# Patient Record
Sex: Male | Born: 1935 | Race: White | Hispanic: No | Smoking: Former smoker
Health system: Southern US, Community
[De-identification: ages and names within clinical notes are randomized; demographics above are authoritative.]

## PROBLEM LIST (undated history)

## (undated) DIAGNOSIS — IMO0002 Reserved for concepts with insufficient information to code with codable children: Secondary | ICD-10-CM

## (undated) DIAGNOSIS — I679 Cerebrovascular disease, unspecified: Secondary | ICD-10-CM

## (undated) DIAGNOSIS — I255 Ischemic cardiomyopathy: Secondary | ICD-10-CM

## (undated) DIAGNOSIS — E785 Hyperlipidemia, unspecified: Secondary | ICD-10-CM

## (undated) DIAGNOSIS — C8330 Diffuse large B-cell lymphoma, unspecified site: Secondary | ICD-10-CM

## (undated) DIAGNOSIS — F0390 Unspecified dementia without behavioral disturbance: Secondary | ICD-10-CM

## (undated) DIAGNOSIS — E7800 Pure hypercholesterolemia, unspecified: Secondary | ICD-10-CM

## (undated) DIAGNOSIS — I5022 Chronic systolic (congestive) heart failure: Secondary | ICD-10-CM

## (undated) DIAGNOSIS — R7301 Impaired fasting glucose: Secondary | ICD-10-CM

## (undated) DIAGNOSIS — F17201 Nicotine dependence, unspecified, in remission: Secondary | ICD-10-CM

## (undated) DIAGNOSIS — I739 Peripheral vascular disease, unspecified: Secondary | ICD-10-CM

## (undated) DIAGNOSIS — I119 Hypertensive heart disease without heart failure: Secondary | ICD-10-CM

## (undated) DIAGNOSIS — J449 Chronic obstructive pulmonary disease, unspecified: Secondary | ICD-10-CM

## (undated) DIAGNOSIS — M5030 Other cervical disc degeneration, unspecified cervical region: Secondary | ICD-10-CM

## (undated) DIAGNOSIS — I4891 Unspecified atrial fibrillation: Secondary | ICD-10-CM

## (undated) DIAGNOSIS — S72001A Fracture of unspecified part of neck of right femur, initial encounter for closed fracture: Secondary | ICD-10-CM

## (undated) DIAGNOSIS — I2510 Atherosclerotic heart disease of native coronary artery without angina pectoris: Secondary | ICD-10-CM

## (undated) DIAGNOSIS — M791 Myalgia, unspecified site: Secondary | ICD-10-CM

## (undated) HISTORY — DX: Diffuse large B-cell lymphoma, unspecified site: C83.30

## (undated) HISTORY — DX: Peripheral vascular disease, unspecified: I73.9

## (undated) HISTORY — DX: Impaired fasting glucose: R73.01

## (undated) HISTORY — PX: MOHS SURGERY: SHX181

## (undated) HISTORY — DX: Hyperlipidemia, unspecified: E78.5

## (undated) HISTORY — DX: Cerebrovascular disease, unspecified: I67.9

## (undated) HISTORY — DX: Myalgia, unspecified site: M79.10

## (undated) HISTORY — DX: Other cervical disc degeneration, unspecified cervical region: M50.30

## (undated) HISTORY — PX: OTHER SURGICAL HISTORY: SHX169

## (undated) HISTORY — DX: Unspecified dementia, unspecified severity, without behavioral disturbance, psychotic disturbance, mood disturbance, and anxiety: F03.90

## (undated) HISTORY — DX: Atherosclerotic heart disease of native coronary artery without angina pectoris: I25.10

## (undated) HISTORY — DX: Chronic obstructive pulmonary disease, unspecified: J44.9

## (undated) HISTORY — DX: Unspecified atrial fibrillation: I48.91

## (undated) HISTORY — DX: Reserved for concepts with insufficient information to code with codable children: IMO0002

## (undated) HISTORY — DX: Nicotine dependence, unspecified, in remission: F17.201

---

## 1994-06-12 HISTORY — PX: INGUINAL HERNIA REPAIR: SHX194

## 1994-06-12 HISTORY — PX: ORIF FEMUR FRACTURE: SHX2119

## 1998-12-21 ENCOUNTER — Encounter: Payer: Self-pay | Admitting: Neurological Surgery

## 1998-12-21 ENCOUNTER — Ambulatory Visit (HOSPITAL_COMMUNITY): Admission: RE | Admit: 1998-12-21 | Discharge: 1998-12-21 | Payer: Self-pay | Admitting: Neurological Surgery

## 2000-09-29 ENCOUNTER — Emergency Department (HOSPITAL_COMMUNITY): Admission: EM | Admit: 2000-09-29 | Discharge: 2000-09-29 | Payer: Self-pay | Admitting: Emergency Medicine

## 2000-09-29 ENCOUNTER — Encounter: Payer: Self-pay | Admitting: Internal Medicine

## 2000-10-01 ENCOUNTER — Ambulatory Visit (HOSPITAL_COMMUNITY): Admission: RE | Admit: 2000-10-01 | Discharge: 2000-10-01 | Payer: Self-pay | Admitting: Internal Medicine

## 2000-10-01 ENCOUNTER — Encounter: Payer: Self-pay | Admitting: Internal Medicine

## 2000-10-02 ENCOUNTER — Encounter: Payer: Self-pay | Admitting: Internal Medicine

## 2000-10-02 ENCOUNTER — Ambulatory Visit (HOSPITAL_COMMUNITY): Admission: RE | Admit: 2000-10-02 | Discharge: 2000-10-02 | Payer: Self-pay | Admitting: Internal Medicine

## 2000-10-18 ENCOUNTER — Encounter: Payer: Self-pay | Admitting: Internal Medicine

## 2000-10-18 ENCOUNTER — Ambulatory Visit (HOSPITAL_COMMUNITY): Admission: RE | Admit: 2000-10-18 | Discharge: 2000-10-18 | Payer: Self-pay | Admitting: Internal Medicine

## 2001-02-26 ENCOUNTER — Ambulatory Visit (HOSPITAL_COMMUNITY): Admission: RE | Admit: 2001-02-26 | Discharge: 2001-02-26 | Payer: Self-pay | Admitting: Family Medicine

## 2001-02-26 ENCOUNTER — Encounter: Payer: Self-pay | Admitting: Family Medicine

## 2001-03-28 ENCOUNTER — Emergency Department (HOSPITAL_COMMUNITY): Admission: EM | Admit: 2001-03-28 | Discharge: 2001-03-29 | Payer: Self-pay | Admitting: Internal Medicine

## 2001-03-28 ENCOUNTER — Encounter: Payer: Self-pay | Admitting: Internal Medicine

## 2001-03-29 ENCOUNTER — Encounter: Payer: Self-pay | Admitting: Internal Medicine

## 2001-04-16 ENCOUNTER — Emergency Department (HOSPITAL_COMMUNITY): Admission: EM | Admit: 2001-04-16 | Discharge: 2001-04-16 | Payer: Self-pay | Admitting: Emergency Medicine

## 2001-05-21 ENCOUNTER — Ambulatory Visit (HOSPITAL_COMMUNITY): Admission: RE | Admit: 2001-05-21 | Discharge: 2001-05-21 | Payer: Self-pay | Admitting: Cardiology

## 2001-06-12 HISTORY — PX: ANTERIOR FUSION CERVICAL SPINE: SUR626

## 2001-06-21 ENCOUNTER — Ambulatory Visit (HOSPITAL_COMMUNITY): Admission: RE | Admit: 2001-06-21 | Discharge: 2001-06-21 | Payer: Self-pay | Admitting: *Deleted

## 2001-08-02 ENCOUNTER — Encounter: Payer: Self-pay | Admitting: Neurosurgery

## 2001-08-02 ENCOUNTER — Inpatient Hospital Stay (HOSPITAL_COMMUNITY): Admission: RE | Admit: 2001-08-02 | Discharge: 2001-08-06 | Payer: Self-pay | Admitting: Neurosurgery

## 2001-10-14 ENCOUNTER — Emergency Department (HOSPITAL_COMMUNITY): Admission: EM | Admit: 2001-10-14 | Discharge: 2001-10-15 | Payer: Self-pay | Admitting: *Deleted

## 2001-10-14 ENCOUNTER — Encounter: Payer: Self-pay | Admitting: *Deleted

## 2001-10-18 ENCOUNTER — Ambulatory Visit (HOSPITAL_COMMUNITY): Admission: RE | Admit: 2001-10-18 | Discharge: 2001-10-18 | Payer: Self-pay | Admitting: Internal Medicine

## 2001-10-18 ENCOUNTER — Encounter: Payer: Self-pay | Admitting: Internal Medicine

## 2001-10-29 ENCOUNTER — Ambulatory Visit (HOSPITAL_COMMUNITY): Admission: RE | Admit: 2001-10-29 | Discharge: 2001-10-29 | Payer: Self-pay | Admitting: Internal Medicine

## 2001-11-20 ENCOUNTER — Encounter (HOSPITAL_COMMUNITY): Admission: RE | Admit: 2001-11-20 | Discharge: 2001-12-20 | Payer: Self-pay | Admitting: Neurology

## 2001-12-20 ENCOUNTER — Ambulatory Visit (HOSPITAL_COMMUNITY): Admission: RE | Admit: 2001-12-20 | Discharge: 2001-12-20 | Payer: Self-pay | Admitting: Neurology

## 2004-07-01 ENCOUNTER — Ambulatory Visit: Payer: Self-pay | Admitting: Cardiology

## 2004-11-17 ENCOUNTER — Ambulatory Visit (HOSPITAL_COMMUNITY): Admission: RE | Admit: 2004-11-17 | Discharge: 2004-11-17 | Payer: Self-pay | Admitting: Family Medicine

## 2004-12-27 ENCOUNTER — Ambulatory Visit (HOSPITAL_COMMUNITY): Admission: RE | Admit: 2004-12-27 | Discharge: 2004-12-27 | Payer: Self-pay | Admitting: Family Medicine

## 2005-01-09 ENCOUNTER — Ambulatory Visit (HOSPITAL_COMMUNITY): Admission: RE | Admit: 2005-01-09 | Discharge: 2005-01-09 | Payer: Self-pay | Admitting: Family Medicine

## 2005-01-10 ENCOUNTER — Ambulatory Visit (HOSPITAL_COMMUNITY): Payer: Self-pay | Admitting: Oncology

## 2005-01-10 ENCOUNTER — Encounter (HOSPITAL_COMMUNITY): Admission: RE | Admit: 2005-01-10 | Discharge: 2005-02-09 | Payer: Self-pay | Admitting: Oncology

## 2005-01-10 ENCOUNTER — Encounter: Admission: RE | Admit: 2005-01-10 | Discharge: 2005-01-10 | Payer: Self-pay | Admitting: Oncology

## 2005-01-13 ENCOUNTER — Encounter (HOSPITAL_COMMUNITY): Admission: RE | Admit: 2005-01-13 | Discharge: 2005-01-14 | Payer: Self-pay | Admitting: General Surgery

## 2005-01-13 ENCOUNTER — Ambulatory Visit (HOSPITAL_COMMUNITY): Payer: Self-pay | Admitting: General Surgery

## 2005-01-16 ENCOUNTER — Ambulatory Visit (HOSPITAL_COMMUNITY): Admission: RE | Admit: 2005-01-16 | Discharge: 2005-01-16 | Payer: Self-pay | Admitting: Oncology

## 2005-01-16 ENCOUNTER — Ambulatory Visit (HOSPITAL_COMMUNITY): Admission: RE | Admit: 2005-01-16 | Discharge: 2005-01-16 | Payer: Self-pay | Admitting: General Surgery

## 2005-01-16 ENCOUNTER — Encounter (INDEPENDENT_AMBULATORY_CARE_PROVIDER_SITE_OTHER): Payer: Self-pay | Admitting: General Surgery

## 2005-01-17 ENCOUNTER — Inpatient Hospital Stay (HOSPITAL_COMMUNITY): Admission: EM | Admit: 2005-01-17 | Discharge: 2005-01-24 | Payer: Self-pay | Admitting: Emergency Medicine

## 2005-02-05 ENCOUNTER — Inpatient Hospital Stay (HOSPITAL_COMMUNITY): Admission: EM | Admit: 2005-02-05 | Discharge: 2005-02-08 | Payer: Self-pay | Admitting: Emergency Medicine

## 2005-02-07 ENCOUNTER — Ambulatory Visit: Payer: Self-pay | Admitting: Oncology

## 2005-02-20 ENCOUNTER — Encounter: Admission: RE | Admit: 2005-02-20 | Discharge: 2005-03-11 | Payer: Self-pay | Admitting: Oncology

## 2005-02-20 ENCOUNTER — Encounter (HOSPITAL_COMMUNITY): Admission: RE | Admit: 2005-02-20 | Discharge: 2005-03-11 | Payer: Self-pay | Admitting: Oncology

## 2005-03-07 ENCOUNTER — Ambulatory Visit (HOSPITAL_COMMUNITY): Payer: Self-pay | Admitting: Oncology

## 2005-03-13 ENCOUNTER — Encounter: Admission: RE | Admit: 2005-03-13 | Discharge: 2005-03-13 | Payer: Self-pay | Admitting: Oncology

## 2005-03-13 ENCOUNTER — Encounter (HOSPITAL_COMMUNITY): Admission: RE | Admit: 2005-03-13 | Discharge: 2005-04-12 | Payer: Self-pay | Admitting: Oncology

## 2005-03-21 ENCOUNTER — Ambulatory Visit (HOSPITAL_COMMUNITY): Admission: RE | Admit: 2005-03-21 | Discharge: 2005-03-21 | Payer: Self-pay | Admitting: Oncology

## 2005-03-28 ENCOUNTER — Encounter (HOSPITAL_COMMUNITY): Payer: Self-pay | Admitting: Oncology

## 2005-04-18 ENCOUNTER — Encounter (HOSPITAL_COMMUNITY): Admission: RE | Admit: 2005-04-18 | Discharge: 2005-05-18 | Payer: Self-pay | Admitting: Oncology

## 2005-04-18 ENCOUNTER — Encounter: Admission: RE | Admit: 2005-04-18 | Discharge: 2005-04-18 | Payer: Self-pay | Admitting: Oncology

## 2005-04-24 ENCOUNTER — Ambulatory Visit (HOSPITAL_COMMUNITY): Payer: Self-pay | Admitting: Oncology

## 2005-05-02 ENCOUNTER — Inpatient Hospital Stay (HOSPITAL_COMMUNITY): Admission: EM | Admit: 2005-05-02 | Discharge: 2005-05-09 | Payer: Self-pay | Admitting: Emergency Medicine

## 2005-05-31 ENCOUNTER — Encounter: Admission: RE | Admit: 2005-05-31 | Discharge: 2005-06-02 | Payer: Self-pay | Admitting: Oncology

## 2005-05-31 ENCOUNTER — Encounter (HOSPITAL_COMMUNITY): Admission: RE | Admit: 2005-05-31 | Discharge: 2005-06-02 | Payer: Self-pay | Admitting: Oncology

## 2005-06-09 ENCOUNTER — Ambulatory Visit (HOSPITAL_COMMUNITY): Payer: Self-pay | Admitting: Oncology

## 2005-06-13 ENCOUNTER — Encounter: Admission: RE | Admit: 2005-06-13 | Discharge: 2005-06-13 | Payer: Self-pay | Admitting: Oncology

## 2005-06-13 ENCOUNTER — Encounter (HOSPITAL_COMMUNITY): Admission: RE | Admit: 2005-06-13 | Discharge: 2005-07-13 | Payer: Self-pay | Admitting: Oncology

## 2005-07-20 ENCOUNTER — Ambulatory Visit (HOSPITAL_COMMUNITY): Admission: RE | Admit: 2005-07-20 | Discharge: 2005-07-20 | Payer: Self-pay | Admitting: Otolaryngology

## 2005-08-03 ENCOUNTER — Ambulatory Visit (HOSPITAL_COMMUNITY): Admission: RE | Admit: 2005-08-03 | Discharge: 2005-08-03 | Payer: Self-pay | Admitting: Family Medicine

## 2005-08-23 ENCOUNTER — Encounter (HOSPITAL_COMMUNITY): Admission: RE | Admit: 2005-08-23 | Discharge: 2005-09-22 | Payer: Self-pay | Admitting: Family Medicine

## 2005-10-10 ENCOUNTER — Ambulatory Visit (HOSPITAL_COMMUNITY): Payer: Self-pay | Admitting: Oncology

## 2005-10-10 ENCOUNTER — Encounter (HOSPITAL_COMMUNITY): Admission: RE | Admit: 2005-10-10 | Discharge: 2005-11-09 | Payer: Self-pay | Admitting: Oncology

## 2005-10-10 ENCOUNTER — Encounter: Admission: RE | Admit: 2005-10-10 | Discharge: 2005-10-10 | Payer: Self-pay | Admitting: Oncology

## 2005-10-25 ENCOUNTER — Ambulatory Visit (HOSPITAL_COMMUNITY): Admission: RE | Admit: 2005-10-25 | Discharge: 2005-10-25 | Payer: Self-pay | Admitting: Oncology

## 2005-11-09 ENCOUNTER — Ambulatory Visit: Admission: RE | Admit: 2005-11-09 | Discharge: 2006-01-09 | Payer: Self-pay | Admitting: *Deleted

## 2005-11-14 ENCOUNTER — Encounter: Admission: RE | Admit: 2005-11-14 | Discharge: 2005-11-14 | Payer: Self-pay | Admitting: Oncology

## 2005-11-14 ENCOUNTER — Encounter (HOSPITAL_COMMUNITY): Admission: RE | Admit: 2005-11-14 | Discharge: 2005-12-14 | Payer: Self-pay | Admitting: Oncology

## 2005-11-29 ENCOUNTER — Ambulatory Visit (HOSPITAL_COMMUNITY): Payer: Self-pay | Admitting: Oncology

## 2005-12-27 ENCOUNTER — Encounter: Admission: RE | Admit: 2005-12-27 | Discharge: 2005-12-27 | Payer: Self-pay | Admitting: Oncology

## 2005-12-27 ENCOUNTER — Encounter (HOSPITAL_COMMUNITY): Admission: RE | Admit: 2005-12-27 | Discharge: 2006-01-26 | Payer: Self-pay | Admitting: Oncology

## 2006-02-15 ENCOUNTER — Encounter (HOSPITAL_COMMUNITY): Admission: RE | Admit: 2006-02-15 | Discharge: 2006-03-09 | Payer: Self-pay | Admitting: Oncology

## 2006-02-15 ENCOUNTER — Ambulatory Visit (HOSPITAL_COMMUNITY): Payer: Self-pay | Admitting: Oncology

## 2006-02-15 ENCOUNTER — Encounter: Admission: RE | Admit: 2006-02-15 | Discharge: 2006-03-09 | Payer: Self-pay | Admitting: Oncology

## 2006-04-26 ENCOUNTER — Encounter (HOSPITAL_COMMUNITY): Admission: RE | Admit: 2006-04-26 | Discharge: 2006-05-26 | Payer: Self-pay | Admitting: Oncology

## 2006-04-26 ENCOUNTER — Ambulatory Visit (HOSPITAL_COMMUNITY): Payer: Self-pay | Admitting: Oncology

## 2006-04-30 ENCOUNTER — Ambulatory Visit (HOSPITAL_COMMUNITY): Admission: RE | Admit: 2006-04-30 | Discharge: 2006-04-30 | Payer: Self-pay | Admitting: Oncology

## 2006-06-07 ENCOUNTER — Encounter (HOSPITAL_COMMUNITY): Admission: RE | Admit: 2006-06-07 | Discharge: 2006-07-07 | Payer: Self-pay | Admitting: Oncology

## 2006-07-05 ENCOUNTER — Ambulatory Visit (HOSPITAL_COMMUNITY): Payer: Self-pay | Admitting: Oncology

## 2006-07-05 ENCOUNTER — Encounter (HOSPITAL_COMMUNITY): Admission: RE | Admit: 2006-07-05 | Discharge: 2006-08-04 | Payer: Self-pay | Admitting: Oncology

## 2006-08-03 ENCOUNTER — Ambulatory Visit (HOSPITAL_COMMUNITY): Admission: RE | Admit: 2006-08-03 | Discharge: 2006-08-03 | Payer: Self-pay | Admitting: Oncology

## 2006-08-13 ENCOUNTER — Encounter (HOSPITAL_COMMUNITY): Admission: RE | Admit: 2006-08-13 | Discharge: 2006-09-12 | Payer: Self-pay | Admitting: Oncology

## 2006-08-17 ENCOUNTER — Ambulatory Visit: Payer: Self-pay | Admitting: Cardiology

## 2006-08-29 ENCOUNTER — Ambulatory Visit: Payer: Self-pay | Admitting: Cardiology

## 2006-08-29 ENCOUNTER — Ambulatory Visit (HOSPITAL_COMMUNITY): Admission: RE | Admit: 2006-08-29 | Discharge: 2006-08-29 | Payer: Self-pay | Admitting: Cardiology

## 2006-09-13 ENCOUNTER — Ambulatory Visit: Payer: Self-pay | Admitting: Cardiology

## 2006-09-13 ENCOUNTER — Encounter (HOSPITAL_COMMUNITY): Admission: RE | Admit: 2006-09-13 | Discharge: 2006-10-13 | Payer: Self-pay | Admitting: Cardiology

## 2006-09-17 ENCOUNTER — Ambulatory Visit (HOSPITAL_COMMUNITY): Payer: Self-pay | Admitting: Oncology

## 2006-09-17 ENCOUNTER — Encounter (HOSPITAL_COMMUNITY): Admission: RE | Admit: 2006-09-17 | Discharge: 2006-10-17 | Payer: Self-pay | Admitting: Oncology

## 2006-09-19 ENCOUNTER — Ambulatory Visit: Payer: Self-pay | Admitting: Cardiology

## 2006-10-22 ENCOUNTER — Emergency Department (HOSPITAL_COMMUNITY): Admission: EM | Admit: 2006-10-22 | Discharge: 2006-10-22 | Payer: Self-pay | Admitting: Emergency Medicine

## 2006-11-02 ENCOUNTER — Encounter (HOSPITAL_COMMUNITY): Admission: RE | Admit: 2006-11-02 | Discharge: 2006-12-02 | Payer: Self-pay | Admitting: Oncology

## 2006-12-03 ENCOUNTER — Ambulatory Visit (HOSPITAL_COMMUNITY): Payer: Self-pay | Admitting: Oncology

## 2006-12-03 ENCOUNTER — Ambulatory Visit (HOSPITAL_COMMUNITY): Admission: RE | Admit: 2006-12-03 | Discharge: 2006-12-03 | Payer: Self-pay | Admitting: Family Medicine

## 2006-12-10 ENCOUNTER — Encounter (HOSPITAL_COMMUNITY): Admission: RE | Admit: 2006-12-10 | Discharge: 2007-01-09 | Payer: Self-pay | Admitting: Oncology

## 2007-02-04 ENCOUNTER — Ambulatory Visit (HOSPITAL_COMMUNITY): Payer: Self-pay | Admitting: Oncology

## 2007-02-04 ENCOUNTER — Encounter (HOSPITAL_COMMUNITY): Admission: RE | Admit: 2007-02-04 | Discharge: 2007-03-06 | Payer: Self-pay | Admitting: Oncology

## 2007-04-02 ENCOUNTER — Encounter (HOSPITAL_COMMUNITY): Admission: RE | Admit: 2007-04-02 | Discharge: 2007-05-02 | Payer: Self-pay | Admitting: Oncology

## 2007-04-30 ENCOUNTER — Ambulatory Visit (HOSPITAL_COMMUNITY): Payer: Self-pay | Admitting: Oncology

## 2007-05-28 ENCOUNTER — Encounter (HOSPITAL_COMMUNITY): Admission: RE | Admit: 2007-05-28 | Discharge: 2007-06-12 | Payer: Self-pay | Admitting: Oncology

## 2007-06-25 ENCOUNTER — Ambulatory Visit (HOSPITAL_COMMUNITY): Admission: RE | Admit: 2007-06-25 | Discharge: 2007-06-25 | Payer: Self-pay | Admitting: Oncology

## 2007-06-26 ENCOUNTER — Ambulatory Visit (HOSPITAL_COMMUNITY): Payer: Self-pay | Admitting: Oncology

## 2007-06-26 ENCOUNTER — Encounter (HOSPITAL_COMMUNITY): Admission: RE | Admit: 2007-06-26 | Discharge: 2007-07-26 | Payer: Self-pay | Admitting: Oncology

## 2007-08-13 ENCOUNTER — Ambulatory Visit (HOSPITAL_COMMUNITY): Payer: Self-pay | Admitting: Oncology

## 2007-10-03 ENCOUNTER — Encounter (HOSPITAL_COMMUNITY): Admission: RE | Admit: 2007-10-03 | Discharge: 2007-11-02 | Payer: Self-pay | Admitting: Oncology

## 2007-10-03 ENCOUNTER — Ambulatory Visit (HOSPITAL_COMMUNITY): Payer: Self-pay | Admitting: Oncology

## 2007-10-09 ENCOUNTER — Ambulatory Visit: Payer: Self-pay | Admitting: Cardiology

## 2007-10-14 ENCOUNTER — Ambulatory Visit (HOSPITAL_COMMUNITY): Admission: RE | Admit: 2007-10-14 | Discharge: 2007-10-14 | Payer: Self-pay | Admitting: Cardiology

## 2007-10-14 ENCOUNTER — Encounter (INDEPENDENT_AMBULATORY_CARE_PROVIDER_SITE_OTHER): Payer: Self-pay | Admitting: *Deleted

## 2007-10-14 LAB — CONVERTED CEMR LAB
Cholesterol: 137 mg/dL
LDL Cholesterol: 83 mg/dL
Triglycerides: 117 mg/dL

## 2007-11-14 ENCOUNTER — Encounter (HOSPITAL_COMMUNITY): Admission: RE | Admit: 2007-11-14 | Discharge: 2007-12-14 | Payer: Self-pay | Admitting: Oncology

## 2007-11-19 ENCOUNTER — Ambulatory Visit (HOSPITAL_COMMUNITY): Payer: Self-pay | Admitting: Oncology

## 2007-12-26 ENCOUNTER — Encounter (HOSPITAL_COMMUNITY): Admission: RE | Admit: 2007-12-26 | Discharge: 2008-01-25 | Payer: Self-pay | Admitting: Oncology

## 2008-01-15 ENCOUNTER — Ambulatory Visit (HOSPITAL_COMMUNITY): Admission: RE | Admit: 2008-01-15 | Discharge: 2008-01-15 | Payer: Self-pay | Admitting: Oncology

## 2008-01-20 ENCOUNTER — Ambulatory Visit (HOSPITAL_COMMUNITY): Payer: Self-pay | Admitting: Oncology

## 2008-02-05 ENCOUNTER — Ambulatory Visit (HOSPITAL_COMMUNITY): Admission: RE | Admit: 2008-02-05 | Discharge: 2008-02-05 | Payer: Self-pay | Admitting: General Surgery

## 2008-02-05 ENCOUNTER — Encounter (INDEPENDENT_AMBULATORY_CARE_PROVIDER_SITE_OTHER): Payer: Self-pay | Admitting: General Surgery

## 2008-03-19 ENCOUNTER — Ambulatory Visit (HOSPITAL_COMMUNITY): Payer: Self-pay | Admitting: Oncology

## 2008-05-19 ENCOUNTER — Ambulatory Visit (HOSPITAL_COMMUNITY): Payer: Self-pay | Admitting: Oncology

## 2008-05-19 ENCOUNTER — Encounter (HOSPITAL_COMMUNITY): Admission: RE | Admit: 2008-05-19 | Discharge: 2008-06-18 | Payer: Self-pay | Admitting: Oncology

## 2008-08-11 ENCOUNTER — Ambulatory Visit (HOSPITAL_COMMUNITY): Payer: Self-pay | Admitting: Oncology

## 2008-10-08 ENCOUNTER — Ambulatory Visit: Payer: Self-pay | Admitting: Cardiology

## 2008-10-12 ENCOUNTER — Ambulatory Visit (HOSPITAL_COMMUNITY): Admission: RE | Admit: 2008-10-12 | Discharge: 2008-10-12 | Payer: Self-pay | Admitting: Cardiology

## 2008-11-03 ENCOUNTER — Encounter (HOSPITAL_COMMUNITY): Admission: RE | Admit: 2008-11-03 | Discharge: 2008-12-03 | Payer: Self-pay | Admitting: Oncology

## 2008-11-03 ENCOUNTER — Ambulatory Visit (HOSPITAL_COMMUNITY): Payer: Self-pay | Admitting: Oncology

## 2008-11-03 DIAGNOSIS — J4489 Other specified chronic obstructive pulmonary disease: Secondary | ICD-10-CM | POA: Insufficient documentation

## 2008-11-03 DIAGNOSIS — I10 Essential (primary) hypertension: Secondary | ICD-10-CM

## 2008-11-03 DIAGNOSIS — J449 Chronic obstructive pulmonary disease, unspecified: Secondary | ICD-10-CM | POA: Insufficient documentation

## 2008-11-05 ENCOUNTER — Ambulatory Visit: Payer: Self-pay | Admitting: Cardiology

## 2008-11-05 ENCOUNTER — Encounter: Payer: Self-pay | Admitting: Cardiology

## 2008-12-22 ENCOUNTER — Ambulatory Visit (HOSPITAL_COMMUNITY): Payer: Self-pay | Admitting: Oncology

## 2009-02-02 ENCOUNTER — Encounter (HOSPITAL_COMMUNITY): Admission: RE | Admit: 2009-02-02 | Discharge: 2009-03-04 | Payer: Self-pay | Admitting: Oncology

## 2009-03-16 ENCOUNTER — Encounter (HOSPITAL_COMMUNITY): Admission: RE | Admit: 2009-03-16 | Discharge: 2009-04-15 | Payer: Self-pay | Admitting: Oncology

## 2009-03-16 ENCOUNTER — Ambulatory Visit (HOSPITAL_COMMUNITY): Payer: Self-pay | Admitting: Oncology

## 2009-05-09 ENCOUNTER — Observation Stay (HOSPITAL_COMMUNITY): Admission: EM | Admit: 2009-05-09 | Discharge: 2009-05-10 | Payer: Self-pay | Admitting: Emergency Medicine

## 2009-05-13 ENCOUNTER — Encounter (HOSPITAL_COMMUNITY): Admission: RE | Admit: 2009-05-13 | Discharge: 2009-06-09 | Payer: Self-pay | Admitting: Cardiology

## 2009-05-13 ENCOUNTER — Ambulatory Visit: Payer: Self-pay | Admitting: Cardiology

## 2009-05-17 ENCOUNTER — Encounter (INDEPENDENT_AMBULATORY_CARE_PROVIDER_SITE_OTHER): Payer: Self-pay | Admitting: *Deleted

## 2009-05-21 ENCOUNTER — Encounter (INDEPENDENT_AMBULATORY_CARE_PROVIDER_SITE_OTHER): Payer: Self-pay | Admitting: *Deleted

## 2009-05-25 ENCOUNTER — Encounter (INDEPENDENT_AMBULATORY_CARE_PROVIDER_SITE_OTHER): Payer: Self-pay | Admitting: *Deleted

## 2009-05-25 ENCOUNTER — Encounter: Payer: Self-pay | Admitting: Adult Health

## 2009-05-25 ENCOUNTER — Ambulatory Visit: Payer: Self-pay | Admitting: Cardiology

## 2009-05-25 LAB — CONVERTED CEMR LAB
CO2: 29 meq/L (ref 19–32)
Calcium: 9 mg/dL (ref 8.4–10.5)
Creatinine, Ser: 1.07 mg/dL (ref 0.40–1.50)
INR: 0.93 (ref <1.50)
Lymphocytes Relative: 27 % (ref 12–46)
Lymphs Abs: 1.9 10*3/uL (ref 0.7–4.0)
Monocytes Relative: 8 % (ref 3–12)
Neutro Abs: 4 10*3/uL (ref 1.7–7.7)
Neutrophils Relative %: 58 % (ref 43–77)
Platelets: 189 10*3/uL (ref 150–400)
Prothrombin Time: 12.4 s (ref 11.6–15.2)
RBC: 4.19 M/uL — ABNORMAL LOW (ref 4.22–5.81)
WBC: 6.9 10*3/uL (ref 4.0–10.5)
aPTT: 29 s (ref 24–37)

## 2009-05-28 ENCOUNTER — Encounter (INDEPENDENT_AMBULATORY_CARE_PROVIDER_SITE_OTHER): Payer: Self-pay | Admitting: *Deleted

## 2009-05-28 ENCOUNTER — Emergency Department (HOSPITAL_COMMUNITY): Admission: EM | Admit: 2009-05-28 | Discharge: 2009-05-28 | Payer: Self-pay | Admitting: Emergency Medicine

## 2009-05-28 LAB — CONVERTED CEMR LAB
Calcium: 9 mg/dL
Chloride: 102 meq/L
Creatinine, Ser: 1.07 mg/dL
Sodium: 138 meq/L

## 2009-05-31 ENCOUNTER — Telehealth (INDEPENDENT_AMBULATORY_CARE_PROVIDER_SITE_OTHER): Payer: Self-pay | Admitting: *Deleted

## 2009-06-01 ENCOUNTER — Encounter (INDEPENDENT_AMBULATORY_CARE_PROVIDER_SITE_OTHER): Payer: Self-pay | Admitting: *Deleted

## 2009-06-02 ENCOUNTER — Ambulatory Visit: Payer: Self-pay | Admitting: Cardiovascular Disease

## 2009-06-02 ENCOUNTER — Inpatient Hospital Stay (HOSPITAL_BASED_OUTPATIENT_CLINIC_OR_DEPARTMENT_OTHER): Admission: RE | Admit: 2009-06-02 | Discharge: 2009-06-02 | Payer: Self-pay | Admitting: Cardiovascular Disease

## 2009-06-08 ENCOUNTER — Encounter (HOSPITAL_COMMUNITY): Admission: RE | Admit: 2009-06-08 | Discharge: 2009-06-11 | Payer: Self-pay | Admitting: Oncology

## 2009-06-08 ENCOUNTER — Ambulatory Visit (HOSPITAL_COMMUNITY): Payer: Self-pay | Admitting: Oncology

## 2009-06-08 ENCOUNTER — Encounter (INDEPENDENT_AMBULATORY_CARE_PROVIDER_SITE_OTHER): Payer: Self-pay | Admitting: *Deleted

## 2009-06-08 LAB — CONVERTED CEMR LAB
ALT: 13 U/L
AST: 16 U/L
Albumin: 3.8 g/dL
Alkaline Phosphatase: 54 U/L
BUN: 11 mg/dL
CO2: 28 meq/L
Calcium: 8.6 mg/dL
Chloride: 104 meq/L
Creatinine, Ser: 1 mg/dL
GFR calc non Af Amer: >60 mL/min
Glomerular Filtration Rate, Af Am: >60 mL/min/{1.73_m2}
Glucose, Bld: 112 mg/dL
LDL Cholesterol: 157 mg/dL
Potassium: 3.5 meq/L
Sodium: 139 meq/L
Total Protein: 6.9 g/dL

## 2009-06-18 ENCOUNTER — Encounter: Payer: Self-pay | Admitting: Adult Health

## 2009-06-18 ENCOUNTER — Encounter (INDEPENDENT_AMBULATORY_CARE_PROVIDER_SITE_OTHER): Payer: Self-pay | Admitting: *Deleted

## 2009-06-18 ENCOUNTER — Ambulatory Visit: Payer: Self-pay | Admitting: Cardiology

## 2009-08-04 ENCOUNTER — Encounter (HOSPITAL_COMMUNITY): Admission: RE | Admit: 2009-08-04 | Discharge: 2009-09-03 | Payer: Self-pay | Admitting: Oncology

## 2009-08-04 ENCOUNTER — Ambulatory Visit (HOSPITAL_COMMUNITY): Payer: Self-pay | Admitting: Oncology

## 2009-12-16 ENCOUNTER — Ambulatory Visit (HOSPITAL_COMMUNITY): Payer: Self-pay | Admitting: Oncology

## 2010-01-27 ENCOUNTER — Encounter (HOSPITAL_COMMUNITY): Admission: RE | Admit: 2010-01-27 | Discharge: 2010-02-26 | Payer: Self-pay | Admitting: Oncology

## 2010-01-27 LAB — CONVERTED CEMR LAB
ALT: 10 units/L
AST: 13 units/L
Glomerular Filtration Rate, Af Am: >60 mL/min/{1.73_m2}
HCT: 36.7 %
MCV: 95.2 fL
Potassium: 2.5 meq/L
Sodium: 143 meq/L

## 2010-03-10 ENCOUNTER — Ambulatory Visit (HOSPITAL_COMMUNITY): Payer: Self-pay | Admitting: Oncology

## 2010-03-18 ENCOUNTER — Ambulatory Visit: Payer: Self-pay | Admitting: Cardiology

## 2010-03-18 ENCOUNTER — Encounter (INDEPENDENT_AMBULATORY_CARE_PROVIDER_SITE_OTHER): Payer: Self-pay | Admitting: *Deleted

## 2010-03-18 DIAGNOSIS — M5030 Other cervical disc degeneration, unspecified cervical region: Secondary | ICD-10-CM

## 2010-03-18 DIAGNOSIS — F17201 Nicotine dependence, unspecified, in remission: Secondary | ICD-10-CM | POA: Insufficient documentation

## 2010-03-18 DIAGNOSIS — C8330 Diffuse large B-cell lymphoma, unspecified site: Secondary | ICD-10-CM | POA: Insufficient documentation

## 2010-03-18 DIAGNOSIS — I679 Cerebrovascular disease, unspecified: Secondary | ICD-10-CM | POA: Insufficient documentation

## 2010-04-11 ENCOUNTER — Encounter: Payer: Self-pay | Admitting: Cardiology

## 2010-04-28 ENCOUNTER — Ambulatory Visit (HOSPITAL_COMMUNITY): Payer: Self-pay | Admitting: Oncology

## 2010-04-28 ENCOUNTER — Encounter (HOSPITAL_COMMUNITY)
Admission: RE | Admit: 2010-04-28 | Discharge: 2010-05-28 | Payer: Self-pay | Source: Home / Self Care | Attending: Oncology | Admitting: Oncology

## 2010-05-16 ENCOUNTER — Emergency Department (HOSPITAL_COMMUNITY)
Admission: EM | Admit: 2010-05-16 | Discharge: 2010-05-16 | Payer: Self-pay | Source: Home / Self Care | Admitting: Emergency Medicine

## 2010-05-18 ENCOUNTER — Ambulatory Visit: Payer: Self-pay | Admitting: Cardiology

## 2010-06-09 ENCOUNTER — Encounter (HOSPITAL_COMMUNITY)
Admission: RE | Admit: 2010-06-09 | Discharge: 2010-07-09 | Payer: Self-pay | Source: Home / Self Care | Attending: Oncology | Admitting: Oncology

## 2010-07-02 ENCOUNTER — Encounter (HOSPITAL_COMMUNITY): Payer: Self-pay | Admitting: Oncology

## 2010-07-03 ENCOUNTER — Encounter (HOSPITAL_COMMUNITY): Payer: Self-pay | Admitting: Oncology

## 2010-07-03 ENCOUNTER — Encounter: Payer: Self-pay | Admitting: Family Medicine

## 2010-07-03 ENCOUNTER — Encounter: Payer: Self-pay | Admitting: Cardiology

## 2010-07-12 NOTE — Miscellaneous (Signed)
Summary: HOSP LABS 06/08/2009  Clinical Lists Changes  Observations: Added new observation of CALCIUM: 8.6 mg/dL (04/54/0981 1:91) Added new observation of ALBUMIN: 3.8 g/dL (47/82/9562 1:30) Added new observation of PROTEIN, TOT: 6.9 g/dL (86/57/8469 6:29) Added new observation of SGPT (ALT): 13 units/L (06/08/2009 9:32) Added new observation of SGOT (AST): 16 units/L (06/08/2009 9:32) Added new observation of ALK PHOS: 54 units/L (06/08/2009 9:32) Added new observation of GFR AA: >60 mL/min/1.39m2 (06/08/2009 9:32) Added new observation of GFR: >60 mL/min (06/08/2009 9:32) Added new observation of CREATININE: 1.00 mg/dL (52/84/1324 4:01) Added new observation of BUN: 11 mg/dL (02/72/5366 4:40) Added new observation of BG RANDOM: 112 mg/dL (34/74/2595 6:38) Added new observation of CO2 PLSM/SER: 28 meq/L (06/08/2009 9:32) Added new observation of CL SERUM: 104 meq/L (06/08/2009 9:32) Added new observation of K SERUM: 3.5 meq/L (06/08/2009 9:32) Added new observation of NA: 139 meq/L (06/08/2009 9:32) Added new observation of LDL: 157 mg/dL (75/64/3329 5:18) Added new observation of CALCIUM: 9.0 mg/dL (84/16/6063 0:16) Added new observation of GFR AA: >60 mL/min/1.65m2 (05/28/2009 9:32) Added new observation of GFR: >60 mL/min (05/28/2009 9:32) Added new observation of CREATININE: 1.07 mg/dL (06/20/3233 5:73) Added new observation of BUN: 18 mg/dL (22/07/5425 0:62) Added new observation of BG RANDOM: 120 mg/dL (37/62/8315 1:76) Added new observation of CO2 PLSM/SER: 27 meq/L (05/28/2009 9:32) Added new observation of CL SERUM: 102 meq/L (05/28/2009 9:32) Added new observation of K SERUM: 3.4 meq/L (05/28/2009 9:32) Added new observation of NA: 138 meq/L (05/28/2009 9:32)

## 2010-07-12 NOTE — Letter (Signed)
Summary: [place] [facility], [facility] at [employer]  [address] [place] [address]   Phone: [phone]  Fax: [phone]     March 18, 2010 MRN: [id number]   Curtis Lang [address] [place], [place]  [address]      YOUR LAB WORK IS DUE   September 19, 2010  Please go to [facility], located across the street from [facility] on the second floor.  Hours are Monday - Friday 7am until 7:30pm         Saturday 8am until 12noon    __  DO NOT EAT OR DRINK AFTER MIDNIGHT EVENING PRIOR TO LABWORK  _x_ YOUR LABWORK IS NOT FASTING --YOU MAY EAT PRIOR TO LABWORK

## 2010-07-12 NOTE — Assessment & Plan Note (Signed)
Summary: post cath per Aurea Graff in the [facility] lab/tg   Visit Type:  Follow-up Primary Provider:  Dr.golding   History of Present Illness: Curtis Lang is a very pleasant 77 y/o CM who we are seeing on follow-up after having outpatient cardiac catherization on 06/02/2009 by Dr. Clifton James.  This was done because of abnormal stress test.  He has a history of RCA stenosis with RCA stent in 1997, cutting balloon angioplasty in 2001 to same site.  The stress test showed possible inferior wall ischemia.  Catheterization revealed a totally occluded RCA at the mid portion at the site of the previously placed stent. The distal RCA, PDA and PL branch filled from left to right collaterals.  He was recommended continued medical therapy.  Since cath, he has been doing very well. Remains active for his age and health status.  Not limited by chest pain or SOB.  He has not had to use Ntg.  He is in good spirits and without complaint.  Review of labs prior to cath did not show any severe abnormals.  Preventive Screening-Counseling & Management  Alcohol-Tobacco     Alcohol drinks/day: 0     Smoking Status: quit  Current Medications (verified): 1)  Lisinopril 20 Mg Tabs (Lisinopril) .... Take One Tablet By Mouth Daily 2)  Furosemide 40 Mg Tabs (Furosemide) .... Take 1 Tablet By Mouth Once A Day 3)  Aspirin 81 Mg Tbec (Aspirin) .... Take One Tablet By Mouth Daily 4)  Simvastatin 40 Mg Tabs (Simvastatin) .... Take One Tablet By Mouth Daily At Bedtime 5)  Klor-Con M10 10 Meq Cr-Tabs (Potassium Chloride Crys Cr) .... Take 1 Tablet By Mouth Once A Day  Allergies (verified): No Known Drug Allergies PMH-FH-SH reviewed-no changes except otherwise noted  Social History: Alcohol drinks/day:  0  Review of Systems       All other systems have been reviewed and are negative unless stated above.   Vital Signs:  Patient profile:   77 year old male Weight:      171 pounds Pulse rate:   59 / minute BP sitting:   160 / 76   (right arm)  Vitals Entered By: Dreama Saa, CNA (June 18, 2009 1:41 PM)  Physical Exam  General:  Well developed, well nourished, in no acute distress. Lungs:  Clear bilaterally to auscultation and percussion. Heart:  Non-displaced PMI, chest non-tender; regular rate and rhythm, S1, S2 without murmurs, rubs or gallops. Carotid upstroke normal, no bruit. Normal abdominal aortic size, no bruits. Femorals normal pulses, no bruits. Pedals normal pulses. No edema, no varicosities. Abdomen:  Bowel sounds positive; abdomen soft and non-tender without masses, organomegaly, or hernias noted. No hepatosplenomegaly. Msk:  Back normal, normal gait. Muscle strength and tone normal. Extremities:  No clubbing or cyanosis. Psych:  Normal affect.   EKG  Procedure date:  06/18/2009  Findings:      Normal sinus rhythm with rate of: 60 bpm. First degree AV-Block noted.    Impression & Recommendations:  Problem # 1:  CAD, NATIVE VESSEL (ICD-414.01) Mr.  Curtis Lang cardiac cath revealed total occlusion of the RCA but L to R collaterals.  He is asymptomatic and we will continue to treat him medically. His updated medication list for this problem includes:    Lisinopril 20 Mg Tabs (Lisinopril) .Marland Kitchen... Take one tablet by mouth daily    Aspirin 81 Mg Tbec (Aspirin) .Marland Kitchen... Take one tablet by mouth daily  Problem # 2:  HYPERCHOLESTEROLEMIA, MIXED (ICD-272.0) Continue medications.  Will recheck lipids and LFTs in 6 months unless done by PCP. His updated medication list for this problem includes:    Simvastatin 40 Mg Tabs (Simvastatin) .Marland Kitchen... Take one tablet by mouth daily at bedtime  Problem # 3:  CAROTID ARTERY STENOSIS, BILATERAL (ICD-433.10) Assessment: Unchanged  His updated medication list for this problem includes:    Aspirin 81 Mg Tbec (Aspirin) .Marland Kitchen... Take one tablet by mouth daily  Patient Instructions: 1)  Your physician recommends that you schedule a follow-up appointment in:  6 months 2)  Your  physician has recommended you make the following change in your medication: micro K ( potassium) once daily Prescriptions: KLOR-CON M10 10 MEQ CR-TABS (POTASSIUM CHLORIDE CRYS CR) Take 1 tablet by mouth once a day  #30 x 6   Entered by:   Teressa Lower RN   Authorized by:   Joni Reining, NP   Signed by:   Teressa Lower RN on 06/18/2009   Method used:   Electronically to        [facility]* (retail)       [address]       [place], [place]  [address]       Ph: [phone]       Fax: [phone]   RxID:   [id number]

## 2010-07-12 NOTE — Letter (Signed)
Summary: BP LOG  BP LOG   Imported By: Faythe Ghee 04/11/2010 15:02:03  _____________________________________________________________________  External Attachment:    Type:   Image     Comment:   External Document

## 2010-07-12 NOTE — Assessment & Plan Note (Signed)
Summary: PAST DUE FOR 6 MTH F/U PER PT /TG   Visit Type:  Follow-up Referring Almendra Loria:  Neurosurgery-Roy; Oncology-Neijstrom Primary Dillyn Menna:  Dr. Dorthey Sawyer   History of Present Illness: Curtis Lang returns to the office for continued assessment and treatment of coronary artery disease.  Since his last visit, he has remained active without any cardiopulmonary symptoms.  He performs yard work and does repairs around his home.  He denies dyspnea, orthopnea, PND, lightheadedness, chest pain or syncope.  He continues to experience discomfort along the medial aspects of both thighs and both lower legs with exercise.  Prior to his treatment for lymphoma, he walked on a regular basis, but rarely does so now.       Current Medications (verified): 1)  Lisinopril 20 Mg Tabs (Lisinopril) .... Take One Tablet By Mouth Daily 2)  Furosemide 40 Mg Tabs (Furosemide) .... Take 1 Tablet By Mouth Once A Day 3)  Aspirin 81 Mg Tbec (Aspirin) .... Take One Tablet By Mouth Daily 4)  Simvastatin 40 Mg Tabs (Simvastatin) .... Take One Tablet By Mouth Daily At Bedtime 5)  Klor-Con M10 10 Meq Cr-Tabs (Potassium Chloride Crys Cr) .... Take 1 Tablet By Mouth Once A Day 6)  Advil 200 Mg Tabs (Ibuprofen) .... Take As Needed 7)  Amlodipine Besylate 5 Mg Tabs (Amlodipine Besylate) .... Take One Tablet By Mouth Daily  Allergies (verified): No Known Drug Allergies  Comments:  Nurse/Medical Assistant: patient brought med bottles and reviewed previous ov med list the only  med he didn't bring was his potassium and he stated he walked off and left it on the table at home also added advil to list he takes ir prn  Past History:  PMH, FH, and Social History reviewed and updated.  Past Medical History: ASCVD: RCA stent & PTCA of cc 1997; cutting balloon for RCA restenosis in 2001; cath in 05/2009-      100% RCA; L->R collaterals; normal EF; 40% LAD and OM1 Hypertension Hyperlipidemia Cerebrovascular  disease:bilateral bruits; duplex in 2009-mild plaque without stenosis Peripheral vascular disease-ABI of 0.87 on the left Lymphoma-chemotherapy and radiation therapy in 2006; recurrence in 2007 Squamous cell carcinoma of the skin-status post Mohs surgery Tobacco abuse-60 pack years discontinued in 2003 COPD (ICD-496) Fasting hyperglycemia DDD-cervical spine Myalgias-no response to discontinuation of statins  Past Surgical History: Cervical spine fusion-2003 Right inguinal herniorrhaphy-1996 ORIF of long bone injury of the left leg-1996 Mohs surgery for squamous cell carcinoma of the nose  Review of Systems       See history of present illness.  Vital Signs:  Patient profile:   77 year old male Weight:      171 pounds BMI:     28.56 Pulse rate:   57 / minute BP sitting:   143 / 69  (right arm)  Vitals Entered By: Dreama Saa, CNA (March 18, 2010 1:23 PM)  Cardiac Cath  Procedure date:  06/02/2009  Findings:      77 year old- bare-metal stent placed in the mid RCA in 1997 followed by cutting balloon angioplasty of this area in 2001.  Now with chest pain  Stress test on12/2/10- inferior wall ischemia.     Left ventricular end-diastolic pressure 22.   LMCA-20% stenosis.  LAD- long tubular 30% stenosis throughout the proximal       portion with moderate calcification; discrete 40% stenosis in the midportion of this vessel; distal discrete 65% stenosis      First diagonal was small with mild  plaque  CX-moderate-sized OM with 40% stenosis.  RCA-large dominant vessel with serial 50% lesions throughout the proximal portion and then 100% in the mid vessel at the site of the prior stent.  The distal vessel fills from left-to-right collaterals.  LV- mild inferior wall hypokinesis with ejection fraction of       55%.     IMPRESSION:   1. Single-vessel coronary artery disease.   2. Moderate nonobstructive disease in the left anterior descending       artery and in the  circumflex system.   3. Normal left ventricular systolic function.      RECOMMENDATIONS:  This patient has a total occlusion of the mid right   coronary artery; however, there are great collaterals to the distal   vessel.  There is just moderate plaque disease throughout the other   vessels.  I do not see any lesions that are amenable  to percutaneous   coronary intervention at this time.  We will continue medical   management.   Verne Carrow, MD     Physical Exam  General:  Mildly overweight; well developed; no acute distress:   Neck-No JVD; modest early systolic right carotid bruit Lungs-No tachypnea, no rales; no rhonchi; no wheezes: Cardiovascular-normal PMI; normal S1 and S2; grade 1-2 holosystolic apical murmur Abdomen-BS normal; soft and non-tender without masses or organomegaly; midline surgical scar related to procedure performed in infancy Musculoskeletal-No deformities, no cyanosis or clubbing: Neurologic-Normal cranial nerves; symmetric strength and tone:  Skin-Warm, no significant lesions: Extremities-1-2+ distal pulses; 1+ ankle edema:     Impression & Recommendations:  Problem # 1:  ATHEROSCLEROTIC CARDIOVASCULAR DISEASE (ICD-429.2) Patient is stable with respect to coronary disease.  Cardiac catheterization last year revealed favorable anatomy with total occlusion of the right coronary but good collateralization and no critical disease in the left system.  Problem # 2:  HYPERTENSION (ICD-401.9) Blood pressure control is slightly suboptimal.  Patient reports readings a home with systolics in the 140s and 150s.  Amlodipine 5 mg q.d. will be added to his medical regime.  Patient will monitor blood pressures at home and return for reassessment by the cardiology nurses in one month.  Problem # 3:  HYPERLIPIDEMIA (ICD-272.4) No recent assessment of lipids is available.  A fasting lipid profile will be obtained.  CHOL: 137 (10/14/2007)   LDL: 157 (06/08/2009)    HDL: 31 (10/14/2007)   TG: 117 (10/14/2007)  Problem # 4:  LYMPHOMA (ICD-202.80) Patient still has a right subcutaneous port, whose patency continues to be maintained.  He will discuss the advisability of removing this device at his next visit with Dr. Mariel Sleet.  I will plan to see this nice gentleman again in one year.  Other Orders: Future Orders: T-Lipid Profile ([id number]) ... 03/21/2010 T-Comprehensive Metabolic Panel [id number]) ... 03/21/2010 T-CBC w/Diff ([id number]) ... 03/21/2010 T-Basic Metabolic Panel [id number]) ... 09/19/2010  Patient Instructions: 1)  Your physician recommends that you schedule a follow-up appointment in: 1 year 2)  Your physician recommends that you return for lab work MW:UXLK week and in 6 months 3)  Your physician has recommended you make the following change in your medication: start amlodipine 5mg  daily 4)  You have been referred to nurse visti in 2 months, please bring bp diary to nurse visit 5)  Your physician has requested that you regularly monitor and record your blood pressure readings at home.  Please use the same machine at the same time of day to check your readings and record  them to bring to your follow-up visit. Prescriptions: SIMVASTATIN 40 MG TABS (SIMVASTATIN) Take one tablet by mouth daily at bedtime  #30 x 6   Entered by:   Teressa Lower RN   Authorized by:   Kathlen Brunswick, MD, [facility]   Signed by:   Teressa Lower RN on 03/18/2010   Method used:   Electronically to        [facility]* (retail)       [address]       [place], [place]  [address]       Ph: [phone]       Fax: [phone]   RxID:   [id number] LISINOPRIL 20 MG TABS (LISINOPRIL) Take one tablet by mouth daily  #30 x 6   Entered by:   Teressa Lower RN   Authorized by:   Kathlen Brunswick, MD, [facility]   Signed by:   Teressa Lower RN on 03/18/2010   Method used:   Electronically to        [facility]* (retail)       [address]       [place], [place]  [address]       Ph: [phone]       Fax: [phone]   RxID:   [id number] AMLODIPINE BESYLATE 5 MG TABS (AMLODIPINE BESYLATE) Take one tablet by mouth daily  #30 x 6   Entered by:   Teressa Lower RN   Authorized by:   Kathlen Brunswick, MD, [facility]   Signed by:   Teressa Lower RN on 03/18/2010   Method used:   Electronically to        [facility] (retail)       [address]       [place], [place]  [address]       Ph: [phone]       Fax: [phone]   RxID:   [id number]

## 2010-07-12 NOTE — Letter (Signed)
Summary: [place] [facility], [facility] at [employer]  [address] [place] [address]   Phone: [phone]  Fax: [phone]     March 18, 2010 MRN: [id number]   Curtis Lang [address] [place], [place]  [address]      YOUR LAB WORK IS DUE   MONDAY   March 21, 2010  Please go to [facility], located across the street from [facility] on the second floor.  Hours are Monday - Friday 7am until 7:30pm         Saturday 8am until 12noon    _X_  DO NOT EAT OR DRINK AFTER MIDNIGHT EVENING PRIOR TO LABWORK  __ YOUR LABWORK IS NOT FASTING --YOU MAY EAT PRIOR TO LABWORK

## 2010-07-12 NOTE — Assessment & Plan Note (Signed)
Summary: 2 mth nurse visit and bp check per checkout on 03/18/10/tg  Nurse Visit   Vital Signs:  Patient profile:   77 year old male Weight:      167 pounds O2 Sat:      98 % on Room air Pulse rate:   60 / minute BP sitting:   143 / 71  (left arm)  Vitals Entered By: Larita Fife Via LPN (May 19, 2010 4:16 PM)  O2 Flow:  Room air  Referring Provider:  Neurosurgery-Roy; Oncology-Neijstrom Primary Provider:  Dr. Dorthey Sawyer   History of Present Illness: S: Pt. arrives in office for 2 month BP check with nurse.  B: On last OV with Dr. Dietrich Pates on 10-7 pt. was advised to start taking Amlodipine 5mg  by mouth once daily, monitor and record BP's and bring BP diary to this nurse visit.  A: Pt. c/o pain in upper legs that radiates to lower legs only while standing, not while sitting or lying, otherwise he has no complaints. Pt's BP today is 143/71 (on 10-7 BP=143/69). He did not bring meds or BP diary to visit. He states he is taking meds as directed, [facility] confirmed that pt. has been getting refills on his meds. R: Advised pt. we would call him with Joni Reining, Np's recommendations.   Allergies: No Known Drug Allergies No changes at this time.  BP should be lower 130's systolic.  Would like him to bring BP log with him on next visit.  Joni Reining NP  Left detailed message on answering machine. Larita Fife Via LPN  May 20, 2010 10:45 AM

## 2010-07-15 ENCOUNTER — Encounter (HOSPITAL_COMMUNITY): Admission: RE | Admit: 2010-07-15 | Payer: Self-pay | Source: Home / Self Care | Admitting: Oncology

## 2010-07-15 ENCOUNTER — Ambulatory Visit (HOSPITAL_COMMUNITY): Admit: 2010-07-15 | Payer: Self-pay | Admitting: Oncology

## 2010-07-15 ENCOUNTER — Other Ambulatory Visit (HOSPITAL_COMMUNITY): Payer: MEDICARE

## 2010-07-15 ENCOUNTER — Ambulatory Visit (HOSPITAL_COMMUNITY): Payer: MEDICARE | Admitting: Oncology

## 2010-07-15 DIAGNOSIS — C8589 Other specified types of non-Hodgkin lymphoma, extranodal and solid organ sites: Secondary | ICD-10-CM

## 2010-07-20 ENCOUNTER — Telehealth (INDEPENDENT_AMBULATORY_CARE_PROVIDER_SITE_OTHER): Payer: Self-pay | Admitting: *Deleted

## 2010-07-28 NOTE — Progress Notes (Signed)
Summary: rx refill  Phone Note Call from Patient Call back at Home Phone [phone]   Caller: pt Reason for Call: Refill Medication Summary of Call: pt needs pot cl micro called in to [facility] Initial call taken by: Faythe Ghee,  July 20, 2010 11:41 AM    Prescriptions: KLOR-CON M10 10 MEQ CR-TABS (POTASSIUM CHLORIDE CRYS CR) Take 1 tablet by mouth once a day  #30 x 6   Entered by:   Teressa Lower RN   Authorized by:   Kathlen Brunswick, MD, [facility]   Signed by:   Teressa Lower RN on 07/20/2010   Method used:   Electronically to        [facility] (retail)       [address]       [place], [place]  [address]       Ph: [phone]       Fax: [phone]   RxID:   [id number]

## 2010-08-10 ENCOUNTER — Telehealth (INDEPENDENT_AMBULATORY_CARE_PROVIDER_SITE_OTHER): Payer: Self-pay

## 2010-08-18 NOTE — Progress Notes (Signed)
**Note De-Identified Curtis Lang Obfuscation** Summary: Refills  Phone Note Other Incoming   Caller: patient came into office Request: Send information Summary of Call: needs refills on Furosemide 40mg  once daily, Lisinopril 20mg  once daily, Simvastatin 40mg  once daily sent to [facility] Initial call taken by: Raechel Ache [facility],  August 10, 2010 11:25 AM    New/Updated Medications: LISINOPRIL 20 MG TABS (LISINOPRIL) Take one tablet by mouth daily FUROSEMIDE 40 MG TABS (FUROSEMIDE) Take 1 tablet by mouth once a day SIMVASTATIN 40 MG TABS (SIMVASTATIN) Take one tablet by mouth daily at bedtime Prescriptions: SIMVASTATIN 40 MG TABS (SIMVASTATIN) Take one tablet by mouth daily at bedtime  #30 x 8   Entered by:   Larita Fife Kaycie Pegues LPN   Authorized by:   Kathlen Brunswick, MD, [facility]   Signed by:   Larita Fife Derion Kreiter LPN on 04/54/0981   Method used:   Electronically to        [facility] (retail)       [address]       [place], [place]  [address]       Ph: [phone]       Fax: [phone]   RxID:   [id number] FUROSEMIDE 40 MG TABS (FUROSEMIDE) Take 1 tablet by mouth once a day  #30 x 8   Entered by:   Larita Fife Louvina Cleary LPN   Authorized by:   Kathlen Brunswick, MD, [facility]   Signed by:   Larita Fife Alaynna Kerwood LPN on 36/64/4034   Method used:   Electronically to        [facility]* (retail)       [address]       [place], [place]  [address]       Ph: [phone]       Fax: [phone]   RxID:   [id number] LISINOPRIL 20 MG TABS (LISINOPRIL) Take one tablet by mouth daily  #30 x 8   Entered by:   Larita Fife Terrin Meddaugh LPN   Authorized by:   Kathlen Brunswick, MD, [facility]   Signed by:   Larita Fife Alease Fait LPN on 32/20/2542   Method used:   Electronically to        [facility] (retail)       [address]       [place], [place]  [address]       Ph: [phone]       Fax: [phone]   RxID:   [id number]

## 2010-08-22 LAB — COMPREHENSIVE METABOLIC PANEL
Alkaline Phosphatase: 62 U/L (ref 39–117)
BUN: 15 mg/dL (ref 6–23)
Calcium: 8.9 mg/dL (ref 8.4–10.5)
Glucose, Bld: 97 mg/dL (ref 70–99)
Total Protein: 7.2 g/dL (ref 6.0–8.3)

## 2010-08-22 LAB — DIFFERENTIAL
Basophils Relative: 1 % (ref 0–1)
Monocytes Relative: 7 % (ref 3–12)
Neutro Abs: 4.7 10*3/uL (ref 1.7–7.7)
Neutrophils Relative %: 62 % (ref 43–77)

## 2010-08-22 LAB — LACTATE DEHYDROGENASE: LDH: 162 U/L (ref 94–250)

## 2010-08-22 LAB — CBC
HCT: 36.8 % — ABNORMAL LOW (ref 39.0–52.0)
MCH: 31.9 pg (ref 26.0–34.0)
MCHC: 34.8 g/dL (ref 30.0–36.0)
MCV: 91.8 fL (ref 78.0–100.0)
RDW: 13.3 % (ref 11.5–15.5)

## 2010-08-25 LAB — DIFFERENTIAL
Basophils Relative: 0 % (ref 0–1)
Eosinophils Absolute: 0.4 10*3/uL (ref 0.0–0.7)
Eosinophils Relative: 6 % — ABNORMAL HIGH (ref 0–5)
Monocytes Absolute: 0.4 10*3/uL (ref 0.1–1.0)
Monocytes Relative: 7 % (ref 3–12)

## 2010-08-25 LAB — COMPREHENSIVE METABOLIC PANEL
ALT: 10 U/L (ref 0–53)
AST: 13 U/L (ref 0–37)
Albumin: 3.1 g/dL — ABNORMAL LOW (ref 3.5–5.2)
Alkaline Phosphatase: 43 U/L (ref 39–117)
GFR calc Af Amer: >60 mL/min (ref >60)
Glucose, Bld: 90 mg/dL (ref 70–99)
Potassium: 2.5 mEq/L — CL (ref 3.5–5.1)
Sodium: 143 mEq/L (ref 135–145)
Total Protein: 5.4 g/dL — ABNORMAL LOW (ref 6.0–8.3)

## 2010-08-25 LAB — CBC
HCT: 36.7 % — ABNORMAL LOW (ref 39.0–52.0)
Platelets: 196 10*3/uL (ref 150–400)
RDW: 14.2 % (ref 11.5–15.5)
WBC: 6.5 10*3/uL (ref 4.0–10.5)

## 2010-08-26 ENCOUNTER — Encounter (HOSPITAL_COMMUNITY): Payer: Medicare Other | Attending: Oncology

## 2010-08-26 ENCOUNTER — Other Ambulatory Visit (HOSPITAL_COMMUNITY): Payer: Medicare Other

## 2010-08-26 DIAGNOSIS — C8589 Other specified types of non-Hodgkin lymphoma, extranodal and solid organ sites: Secondary | ICD-10-CM

## 2010-08-26 DIAGNOSIS — Z452 Encounter for adjustment and management of vascular access device: Secondary | ICD-10-CM

## 2010-08-31 LAB — DIFFERENTIAL
Basophils Absolute: 0 10*3/uL (ref 0.0–0.1)
Basophils Relative: 1 % (ref 0–1)
Lymphocytes Relative: 21 % (ref 12–46)
Monocytes Absolute: 0.4 10*3/uL (ref 0.1–1.0)
Monocytes Relative: 6 % (ref 3–12)
Neutro Abs: 4.6 10*3/uL (ref 1.7–7.7)
Neutrophils Relative %: 66 % (ref 43–77)

## 2010-08-31 LAB — LACTATE DEHYDROGENASE: LDH: 156 U/L (ref 94–250)

## 2010-08-31 LAB — CBC
HCT: 39.8 % (ref 39.0–52.0)
Hemoglobin: 13.5 g/dL (ref 13.0–17.0)
MCV: 95.8 fL (ref 78.0–100.0)
Platelets: 239 10*3/uL (ref 150–400)
RDW: 14.3 % (ref 11.5–15.5)

## 2010-08-31 LAB — COMPREHENSIVE METABOLIC PANEL
Albumin: 3.9 g/dL (ref 3.5–5.2)
BUN: 11 mg/dL (ref 6–23)
Creatinine, Ser: 0.99 mg/dL (ref 0.4–1.5)
Glucose, Bld: 111 mg/dL — ABNORMAL HIGH (ref 70–99)
Total Bilirubin: 0.7 mg/dL (ref 0.3–1.2)
Total Protein: 7.8 g/dL (ref 6.0–8.3)

## 2010-09-12 LAB — CBC
Hemoglobin: 13.1 g/dL (ref 13.0–17.0)
MCHC: 33 g/dL (ref 30.0–36.0)
MCHC: 34 g/dL (ref 30.0–36.0)
MCV: 96.1 fL (ref 78.0–100.0)
Platelets: 188 10*3/uL (ref 150–400)
RBC: 4.09 MIL/uL — ABNORMAL LOW (ref 4.22–5.81)
RDW: 14.2 % (ref 11.5–15.5)
WBC: 6.5 10*3/uL (ref 4.0–10.5)
WBC: 7.3 10*3/uL (ref 4.0–10.5)

## 2010-09-12 LAB — DIFFERENTIAL
Basophils Relative: 0 % (ref 0–1)
Eosinophils Relative: 5 % (ref 0–5)
Lymphocytes Relative: 22 % (ref 12–46)
Lymphocytes Relative: 26 % (ref 12–46)
Lymphs Abs: 1.4 10*3/uL (ref 0.7–4.0)
Lymphs Abs: 1.9 10*3/uL (ref 0.7–4.0)
Monocytes Absolute: 0.5 10*3/uL (ref 0.1–1.0)
Monocytes Absolute: 0.5 10*3/uL (ref 0.1–1.0)
Monocytes Relative: 6 % (ref 3–12)
Neutro Abs: 4.3 10*3/uL (ref 1.7–7.7)
Neutro Abs: 4.6 10*3/uL (ref 1.7–7.7)
Neutrophils Relative %: 62 % (ref 43–77)

## 2010-09-12 LAB — COMPREHENSIVE METABOLIC PANEL
AST: 16 U/L (ref 0–37)
Albumin: 3.8 g/dL (ref 3.5–5.2)
BUN: 11 mg/dL (ref 6–23)
Calcium: 8.6 mg/dL (ref 8.4–10.5)
Chloride: 104 mEq/L (ref 96–112)
Creatinine, Ser: 1 mg/dL (ref 0.4–1.5)
GFR calc Af Amer: >60 mL/min (ref >60)
Total Protein: 6.9 g/dL (ref 6.0–8.3)

## 2010-09-12 LAB — BASIC METABOLIC PANEL
CO2: 27 mEq/L (ref 19–32)
Calcium: 9 mg/dL (ref 8.4–10.5)
Creatinine, Ser: 1.07 mg/dL (ref 0.4–1.5)
GFR calc Af Amer: >60 mL/min (ref >60)
GFR calc non Af Amer: >60 mL/min (ref >60)
Sodium: 138 mEq/L (ref 135–145)

## 2010-09-12 LAB — POCT CARDIAC MARKERS: Myoglobin, poc: 91.5 ng/mL (ref 12–200)

## 2010-09-14 ENCOUNTER — Other Ambulatory Visit: Payer: Self-pay | Admitting: Cardiology

## 2010-09-14 LAB — GLUCOSE, CAPILLARY: Glucose-Capillary: 151 mg/dL — ABNORMAL HIGH (ref 70–99)

## 2010-09-14 LAB — BASIC METABOLIC PANEL WITH GFR
BUN: 14 mg/dL (ref 6–23)
CO2: 31 meq/L (ref 19–32)
Calcium: 9.1 mg/dL (ref 8.4–10.5)
Chloride: 103 meq/L (ref 96–112)
Creatinine, Ser: 1.14 mg/dL (ref 0.4–1.5)
GFR calc non Af Amer: >60 mL/min
Glucose, Bld: 148 mg/dL — ABNORMAL HIGH (ref 70–99)
Potassium: 3.7 meq/L (ref 3.5–5.1)
Sodium: 138 meq/L (ref 135–145)

## 2010-09-14 LAB — URINALYSIS, ROUTINE W REFLEX MICROSCOPIC
Bilirubin Urine: NEGATIVE
Ketones, ur: NEGATIVE mg/dL
Leukocytes, UA: NEGATIVE
Nitrite: NEGATIVE
Protein, ur: NEGATIVE mg/dL
Urobilinogen, UA: 0.2 mg/dL (ref 0.0–1.0)

## 2010-09-14 LAB — POCT CARDIAC MARKERS
CKMB, poc: 1.5 ng/mL (ref 1.0–8.0)
Myoglobin, poc: 111 ng/mL (ref 12–200)
Troponin i, poc: <0.05 ng/mL (ref 0.00–0.09)

## 2010-09-14 LAB — CARDIAC PANEL(CRET KIN+CKTOT+MB+TROPI)
CK, MB: 2.4 ng/mL (ref 0.3–4.0)
Total CK: 57 U/L (ref 7–232)

## 2010-09-14 LAB — LIPID PANEL
Cholesterol: 134 mg/dL (ref 0–200)
HDL: 32 mg/dL — ABNORMAL LOW (ref >39)
LDL Cholesterol: 55 mg/dL (ref 0–99)
Total CHOL/HDL Ratio: 4.2 RATIO
Triglycerides: 233 mg/dL — ABNORMAL HIGH (ref <150)

## 2010-09-14 LAB — DIFFERENTIAL
Basophils Absolute: 0 10*3/uL (ref 0.0–0.1)
Eosinophils Relative: 7 % — ABNORMAL HIGH (ref 0–5)
Lymphocytes Relative: 26 % (ref 12–46)
Monocytes Absolute: 0.4 10*3/uL (ref 0.1–1.0)
Monocytes Relative: 8 % (ref 3–12)
Neutro Abs: 3.4 10*3/uL (ref 1.7–7.7)

## 2010-09-14 LAB — CBC
HCT: 38.7 % — ABNORMAL LOW (ref 39.0–52.0)
Hemoglobin: 13.3 g/dL (ref 13.0–17.0)
MCHC: 34.4 g/dL (ref 30.0–36.0)
RBC: 4.05 MIL/uL — ABNORMAL LOW (ref 4.22–5.81)
RDW: 14.5 % (ref 11.5–15.5)

## 2010-09-14 LAB — BASIC METABOLIC PANEL
BUN: 18 mg/dL (ref 6–23)
BUN: 16 mg/dL (ref 6–23)
CO2: 26 mEq/L (ref 19–32)
CO2: 24 mEq/L (ref 19–32)
Calcium: 8.4 mg/dL (ref 8.4–10.5)
Chloride: 103 mEq/L (ref 96–112)
Glucose, Bld: 104 mg/dL — ABNORMAL HIGH (ref 70–99)
Potassium: 4.4 mEq/L (ref 3.5–5.3)
Sodium: 132 mEq/L — ABNORMAL LOW (ref 135–145)

## 2010-09-17 LAB — COMPREHENSIVE METABOLIC PANEL
BUN: 14 mg/dL (ref 6–23)
CO2: 29 mEq/L (ref 19–32)
Chloride: 99 mEq/L (ref 96–112)
Creatinine, Ser: 0.95 mg/dL (ref 0.4–1.5)
GFR calc non Af Amer: >60 mL/min (ref >60)
Total Bilirubin: 0.6 mg/dL (ref 0.3–1.2)

## 2010-09-17 LAB — CBC
HCT: 37.2 % — ABNORMAL LOW (ref 39.0–52.0)
MCHC: 34.8 g/dL (ref 30.0–36.0)
MCV: 96.5 fL (ref 78.0–100.0)
Platelets: 190 10*3/uL (ref 150–400)
RBC: 3.86 MIL/uL — ABNORMAL LOW (ref 4.22–5.81)
WBC: 6.8 10*3/uL (ref 4.0–10.5)

## 2010-09-17 LAB — DIFFERENTIAL
Basophils Absolute: 0 10*3/uL (ref 0.0–0.1)
Lymphocytes Relative: 28 % (ref 12–46)
Neutro Abs: 3.8 10*3/uL (ref 1.7–7.7)

## 2010-09-17 LAB — SEDIMENTATION RATE: Sed Rate: 19 mm/hr — ABNORMAL HIGH (ref 0–16)

## 2010-09-17 LAB — LACTATE DEHYDROGENASE: LDH: 206 U/L (ref 94–250)

## 2010-09-19 ENCOUNTER — Telehealth: Payer: Self-pay | Admitting: *Deleted

## 2010-09-19 NOTE — Telephone Encounter (Signed)
Message copied by Teressa Lower on Mon Sep 19, 2010  8:48 AM ------      Message from: Teressa Lower      Created: Mon Sep 19, 2010  8:40 AM                   ----- Message -----         From: Gerrit Friends. Dietrich Pates, MD         Sent: 09/17/2010   8:39 PM           To: Audry Pili, RN            Lab results reviewed.      No change in Rx.

## 2010-09-19 NOTE — Telephone Encounter (Signed)
Message copied by Teressa Lower on Mon Sep 19, 2010  8:42 AM ------      Message from: Belen Bing      Created: Sat Sep 17, 2010  8:39 PM       Lab results reviewed.      No change in Rx.

## 2010-09-20 LAB — COMPREHENSIVE METABOLIC PANEL
ALT: 15 U/L (ref 0–53)
AST: 18 U/L (ref 0–37)
CO2: 29 mEq/L (ref 19–32)
Calcium: 8.7 mg/dL (ref 8.4–10.5)
Chloride: 103 mEq/L (ref 96–112)
GFR calc Af Amer: >60 mL/min (ref >60)
GFR calc non Af Amer: >60 mL/min (ref >60)
Potassium: 3.4 mEq/L — ABNORMAL LOW (ref 3.5–5.1)
Sodium: 137 mEq/L (ref 135–145)
Total Bilirubin: 0.6 mg/dL (ref 0.3–1.2)

## 2010-09-20 LAB — CBC
RBC: 3.95 MIL/uL — ABNORMAL LOW (ref 4.22–5.81)
WBC: 5.8 10*3/uL (ref 4.0–10.5)

## 2010-09-20 LAB — DIFFERENTIAL
Eosinophils Absolute: 0.4 10*3/uL (ref 0.0–0.7)
Lymphs Abs: 1.6 10*3/uL (ref 0.7–4.0)
Monocytes Absolute: 0.4 10*3/uL (ref 0.1–1.0)
Monocytes Relative: 7 % (ref 3–12)
Neutrophils Relative %: 56 % (ref 43–77)

## 2010-09-20 LAB — LACTATE DEHYDROGENASE: LDH: 174 U/L (ref 94–250)

## 2010-10-06 ENCOUNTER — Encounter (HOSPITAL_COMMUNITY): Payer: Medicare Other | Attending: Oncology

## 2010-10-06 DIAGNOSIS — C8589 Other specified types of non-Hodgkin lymphoma, extranodal and solid organ sites: Secondary | ICD-10-CM

## 2010-10-06 DIAGNOSIS — Z452 Encounter for adjustment and management of vascular access device: Secondary | ICD-10-CM

## 2010-10-25 NOTE — Letter (Signed)
October 08, 2008    Patrica Duel, M.D.  [address]  [place], [place] [address]   RE:  Curtis Lang, Curtis Lang  MRN:  [id number]  /  DOB:  11/20/1930   Dear Loraine Leriche:   Mr. Stetzer returns to the office for continued assessment and treatment  of coronary artery disease, cardiovascular risk factors and mild  cerebrovascular disease.  Since his last visit, he has done very well.  He is receiving no further treatment for lymphoma, which he has been  told is in remission.  Blood pressure control has been good.  He has  gradually decreased some of this more strenuous activities, but  continues to dance.  He notes pain in his thighs and calves with that  effort that resolved with rest.  He has mild chronic class II dyspnea on  exertion.  He has had no chest discomfort.   A carotid ultrasound study was performed last year, this demonstrated  plaque without significant focal stenosis.   A lipid profile last year was excellent.   CURRENT MEDICATIONS:  Include  1. Lisinopril 20 mg daily.  2. Simvastatin 40 mg daily.  3. Aspirin 81 mg daily.  4. Furosemide 40 mg daily.   Mr. Palmeri inquired about his diet.  He wonders if an egg on a biscuit  smothered in gravy is the right choice for him.   PHYSICAL EXAMINATION:  GENERAL:  A very pleasant gentleman, in no acute  distress.  VITAL SIGNS:  The weight is 173, unchanged.  Blood pressure 130/70,  heart rate 65 and regular, respirations 12 and unlabored.  NECK:  Right  carotid bruit.  Normal carotid upstrokes.  No jugular venous distention.  LUNGS:  Clear.  CARDIAC:  Normal first and second heart sounds.  Modest systolic murmur.  ABDOMEN:  Soft and nontender; no bruits; aortic pulsation not palpable;  liver edge at the right costal margin.  EXTREMITIES:  Dorsalis pedis pulses are 1+; posterior tibial pulses are  not palpable, but are obtainable with a Doppler device.   IMPRESSION:  Mr. Pentecost is doing well overall.  The myalgias in his  legs  could reflect ineffective simvastatin, but they only occur with  exercise.  They do not sound exactly like claudication.  We will  investigate further by checking ABIs.  Simvastatin will be held for the  time being.  I will reassess this nice gentleman in 1 month, at which  time I think I will conclude that his discomfort is muscular in origin  and related to unaccustomed activity.  A chemistry profile is pending.    Sincerely,      Gerrit Friends. Dietrich Pates, MD, [facility]  Electronically Signed    RMR/MedQ  DD: 10/08/2008  DT: 10/09/2008  Job #: [id number]

## 2010-10-25 NOTE — Op Note (Signed)
NAMEREDMOND, WHITTLEY NO.:  [id number]   MEDICAL RECORD NO.:  [id number]          PATIENT TYPE:  AMB   LOCATION:  DAY                           FACILITY:  [facility]   PHYSICIAN:  Barbaraann Barthel, M.D. DATE OF BIRTH:  1930-11-27   DATE OF PROCEDURE:  02/05/2008  DATE OF DISCHARGE:                               OPERATIVE REPORT   SURGEON:  Barbaraann Barthel, MD.   PREOPERATIVE DIAGNOSIS:  B-cell lymphoma, abnormal right axillary lymph  node by PET scan.   POSTOPERATIVE DIAGNOSIS:  B-cell lymphoma, abnormal right axillary lymph  node by PET scan.   PROCEDURE:  Deep biopsy of right axillary lymph node with needle  localization.   NOTE:  This is a 77 year old white male, who had a history of B-cell  lymphoma, who had an abnormal PET scan.  Surgery was consulted for  biopsy of the right axillary lymph node that lit up in the scan.  Clinically, I could not palpate this node at all.  I discussed  preoperatively the need for needle localization with Dr. Pia Mau, and he  performed this excellently, and preoperatively, we had the needle in  good position.  We then took him to the operating room for the  procedure.   GROSS OPERATIVE FINDINGS:  The patient had a fleshy, sort of friable-  appearing lymph node that was almost 2 cm at least in length by  approximately 1 cm or so in width.  The needle was sent with a specimen.   The specimen was sent in a saline-soaked gauze without formalin as  discussed with Dr. Laureen Ochs.   TECHNIQUE:  The patient was placed in the decubitus position with his  right arm outstretched.  He was prepped with Betadine solution and  draped in the usual manner, carefully not dislodging the guidewire.  We  made an incision around the guidewire, then carefully dissected down  several centimeters to where the end of the wire was, and it abutted  against a fleshy enlarged friable lymph node.  This was removed,  ligating and clipping the various afferent  and efferent vessels to the  lymph node.  This was then sent in saline as instructed.  We checked for  hemostasis.  We  irrigated with normal saline.  Oozing was minimal.  We then closed the  subcutaneous space using 3-0 Polysorb and the skin with 4-0 nylon.  Prior to closure, all sponge, needle, and instrument counts were found  to be correct.  Estimated blood loss was minimal.  No drain was placed.  There were no complications.      Barbaraann Barthel, M.D.  Electronically Signed     WB/MEDQ  D:  02/05/2008  T:  02/06/2008  Job:  [id number]   cc:   Ladona Horns. Mariel Sleet, MD  Fax: [phone]   Guerry Bruin, MD   Janece Canterbury  Fax: [phone]

## 2010-10-25 NOTE — Letter (Signed)
Nov 05, 2008    Patrica Duel, MD  [address]  [place], [place] [address]   RE:  Curtis Lang, Curtis Lang  MRN:  [id number]  /  DOB:  22-Sep-1930   Dear Loraine Leriche:   Mr. Selover returned to the office for continued assessment and treatment  of vascular disease, hyperlipidemia, coronary artery disease, and  hypertension.  Since his last visit, he has been stable.  Despite  discontinuing simvastatin, he continues to experience intermittent  discomfort along the medial aspect of his thighs and along the calves  bilaterally.  There is some muscle tenderness when this occurs.  It is  not particularly troublesome to him, it is intermittent, and is  relatively infrequent.  Otherwise, he reports no chest discomfort and no  dyspnea.  He is unaware as to whether blood pressure control is good or  not.   PHYSICAL EXAMINATION:  GENERAL:  Trim pleasant gentleman in no acute  distress.  VITAL SIGNS:  The weight is 176, 3 pounds more than in April.  Blood  pressure is 135/75, heart rate is 60 and regular, respirations 14 and  unlabored.  NECK:  No jugular venous distention; normal carotid upstrokes without  bruits.  LUNGS:  Clear.  CARDIAC:  Normal first and second heart sounds; basilar systolic  ejection murmur.  ABDOMEN:  Soft and nontender; no organomegaly.  EXTREMITIES:  Distal pulses remain difficult to palpate; trace edema.   An arterial duplex study of the lower extremities was performed.  ABIs  were mildly depressed on the left with evidence for mild obstructive  disease; and findings were normal on the right.   IMPRESSION:  Mr. Umscheid is doing well overall.  His leg discomfort does  not appear to be attributable either to his medication or to mild  peripheral vascular disease.  He will resume simvastatin.   A control of hypertension is good on lisinopril alone.   Control of pedal edema is good with furosemide 40 mg daily.  A recent  chemistry profile shows a potassium of 3.4.  We  will provide him with  information regarding foods high in potassium and recheck this in 2  months.  If it remains low, he will require a potassium supplement.  I  will see this nice gentleman again in 1 year.    Sincerely,      Gerrit Friends. Dietrich Pates, MD, [facility]  Electronically Signed    RMR/MedQ  DD: 11/05/2008  DT: 11/06/2008  Job #: [id number]

## 2010-10-25 NOTE — Letter (Signed)
October 09, 2007    Patrica Duel, M.D.  [address]  [place], [place]  [address]   RE:  Curtis, Lang  MRN:  [id number]  /  DOB:  01/08/1931   Dear Loraine Leriche:   Curtis Lang returns to the office for continued assessment and treatment  of coronary disease.  Since undergoing angioplasty in 1996 he has  remained asymptomatic with respect to this condition.  He continues to  be followed for lymphoma which first occurred in 2006 and recurred in  2007.  A recent PET scan suggested active although fairly limited  disease.  His edema which was prominent last year has improved.  He  remains active without any cardiopulmonary symptoms except for easy  fatigability.  Nonetheless he has been cutting wood on his property.   CURRENT MEDICATIONS:  1. Lisinopril 20 mg daily.  2. Simvastatin 40 mg daily.  3. Aspirin 81 mg daily.  4. Furosemide 40 mg daily.   PHYSICAL EXAMINATION:  GENERAL:  A calm and pleasant trim gentleman in  no acute distress.  VITAL SIGNS:  Weight is 173, 3 pounds less than last year, blood  pressure 105/60, heart rate 70 and regular, respirations 14.  NECK:  No jugular venous distention; bilateral bruits more prominent on  the right.  LUNGS:  Clear.  CARDIAC:  Distant first and second heart sounds; minimal systolic  murmur.  ABDOMEN:  Soft and nontender; old surgical scar in the midline; no  bruits appreciated; no organomegaly; aortic pulsation not palpated.  EXTREMITIES:  Trace edema.   LABORATORY DATA:  Recent laboratories include a normal chemistry  profile.  His CBC shows mild chronic anemia with hemoglobin 12.2,  hematocrit 34, and MCV 93.  I see no lipid profile since 2006.   IMPRESSION:  Curtis Lang is doing very well from a cardiac standpoint and  fairly well overall.  We will obtain a lipid profile.  Carotid  ultrasound has not been performed for six years.  This study will be  repeated as well.  I will plan to see this nice gentleman again in one  year.    Sincerely,      Gerrit Friends. Dietrich Pates, MD, [facility]  Electronically Signed    RMR/MedQ  DD: 10/09/2007  DT: 10/09/2007  Job #: [id number]   CC:    Ladona Horns. Mariel Sleet, MD

## 2010-10-28 NOTE — Discharge Summary (Signed)
Curtis Lang, Curtis Lang NO.:  [id number]   MEDICAL RECORD NO.:  [id number]          PATIENT TYPE:  INP   LOCATION:  A223                          FACILITY:  [facility]   PHYSICIAN:  Madelin Rear. Sherwood Gambler, MD  DATE OF BIRTH:  1930/10/27   DATE OF ADMISSION:  05/01/2005  DATE OF DISCHARGE:  11/27/2006LH                                 DISCHARGE SUMMARY   DISCHARGE DIAGNOSES:  1.  Leukopenic fever.  2.  Pneumonia.  3.  Status post chemotherapy for lymphoma.   DISCHARGE MEDICATIONS:  1.  Niferex 325 mg p.o. daily.  2.  Nystatin oral suspension.  3.  Diflucan.  4.  Cardizem CD 180 mg daily.  5.  Toprol XL 25 mg p.o. daily.  6.  Zithromax 500 mg p.o. daily.  7.  Avelox 400 mg p.o. daily.   SUMMARY:  The patient was admitted with mental status changes and apparent  sepsis with fever and an almost undetectable white blood cell count as well  as severe thrombocytopenia and anemia.  He was noted to be about a week and  a half status post chemotherapy for non-Hodgkin's lymphoma per oncology.  He  was covered with broad spectrum antibiotics including Primaxin.  The only  complication we had in-house was some oral thrush which was treated with  antifungal agent.  He responded beautifully, defervesced, white count came  up to adequate levels.  His platelet count also rebounded after need of  subtle transfusions.   CONDITION ON DISCHARGE:  On the day of discharge, he was asymptomatic.   FOLLOWUP:  Follow up in office in one week.      Madelin Rear. Sherwood Gambler, MD  Electronically Signed     LJF/MEDQ  D:  05/08/2005  T:  05/08/2005  Job:  [id number]

## 2010-10-28 NOTE — Procedures (Signed)
[name], Curtis Lang NO.:  [id number]   MEDICAL RECORD NO.:  [id number]          PATIENT TYPE:  OUT   LOCATION:  RAD                           FACILITY:  [facility]   PHYSICIAN:  Gerrit Friends. Dietrich Pates, MD, FACCDATE OF BIRTH:  1930/08/16   DATE OF PROCEDURE:  DATE OF DISCHARGE:  08/29/2006                                ECHOCARDIOGRAM   REFERRING PHYSICIANS:  1. Patrica Duel, M.D.  2. Gerrit Friends. Dietrich Pates, MD, [facility].   CLINICAL DATA:  A 77 year old gentleman with cardiomyopathy, coronary  disease and hypertension.  Aorta 3.2, left atrium 3.6, septum 1.4,  posterior wall 1.1, LV diastole 4.8, LV systole 3.8.  1. Technically adequate echocardiographic study.  2. Mild left atrial enlargement; normal right atrium and right      ventricle.  3. Mild to moderate sclerosis of a trileaflet aortic valve; trivial      aortic insufficiency.  4. Normal diameter of the proximal ascending aorta; mild calcification      of the wall.  5. Normal pulmonic valve and proximal pulmonary artery; normal      tricuspid valve.  6. Normal mitral valve; mild to moderate annular calcification.  7. Left ventricular size at the upper limit of normal; mild concentric      hypertrophy; there is akinesis of the inferior/posterior segment      with mildly to moderately impaired overall LV systolic function.      Estimated ejection fraction is 0.40-0.45.  8. Normal IVC.  9. Comparison with prior study of July 01, 2001:  Left ventricular      systolic function is now impaired; there is a new segmental wall      motion abnormality as described.      Gerrit Friends. Dietrich Pates, MD, [facility]  Electronically Signed     RMR/MEDQ  D:  08/30/2006  T:  08/30/2006  Job:  [id number]

## 2010-10-28 NOTE — H&P (Signed)
Curtis Lang, CARNEAL NO.:  [id number]   MEDICAL RECORD NO.:  [id number]          PATIENT TYPE:  INP   LOCATION:  ED99                          FACILITY:  [facility]   PHYSICIAN:  Madelin Rear. Sherwood Gambler, MD  DATE OF BIRTH:  January 11, 1931   DATE OF ADMISSION:  05/01/2005  DATE OF DISCHARGE:  LH                                HISTORY & PHYSICAL   CHIEF COMPLAINT:  Change in mental status.   HISTORY OF PRESENT ILLNESS:  The patient is currently 1 week post  chemotherapy for non-Hodgkin's lymphoma.  He developed GI symptomatology  approximately 1 week prior to this admission with nausea, vomiting and  diarrhea.  Apparently he was also seen in outpatient clinic for this.  He  progressively became more debilitated and weak prompting transfer to the  emergency department by concerned family. He was evaluated there and found  to be leukopenic, febrile, with mental status changes.   PAST MEDICAL HISTORY:  1.  He has had neutropenic fever before, status post chemo with severe      pancytopenia.  2.  Non-Hodgkin's lymphoma managed by Dr. Mariel Sleet of oncology.  3.  Well-controlled hypertension and hyperlipidemia in the past.   SOCIAL HISTORY:  Nonsmoker, nondrinker.  Supportive family in attendance.   FAMILY HISTORY:  Noncontributory for this illness.   REVIEW OF SYSTEMS:  Under HPI. No hematemesis or hematochezia was noted.  No  syncope. No chest pain or shortness of breath.  He may have had a slight  cough, but it has been nonproductive.   PHYSICAL EXAMINATION:  GENERAL:  He is pale appearing, lethargic, will open  eyes, and make eye contact, but no meaningful conversation. He does moan  no when asked if he is having any pain.  HEAD AND NECK:  Showed no JVD or adenopathy.  His neck is supple.  CHEST:  Occasional rhonchi noted, no wheezing.  CARDIAC EXAM:  Regular rhythm with a 2/6 systolic ejection murmur.  No  gallop or rub.  ABDOMEN:  Soft, no organomegaly or masses.  EXTREMITIES:  No clubbing, cyanosis, or edema.  NEUROLOGIC EXAMINATION:  As above.  He is very lethargic, and uncooperative,  but appears to move all 4 extremities well.  Cranial nerve assessment shows  that he is able to develop lateral gaze without extra ocular movement palsy.   LABS:  Show him to be pancytopenic with less than 8 gm%  hemoglobin, marked  thrombocytopenia, as well as less than 0.4 white blood cell count.  A pair  of blood cultures were obtained and pending at the present time.  Electrolytes reveal him to be severely hypokalemic as well.   IMPRESSION:  1.  Neutropenic fever with septicemia, unknown organism at present.  The      patient will be admitted for broad-spectrum antibiotic therapy pending      blood cultures.  He was noted, on chest x-ray, to have a questionable      developing pneumonia per radiology and will cover atypical organisms      with Zithromax and broad spectrum coverage with Primaxin.  2.  Severe hypokalemia.  Supplemental IV potassium runs until normalized.  3.  Hypertension, not an issue at present. Expect an observation.  4.  Pneumonia, supplemental oxygen, respiratory support in ICU, close      monitoring, reverse isolation.  5.  Pancytopenia, at present, will transfuse him 2 units of packed rbc's.      We will consider using Neulasta as well with oncology consultation.  His      platelet count is adequate, to not worry about bleeding at present.  6.  Heart murmur.  This appears to be new, may be flow related, however, we      will get an echocardiogram to rule out vegetations.  7.  Finally, it should be noted that the patient had several blasts of high-      dose steroids according to family members and will cover any possibility      of Addisonian crisis which is doubtful at present in light of the low      potassium with IV steroid bolus.      Madelin Rear. Sherwood Gambler, MD  Electronically Signed     LJF/MEDQ  D:  05/02/2005  T:  05/02/2005   Job:  [id number]

## 2010-11-18 ENCOUNTER — Other Ambulatory Visit (HOSPITAL_COMMUNITY): Payer: MEDICARE

## 2010-12-23 ENCOUNTER — Other Ambulatory Visit: Payer: Self-pay | Admitting: *Deleted

## 2010-12-23 MED ORDER — AMLODIPINE BESYLATE 5 MG PO TABS: 5.0000 mg | ORAL_TABLET | Freq: Every day | ORAL | Status: DC

## 2010-12-30 ENCOUNTER — Encounter (HOSPITAL_COMMUNITY): Payer: Medicare Other | Attending: Oncology

## 2010-12-30 DIAGNOSIS — C8589 Other specified types of non-Hodgkin lymphoma, extranodal and solid organ sites: Secondary | ICD-10-CM

## 2010-12-30 DIAGNOSIS — Z79899 Other long term (current) drug therapy: Secondary | ICD-10-CM | POA: Insufficient documentation

## 2010-12-30 DIAGNOSIS — Z9221 Personal history of antineoplastic chemotherapy: Secondary | ICD-10-CM | POA: Insufficient documentation

## 2010-12-30 DIAGNOSIS — C8584 Other specified types of non-Hodgkin lymphoma, lymph nodes of axilla and upper limb: Secondary | ICD-10-CM | POA: Insufficient documentation

## 2010-12-30 LAB — DIFFERENTIAL
Lymphocytes Relative: 20 % (ref 12–46)
Monocytes Absolute: 0.6 10*3/uL (ref 0.1–1.0)
Monocytes Relative: 8 % (ref 3–12)
Neutro Abs: 5.1 10*3/uL (ref 1.7–7.7)

## 2010-12-30 LAB — SEDIMENTATION RATE: Sed Rate: 35 mm/hr — ABNORMAL HIGH (ref 0–16)

## 2010-12-30 LAB — COMPREHENSIVE METABOLIC PANEL
BUN: 18 mg/dL (ref 6–23)
CO2: 27 mEq/L (ref 19–32)
Chloride: 102 mEq/L (ref 96–112)
Creatinine, Ser: 0.95 mg/dL (ref 0.50–1.35)
GFR calc Af Amer: >60 mL/min (ref >60)
GFR calc non Af Amer: >60 mL/min (ref >60)
Total Bilirubin: 0.2 mg/dL — ABNORMAL LOW (ref 0.3–1.2)

## 2010-12-30 LAB — CBC
HCT: 36.7 % — ABNORMAL LOW (ref 39.0–52.0)
Hemoglobin: 12.5 g/dL — ABNORMAL LOW (ref 13.0–17.0)
MCHC: 34.1 g/dL (ref 30.0–36.0)
MCV: 94.1 fL (ref 78.0–100.0)
WBC: 7.7 10*3/uL (ref 4.0–10.5)

## 2010-12-30 LAB — LACTATE DEHYDROGENASE: LDH: 242 U/L (ref 94–250)

## 2010-12-30 MED ORDER — HEPARIN SOD (PORK) LOCK FLUSH 100 UNIT/ML IV SOLN
500.0000 [IU] | Freq: Once | INTRAVENOUS | Status: AC
  Administered 2010-12-30: 500 [IU] via INTRAVENOUS

## 2010-12-30 MED ORDER — HEPARIN SOD (PORK) LOCK FLUSH 100 UNIT/ML IV SOLN
INTRAVENOUS | Status: AC
  Filled 2010-12-30: qty 5

## 2010-12-30 NOTE — Progress Notes (Signed)
2Donald W Lang presented for Portacath access and flush. Proper placement of portacath confirmed by CXR. Portacath located right chest wall accessed with  PH 20 needle. Good blood return present. Portacath flushed with 20ml NS and 500U/3ml Heparin and needle removed intact. Procedure without incident. Patient tolerated procedure well.

## 2011-01-05 ENCOUNTER — Encounter: Payer: Self-pay | Admitting: Cardiology

## 2011-01-08 ENCOUNTER — Emergency Department (HOSPITAL_COMMUNITY)
Admission: EM | Admit: 2011-01-08 | Discharge: 2011-01-08 | Disposition: A | Discharge status: Home / Self Care | Payer: Medicare Other | Attending: Emergency Medicine | Admitting: Emergency Medicine

## 2011-01-08 ENCOUNTER — Encounter (HOSPITAL_COMMUNITY): Payer: Self-pay

## 2011-01-08 ENCOUNTER — Emergency Department (HOSPITAL_COMMUNITY): Payer: Medicare Other

## 2011-01-08 ENCOUNTER — Other Ambulatory Visit: Payer: Self-pay

## 2011-01-08 DIAGNOSIS — Z8249 Family history of ischemic heart disease and other diseases of the circulatory system: Secondary | ICD-10-CM | POA: Insufficient documentation

## 2011-01-08 DIAGNOSIS — M25519 Pain in unspecified shoulder: Secondary | ICD-10-CM | POA: Insufficient documentation

## 2011-01-08 DIAGNOSIS — Z87891 Personal history of nicotine dependence: Secondary | ICD-10-CM | POA: Insufficient documentation

## 2011-01-08 DIAGNOSIS — I2510 Atherosclerotic heart disease of native coronary artery without angina pectoris: Secondary | ICD-10-CM | POA: Insufficient documentation

## 2011-01-08 DIAGNOSIS — M542 Cervicalgia: Secondary | ICD-10-CM | POA: Insufficient documentation

## 2011-01-08 DIAGNOSIS — R079 Chest pain, unspecified: Secondary | ICD-10-CM | POA: Insufficient documentation

## 2011-01-08 DIAGNOSIS — Z9861 Coronary angioplasty status: Secondary | ICD-10-CM | POA: Insufficient documentation

## 2011-01-08 DIAGNOSIS — R110 Nausea: Secondary | ICD-10-CM | POA: Insufficient documentation

## 2011-01-08 LAB — DIFFERENTIAL
Basophils Relative: 0 % (ref 0–1)
Monocytes Absolute: 0.6 10*3/uL (ref 0.1–1.0)
Monocytes Relative: 8 % (ref 3–12)
Neutro Abs: 4.8 10*3/uL (ref 1.7–7.7)

## 2011-01-08 LAB — CBC
HCT: 34.9 % — ABNORMAL LOW (ref 39.0–52.0)
Hemoglobin: 12 g/dL — ABNORMAL LOW (ref 13.0–17.0)
MCH: 32.4 pg (ref 26.0–34.0)
MCHC: 34.4 g/dL (ref 30.0–36.0)

## 2011-01-08 LAB — BASIC METABOLIC PANEL
BUN: 16 mg/dL (ref 6–23)
Chloride: 104 mEq/L (ref 96–112)
Creatinine, Ser: 0.88 mg/dL (ref 0.50–1.35)
GFR calc Af Amer: >60 mL/min (ref >60)

## 2011-01-08 MED ORDER — HEPARIN SOD (PORK) LOCK FLUSH 100 UNIT/ML IV SOLN
INTRAVENOUS | Status: AC
  Administered 2011-01-08: 500 [IU]
  Filled 2011-01-08: qty 5

## 2011-01-08 MED ORDER — ASPIRIN 81 MG PO CHEW
324.0000 mg | CHEWABLE_TABLET | Freq: Once | ORAL | Status: AC
  Administered 2011-01-08: 324 mg via ORAL
  Filled 2011-01-08: qty 4

## 2011-01-08 MED ORDER — SODIUM CHLORIDE 0.9 % IV SOLN
INTRAVENOUS | Status: DC
  Administered 2011-01-08: 17:00:00 via INTRAVENOUS

## 2011-01-08 NOTE — ED Notes (Signed)
Pt presents with mild chest pain and shoulder/neck pain. Pt states pain started approx 2 hrs ago. Pt denies SOB, N/V/D, and all other symptoms at this time.

## 2011-01-08 NOTE — ED Notes (Signed)
PT GIVEN SANDWICH AND PEANUT BUTTER CRACKERS PER REQUEST. DENIES PAIN. DENIES ANY NEEDS AT THIS TIME. CALL BELL AND FAMILY AT BS. NAD. WILL CONT TO MONITOR.

## 2011-01-08 NOTE — ED Notes (Signed)
LAB AT BS FOR REPEAT BLOOD DRAW.

## 2011-01-08 NOTE — ED Provider Notes (Signed)
Scribed for Dr. Effie Shy, the patient was seen in room 18. This chart was scribed by Hillery Hunter. This patient's care was started at 19:35.  History    Chief Complaint  Patient presents with  . Chest Pain  . Shoulder Pain  . Neck Pain   Patient is a 77 y.o. male presenting with chest pain. The history is provided by the patient.  Chest Pain The chest pain began 3 - 5 hours ago. The chest pain is improving. At its most intense, the pain is at 4/10. The pain is currently at 0/10. The quality of the pain is described as tightness. The pain radiates to the left shoulder, right shoulder and left neck. Primary symptoms include nausea. Pertinent negatives for primary symptoms include no fever, no fatigue, no shortness of breath, no cough, no wheezing, no abdominal pain, no vomiting and no altered mental status.  Pertinent negatives for associated symptoms include no diaphoresis, no lower extremity edema and no numbness. He tried nothing for the symptoms. Risk factors include male gender.    Patient reports tight feeling around both shoulders and neck that started today. He describes these symptoms starting about 15:00 today (4.5 hours ago) at 4/10 intensity at worst, and 0/10 currently, with associated nausea. His spouse is assisting history reporting and denies that the patient has had any shortness of breath or sweats. He has an appointment to see his cardiologist Dr. Cresenciano Lick in three days. He denies any previous admission for heart disease or evaluations (but the summary of my research of his medical history indicates otherwise below). He quit smoking.  Cardiology office visit 03/18/10 - Dr. Daleen Squibb record indicates patient has a history of CAD, RCA stent and angioplasty (performed by Dr. Clifton James). A stress test was done 05/13/09 showing inferior wall ischemia - Dr. Dietrich Pates.  Past Medical History  Diagnosis Date  . ASCVD (arteriosclerotic cardiovascular disease)     RCA stent and PTCA  od occ 199; cutting ballon for RCA restenosis in 2001; cath 12/10 - 100% RCA L->R collaterals; nml EF; 40% LAD and OM1  . HTN (hypertension)   . HLD (hyperlipidemia)   . CVD (cerebrovascular disease)     bilateral bruits; duplex in 2009 - mild plaque w/o stenosis  . PVD (peripheral vascular disease)     ABI on 0.87 on left  . Lymphoma     chemo and radiation therapy in 2006, recurrence in 2007  . Squamous cell carcinoma     s/p Mohs surgery  . History of tobacco abuse   . COPD (chronic obstructive pulmonary disease)   . Fasting hyperglycemia   . DDD (degenerative disc disease), cervical     Past Surgical History  Procedure Date  . Posterior fusion cervical spine 2003    posterior?  . Rt inguinal herniorrhaphy 1996  . Orif of long bone injury of left leg 1996  . Mohs surgery     Family History  Problem Relation Age of Onset  . Stroke Father     deceased - 66  . Stroke Mother     deceased - 51   . Heart attack Brother 56  . Heart attack Sister     History  Substance Use Topics  . Smoking status: Former Games developer  . Smokeless tobacco: Not on file  . Alcohol Use: No      Review of Systems  Constitutional: Negative for fever, diaphoresis and fatigue.  HENT: Positive for neck pain.   Respiratory: Negative for cough, shortness of  breath and wheezing.   Cardiovascular: Positive for chest pain.  Gastrointestinal: Positive for nausea. Negative for vomiting and abdominal pain.  Neurological: Negative for numbness.  Psychiatric/Behavioral: Negative for altered mental status.    Physical Exam  BP 160/63  Pulse 58  Temp(Src) 97.5 F (36.4 C) (Oral)  Resp 20  Ht 5\' 6"  (1.676 m)  Wt 170 lb (77.111 kg)  BMI 27.44 kg/m2  SpO2 96%  Physical Exam  Constitutional: He is oriented to person, place, and time. He appears well-developed and well-nourished. No distress.  HENT:  Head: Normocephalic and atraumatic.  Neck: Neck supple. No JVD present.  Cardiovascular: Normal  rate, regular rhythm and normal heart sounds.   No murmur heard. Pulmonary/Chest: Effort normal and breath sounds normal. No respiratory distress. He has no wheezes. He has no rales. He exhibits no tenderness.  Abdominal: Soft. Bowel sounds are normal. There is no tenderness. There is no rebound and no guarding.  Musculoskeletal: Normal range of motion. He exhibits no edema and no tenderness.       Lumbar back: He exhibits tenderness (mild lumbar).  Neurological: He is alert and oriented to person, place, and time.  Skin: Skin is warm and dry. He is not diaphoretic.  Psychiatric: He has a normal mood and affect. His behavior is normal.    ED Course  Procedures  OTHER DATA REVIEWED: Nursing notes, vital signs, and past medical records reviewed. (See HPI for medical record summary)   DIAGNOSTIC STUDIES: Oxygen Saturation is 97% on RA, normal by my interpretation.     Date: 01/08/2011  Rate: 61  Rhythm: normal sinus rhythm  QRS Axis: left , Q waves anteriorly  Intervals: PR prolonged  ST/T Wave abnormalities: normal  Conduction Disturbances:first-degree A-V block   Narrative Interpretation: No acute changes from previous, no STEMI  Old EKG Reviewed: unchanged and 05/09/09 reviewed    LABS / RADIOLOGY: Results for orders placed during the hospital encounter of 01/08/11  CARDIAC PANEL(CRET KIN+CKTOT+MB+TROPI)      Component Value Range   Total CK 64  7 - 232 (U/L)   CK, MB 3.7  0.3 - 4.0 (ng/mL)   Troponin I <0.30  <0.30 (ng/mL)   Relative Index RELATIVE INDEX IS INVALID  0.0 - 2.5   CBC      Component Value Range   WBC 7.2  4.0 - 10.5 (K/uL)   RBC 3.70 (*) 4.22 - 5.81 (MIL/uL)   Hemoglobin 12.0 (*) 13.0 - 17.0 (g/dL)   HCT 13.0 (*) 86.5 - 52.0 (%)   MCV 94.3  78.0 - 100.0 (fL)   MCH 32.4  26.0 - 34.0 (pg)   MCHC 34.4  30.0 - 36.0 (g/dL)   RDW 78.4  69.6 - 29.5 (%)   Platelets 241  150 - 400 (K/uL)  DIFFERENTIAL      Component Value Range   Neutrophils Relative 67   43 - 77 (%)   Neutro Abs 4.8  1.7 - 7.7 (K/uL)   Lymphocytes Relative 20  12 - 46 (%)   Lymphs Abs 1.4  0.7 - 4.0 (K/uL)   Monocytes Relative 8  3 - 12 (%)   Monocytes Absolute 0.6  0.1 - 1.0 (K/uL)   Eosinophils Relative 6 (*) 0 - 5 (%)   Eosinophils Absolute 0.4  0.0 - 0.7 (K/uL)   Basophils Relative 0  0 - 1 (%)   Basophils Absolute 0.0  0.0 - 0.1 (K/uL)  BASIC METABOLIC PANEL  Component Value Range   Sodium 138  135 - 145 (mEq/L)   Potassium 3.8  3.5 - 5.1 (mEq/L)   Chloride 104  96 - 112 (mEq/L)   CO2 22  19 - 32 (mEq/L)   Glucose, Bld 149 (*) 70 - 99 (mg/dL)   BUN 16  6 - 23 (mg/dL)   Creatinine, Ser 1.19  0.50 - 1.35 (mg/dL)   Calcium 9.2  8.4 - 14.7 (mg/dL)   GFR calc non Af Amer >60  >60 (mL/min)   GFR calc Af Amer >60  >60 (mL/min)  TROPONIN I      Component Value Range   Troponin I <0.30  <0.30 (ng/mL)   Dg Chest Portable 1 View  01/08/2011  *RADIOLOGY REPORT*  Clinical Data: Non-Hodgkins lymphoma.  COPD.  Hypertension.  PORTABLE CHEST - 1 VIEW  Comparison: 05/09/2009  Findings: Heart size is normal.  Both lungs are clear.  No evidence of pleural effusion.  No mass or lymphadenopathy identified.  The right-sided Port-A-Cath remains in appropriate position. Surgical clips again seen in the both axillary regions.  IMPRESSION: Stable exam.  No active disease.  Original Report Authenticated By: Danae Orleans, M.D.    ED COURSE / COORDINATION OF CARE: 16:49. Cardiac workup ordered prior to my assessment. 17:00. Ordered 1L IV NS. 20:33. Patient requests meal   MDM: Differential Diagnosis: The patient has atypical chest discomfort for cardiac disease. I doubt any ACS, PE, pneumonia, occult infection or metabolic instability. The patient has scheduled short-term followup with his cardiologist in 4 days. His discomfort improved spontaneously and has not recurred in the emergency department and trending of cardiac enzymes is negative.   IMPRESSION: Diagnoses that have  been ruled out:  Diagnoses that are still under consideration:  Final diagnoses:  Chest pain, unspecified     PLAN: discharge The patient is to return the emergency department if there is any worsening of symptoms. I have reviewed the discharge instructions with the pt and family   CONDITION ON DISCHARGE: stable   MEDICATIONS GIVEN IN THE E.D.  Medications  aspirin chewable tablet 324 mg (324 mg Oral Given 01/08/11 2147)  Heparin Lock Flush 100 UNIT/ML injection (500 Units  Given 01/08/11 2148)     DISCHARGE MEDICATIONS: New Prescriptions   No medications on file    Scribe Attestation I personally performed the services described in this documentation, which was scribed in my presence. The recorded information has been reviewed and considered. No att. providers found   Flint Melter, MD 01/09/11 1500

## 2011-01-08 NOTE — Discharge Instructions (Signed)
Followup with Dr. Dietrich Pates as scheduled in 4 days. Return here if you're discomfort worsens or if you have persistent chest pain weakness dizziness or trouble breathing. Take your medications as usual, get plenty of rest, and stay out of the heat.  Aspirin and Your Heart Aspirin affects the way your blood clots and helps "thin" the blood. Aspirin has many uses in heart disease. It may be used as a primary prevention to help reduce the risk of heart related events. It also can be used as a secondary measure to prevent more heart attacks or to prevent additional damage from blood clots.  ASPIRIN MAY HELP IF YOU  Have had a heart attack or chest pain.   Have undergone open heart surgery such as CABG (Coronary Artery Bypass Surgery).   Have had coronary angioplasty with or without stents.   Have experienced a stroke or TIA (transient ischemic attack).   Have peripheral vascular disease (PAD).   Have chronic heart rhythm problems such as atrial fibrillation.   Are at risk for heart disease.  BEFORE STARTING ASPIRIN Before you start taking aspirin, your caregiver will need to review your medical history. Many things will need to be taken into consideration, such as:  Smoking status.  Blood pressure.   Diabetes.   Gender.  Weight.   Cholesterol level.   ASPIRIN DOSES  Aspirin should only be taken on the advice of your caregiver. Talk to your caregiver about how much aspirin you should take. Aspirin comes in different doses such as:   81 mg.   162 mg.   325 mg.   The aspirin dose you take may be affected by many factors, some of which include:   Your current medications, especially if your are taking blood-thinners or anti-platelet medicine.  Liver function.  Heart disease risk.   Age.    Aspirin comes in two forms:   Non-enteric-coated. This type of aspirin does not have a coating and is absorbed faster. Non-enteric coated aspirin is recommended for patients experiencing  chest pain symptoms. This type of aspirin also comes in a chewable form.   Enteric-coated. This means the aspirin has a special coating that releases the medicine very slowly. Enteric-coated aspirin causes less stomach upset. This type of aspirin should not be chewed or crushed.  ASPIRIN SIDE EFFECTS Daily use of aspirin can increase your risk of serious side effects, some of these include:  Increased bleeding. This can range from a cut that does not stop bleeding to more serious problems such as stomach bleeding or bleeding into the brain (Intracerebral bleeding).   Increased bruising.   Stomach upset.   An allergic reaction such as red, itchy skin.   Increased risk of bleeding when combined with non-steroidal anti-inflammatory medicine (NSAIDS).   Alcohol should be drank in moderation when taking aspirin. Alcohol can increase the risk of stomach bleeding when taken with aspirin.   Aspirin should not be given to children less than 85 years of age due to the association of Reye syndrome. Reye syndrome is a serious illness that can affect the brain and liver. Studies have linked Reye syndrome with aspirin use in children.   People that have nasal polyps have an increased risk of developing an aspirin allergy.  SEEK MEDICAL CARE IF:  You develop an allergic reaction such as:   Hives.   Itchy skin.   Swelling of the lips, tongue or face.   You develop stomach pain.   You have unusual bleeding or  bruising.   You have ringing in your ears.  SEEK IMMEDIATE MEDICAL CARE IF:  You have severe chest pain, especially if the pain is crushing or pressure-like and spreads to the arms, back, neck, or jaw. THIS IS AN EMERGENCY. Do not wait to see if the pain will go away. Get medical help at once. Call your local emergency services (911 in the [place] DO NOT drive yourself to the hospital.   You have stroke-like symptoms such as:   Loss of vision.  Difficulty talking.   Numbness or  weakness on one side of your body.   Numbness or weakness in your arm or leg.  Not thinking clearly or feeling confused.    Your bowel movements are bloody, dark red or black in color.   You vomit or cough up blood.   You have blood in your urine.   You have shortness of breath, coughing or wheezing.  MAKE SURE YOU:  Understand these instructions.   Will monitor your condition.   Seek immediate medical care if necessary.  Document Released: 05/11/2008  [facility] Patient Information 2011 [place], [place].Chest Pain (Nonspecific) It is often hard to give a specific diagnosis for the cause of chest pain. There is always a chance that your pain could be related to something serious, such as a heart attack or a blood clot in the lungs. You need to follow up with your caregiver for further evaluation. CAUSES  Heartburn.   Pneumonia or bronchitis.   Anxiety and stress.   Inflammation around your heart (pericarditis) or lung (pleuritis or pleurisy).   A blood clot in the lung.   A collapsed lung (pneumothorax). It can develop suddenly on its own (spontaneous pneumothorax) or from injury (trauma) to the chest.  The chest wall is composed of bones, muscles, and cartilage. Any of these can be the source of the pain.  The bones can be bruised by injury.   The muscles or cartilage can be strained by coughing or overwork.   The cartilage can be affected by inflammation and become sore (costochondritis).  DIAGNOSIS Lab tests or other studies, such as X-rays, an EKG, stress testing, or cardiac imaging, may be needed to find the cause of your pain.  TREATMENT  Treatment depends on what may be causing your chest pain. Treatment may include:   Acid blockers for heartburn.  Anti-inflammatory medicine.   Pain medicine for inflammatory conditions.  Antibiotics if an infection is present.    You may be advised to change lifestyle habits. This includes stopping smoking and avoiding  caffeine and chocolate.   You may be advised to keep your head raised (elevated) when sleeping. This reduces the chance of acid going backward from your stomach into your esophagus.   Most of the time, nonspecific chest pain will improve within 2 to 3 days with rest and mild pain medicine.  HOME CARE INSTRUCTIONS  If antibiotics were prescribed, take the full amount even if you start to feel better.   For the next few days, avoid physical activities that bring on chest pain. Continue physical activities as directed.   Do not smoke cigarettes or drink alcohol until your symptoms are gone.   Only take over-the-counter or prescription medicine for pain, discomfort, or fever as directed by your caregiver.   Follow your caregiver's suggestions for further testing if your chest pain does not go away.   Keep any follow-up appointments you made. If you do not go to an appointment, you could  develop lasting (chronic) problems with pain. If there is any problem keeping an appointment, you must call to reschedule.  SEEK MEDICAL CARE IF:  You think you are having problems from the medicine you are taking. Read your medicine instructions carefully.   Your chest pain does not go away, even after treatment.   You develop a rash with blisters on your chest.  SEEK IMMEDIATE MEDICAL CARE IF:  You have increased chest pain or pain that spreads to your arm, neck, jaw, back, or belly (abdomen).   You develop shortness of breath, an increasing cough, or you are coughing up blood.   You have severe back or abdominal pain, feel sick to your stomach (nauseous) or throw up (vomit).   You develop severe weakness, fainting, or chills.   You have an oral temperature above 102, not controlled by medicine.  THIS IS AN EMERGENCY. Do not wait to see if the pain will go away. Get medical help at once. Call your local emergency services  (911 in [place] Do not drive yourself to the hospital. MAKE SURE  YOU:  Understand these instructions.   Will watch your condition.   Will get help right away if you are not doing well or get worse.  Document Released: 03/08/2005 Document Re-Released: 08/23/2009 [facility] Patient Information 2011 [place], [place].Cardiac Biomarkers Cardiac biomarkers are enzymes, proteins, and hormones that are associated with heart function, damage or failure. Some of the tests are specific for the heart while others are also elevated with skeletal muscle damage. Cardiac biomarkers are used for diagnostic and prognostic purposes and are frequently ordered by caregivers when someone comes into the Emergency Room complaining of symptoms, such as chest pain, pressure, nausea, and shortness of breath. These tests are ordered, along with other laboratory and non-laboratory tests, to detect heart failure (which is often a chronic, progressive condition affecting the ability of the heart to fill with blood and pump efficiently) and the acute coronary syndromes (ACS) as well as to help determine prognosis for people who have had a heart attack. ACS is a group of symptoms that reflect a sudden decrease in the amount of blood and oxygen, also termed 'ischemia,' reaching the heart. This decrease is frequently due to either a narrowing of the coronary arteries (atherosclerosis or vessel spasm) or unstable plaques, which can cause a blood clot (thrombus) and blockage of blood flow. If the oxygen supply is low, it can cause angina (pain); if blood flow is reduced, it can cause death of heart cells (called myocardial infarction or heart attack) and can lead to death of the affected heart muscle cells and to permanent damage and scarring of the heart.  The goal with cardiac biomarkers is to be able to detect the presence and severity of an acute heart condition as soon as possible so that appropriate treatment can be initiated.  There are only a few cardiac biomarkers that are being routinely used by  physicians. Some have been phased out because they are not as specific as the marker of choice - troponin. Many other potential cardiac biomarkers are still being researched but their clinical utility has yet to be established.  Note: Cardiac biomarkers are not the same tests as those that are used to screen the general healthy population for their risk of developing heart disease. Those can be found under Cardiac Risk Assessment. LABORATORY TESTS CURRENT CARDIAC BIOMARKERS   CK and CK-MB   Troponin    BNP or (NT-proBNP)   Myoglobin (not  always used; sometimes ordered with troponin)   MORE GENERAL TESTS FREQUENTLY ORDERED ALONG WITH CARDIAC BIOMARKERS   Blood Gases   CMP   BMP   Electrolytes   CBC   ON THE HORIZON Ischemia modified albumin (IMA) - Test has received FDA approval for use with troponin and electrocardiogram to rule out acute coronary syndrome (ACS) in patients with chest pain. May become useful for identifying patients at higher risk of heart attack and potentially could replace myoglobin one day.  NON-LABORATORY TESTS These tests allow caregivers to look at the size, shape, and function of the heart as it is beating. They can be used to detect changes to the rhythm of the heart as well as to detect and evaluate damaged tissues and blocked arteries.   EKG (ECG, electrocardiogram)   Nuclear scan    Coronary angiography (or arteriography)   ECG (echocardiogram)    Stress testing   Chest X-ray   THE FOLLOWING TABLE SUMMARIZES CURRENTLY USED CARDIAC BIOMARKERS. Marker What Where Found What Indicates Time to Increase Time back to Normal When/How Used  CK Enzyme that exists in three different isoforms Heart, brain, and skeletal muscle Injury to muscle cells 4 to 6 hours after injury, peaks in 18 to 24 hours Normal in 48 to 72 hours, unless due to continuing injury  Being phased out, may be ordered prior to CK-MB  CK-MB Heart- related portion of total CK enzyme Heart primarily,  but also in skeletal muscle Injury (cell death) to heart 4 to 6 hrs after heart attack, peaks in 12 to 20 hours Returns to normal in 24 to 48 hours unless new/continual damage Not as specific as Troponin for heart injury/attack, may be ordered when Troponin is not available, may be ordered to monitor new/continuing damage  Myoglobin Small oxygen-storing protein Heart and other muscle cells Injury to heart or other muscle cells. Also elevated with kidney problems. Starts to rise within 2 to 3 hours, peaks in 8 to 12 hours. Falls back to normal by about one day after injury occurred Ordered along with Troponin, helps diagnose heart injury/attack  Cardiac Troponin Components of a Regulatory protein complex. Two cardiac specific isoforms: T  and I  Heart muscle Heart injury/damage 4 to 8 hours Remains elevated for 7 to 14 days Ordered to help assess prognosis and diagnose heart attack  LDH Enzyme Almost all body tissues General marker of injury to cells   Phased out, not specific  AST> Enzyme Heart, liver, and muscles Injury to liver, muscle, or heart   Phased out, not heart-specific  Hs-CRP Protein Associated with athero- sclerosis  Inflam-matory process  Elevated with inflammation May help determine prognosis of patients who've had heart attack  BNP Hormone Heart's left ventricle Heart failure  Elevation related to severity Help diagnose and evaluate heart failure, prognosis, and to monitor therapy  FOR MORE INFORMATION, VISIT: [email or web address] Document Released: 06/21/2004  [facility] Patient Information 2011 [place], [place].Electrocardiogram (EKG) An electrocardiogram is also known as an EKG or ECG. An EKG records the electrical impulses of the heart and is an important test to assess heart health. It provides insight regarding your heart rhythm, heart size, heart function and can can provide information as to whether you have had a heart attack. An EKG may be part of a routine physical exam  and is done if you have experienced chest pain.  PROCEDURE  You will be asked to remove your clothes from the waist up, wear a hospital gown and lie  down on your back for the test.   Electrodes or sticky patches are placed on your arms, legs and chest.   The electrodes are attached by wires to an EKG machine which records the electrical activity of your heart.   You will be asked to relax and lie still for a few seconds.   The EKG test is simple, safe and painless. It takes only a few minutes to perform the test. You cannot be shocked by the machine and no electricity goes through your body.  AFTER THE EKG  If the EKG was part of a routine physical exam, you may return to normal activities as told by your caregiver.   If the EKG was done to evaluate chest pain, you may need additional testing as determined by your caregiver for your safety.   Your doctor or a specially trained heart doctor (Cardiologist) will read the EKG results.   Not all test results are available during your visit. If your test results are not back during the visit, make an appointment with your caregiver to find out the results. Do not assume everything is normal if you have not heard from your caregiver or the medical facility. It is important for you to follow up on all of your test results.  SEEK IMMEDIATE MEDICAL CARE IF  You have severe chest discomfort, especially if the pain is crushing or pressure-like and spreads to the arms, back, neck, or jaw. THIS IS AN EMERGENCY. Do not wait to see if the pain will go away. Get medical help at once. Call emergency services ( 911 in [place] DO NOT drive yourself to the hospital.   You are sweating, feel sick to your stomach(nausea), or short of breath.   You have chest pain that does not get better after rest or taking your usual medicine.   You wake from sleep with chest pain.   You feel dizzy, faint or experience profound fatigue.  Document Released: 05/26/2000  Document Re-Released: 03/31/2008 [facility] Patient Information 2011 [place], [place].

## 2011-01-08 NOTE — ED Notes (Signed)
Port deaccessed for discharge. Needle intact. No signs of infiltration.

## 2011-01-08 NOTE — ED Notes (Signed)
PT REPORT GIVEN TO ONCOMING SHIFT. ASSUMING CARE OF PT. INTO ROOM TO SEE PT. INTRODUCED SELF. WIFE AT BS. PT SITTING UP IN BED WITH EYES OPEN. A&O X 4. DENIES ANY NEEDS. STATES STILL HAVING SOME DISCOMFORT IN NECK BUT IS A LOT BETTER THAN IT WAS WHEN HE FIRST CAME IN. PT DENIES ANY NEEDS AT THIS TIME. NS CONTINUES TO INFUSE WELL IN PORT IN RIGHT CHEST. DENIES ANY NEEDS AT THIS TIME. CALL BELL AT BS PENDING MD EVAL.

## 2011-01-08 NOTE — ED Notes (Signed)
MD AT BS TO SPEAK WITH PT ABOUT PLAN OF CARE. 

## 2011-01-08 NOTE — ED Notes (Addendum)
PT RESTING WITH EYES OPEN AND LIGHTS ON. NAD. EQUAL CHEST RISE AND FALL. DENIES PAIN. DENIES ANY NEEDS AT THIS TIME. CALL BELL AT BS. WIFE AT BS.

## 2011-01-08 NOTE — ED Notes (Signed)
RESTING SITTING UP IN BED. DENIES NEEDS. DENIES PAIN. EQUAL CHEST RISE AND FALL. A&O X 4. FAMILY AT BS. CALL BELL AT BS.

## 2011-01-08 NOTE — ED Notes (Signed)
PT UP TO BEDSIDE WITH URINAL. STEADY ON FEET. NAD. DENIES ANY NEEDS AT THIS TIME. CALL BELL AT BS. PLACED BACK IN BED.

## 2011-01-08 NOTE — ED Notes (Addendum)
Pt states that he was sitting up on couch after eating when he began to experience an :"oddness" feeling to neck and left shoulder, per pt he did not have any chest pain, spouse states that pt expressed a chest tightness to her when the episode first started,  Denies any sob, n/v, diaphoresis, pt on monitor sinus brady with a rate of 59

## 2011-01-11 ENCOUNTER — Ambulatory Visit (INDEPENDENT_AMBULATORY_CARE_PROVIDER_SITE_OTHER): Payer: Medicare Other | Admitting: Cardiology

## 2011-01-11 ENCOUNTER — Encounter: Payer: Self-pay | Admitting: Cardiology

## 2011-01-11 DIAGNOSIS — I2510 Atherosclerotic heart disease of native coronary artery without angina pectoris: Secondary | ICD-10-CM | POA: Insufficient documentation

## 2011-01-11 DIAGNOSIS — IMO0001 Reserved for inherently not codable concepts without codable children: Secondary | ICD-10-CM

## 2011-01-11 DIAGNOSIS — I739 Peripheral vascular disease, unspecified: Secondary | ICD-10-CM

## 2011-01-11 DIAGNOSIS — I679 Cerebrovascular disease, unspecified: Secondary | ICD-10-CM

## 2011-01-11 DIAGNOSIS — M5030 Other cervical disc degeneration, unspecified cervical region: Secondary | ICD-10-CM

## 2011-01-11 DIAGNOSIS — I10 Essential (primary) hypertension: Secondary | ICD-10-CM

## 2011-01-11 DIAGNOSIS — C8589 Other specified types of non-Hodgkin lymphoma, extranodal and solid organ sites: Secondary | ICD-10-CM

## 2011-01-11 DIAGNOSIS — D649 Anemia, unspecified: Secondary | ICD-10-CM | POA: Insufficient documentation

## 2011-01-11 DIAGNOSIS — M791 Myalgia, unspecified site: Secondary | ICD-10-CM | POA: Insufficient documentation

## 2011-01-11 DIAGNOSIS — F17200 Nicotine dependence, unspecified, uncomplicated: Secondary | ICD-10-CM

## 2011-01-11 MED ORDER — ATORVASTATIN CALCIUM 40 MG PO TABS: 40.0000 mg | ORAL_TABLET | Freq: Every day | ORAL | Status: DC

## 2011-01-11 NOTE — Assessment & Plan Note (Signed)
Patient has had a minimal chronic anemia in the past with hemoglobin in the 12-13 range and hematocrits of 36-38.  Most recent CBC was slightly worse with hemoglobin of 12 and hematocrit of 35.  He is scheduled for reevaluation by Dr. Mariel Sleet within the next week for followup of lymphoma.  Anemia can be addressed at that time.

## 2011-01-11 NOTE — Assessment & Plan Note (Signed)
Patient has not had neurologic symptoms and has had multiple negative carotid examinations over the course of 15 years.  I would be inclined to reserve additional studies for the development of symptoms.

## 2011-01-11 NOTE — Progress Notes (Signed)
HPI : [name] is seen in the office today at his request following evaluation in the emergency department a few days ago.  He presented with neck and shoulder discomfort with some radiation to the chest that was thought by the Emergency Department physician to be noncardiac in nature.  There may have been an association with movement of the neck and trunk.  Symptoms resolved in the emergency department.  EKG showed no acute abnormalities.  Chest x-ray was normal as were cardiac markers.  Other laboratory studies were unremarkable.  There is been no recurrence of symptoms over the past few days.  Current Outpatient Prescriptions on File Prior to Visit  Medication Sig Dispense Refill  . amLODipine (NORVASC) 5 MG tablet Take 1 tablet (5 mg total) by mouth daily.  30 tablet  6  . aspirin 81 MG EC tablet Take 81 mg by mouth daily.        Marland Kitchen atorvastatin (LIPITOR) 40 MG tablet Take 1 tablet (40 mg total) by mouth daily.  30 tablet  6  . furosemide (LASIX) 40 MG tablet Take 40 mg by mouth daily.        Marland Kitchen ibuprofen (ADVIL,MOTRIN) 200 MG tablet Take 200 mg by mouth every 6 (six) hours as needed.        Marland Kitchen lisinopril (PRINIVIL,ZESTRIL) 20 MG tablet Take 20 mg by mouth daily.        . potassium chloride (K-DUR,KLOR-CON) 10 MEQ tablet Take 10 mEq by mouth daily.           No Known Allergies    Past medical history, social history, and family history reviewed and updated.  ROS: Denies orthopnea, PND, lightheadedness, syncope or pedal edema.  No cough nor sputum production.  PHYSICAL EXAM: BP 130/64  Pulse 61  Ht 5\' 6"  (1.676 m)  Wt 79.833 kg (176 lb)  BMI 28.41 kg/m2  General-Well developed; no acute distress Body habitus-proportionate weight and height Neck-No JVD; left carotid bruit Lungs-clear lung fields; resonant to percussion Cardiovascular-normal PMI; normal S1 and S2; grade 2/6 systolic ejection murmur at the left sternal border Abdomen-normal bowel sounds; soft and non-tender without  masses or organomegaly Musculoskeletal-No deformities, no cyanosis or clubbing Neurologic-Normal cranial nerves; symmetric strength and tone Skin-Warm, no significant lesions Extremities-distal pulses intact; trace edema  ASSESSMENT AND PLAN:

## 2011-01-11 NOTE — Patient Instructions (Addendum)
Your physician recommends that you schedule a follow-up appointment in: 1 YEAR Your physician has recommended you make the following change in your medication: STOP SIMVASTATIN AND BEGIN ATORVASTATIN 40MG  AT BEDTIME

## 2011-01-11 NOTE — Assessment & Plan Note (Signed)
Blood pressure control is good; current medications will be continued. 

## 2011-01-11 NOTE — Assessment & Plan Note (Signed)
Chest discomfort was atypical and likely musculoskeletal in origin.  Our focus will continue to be on optimal control of cardiovascular risk factors.

## 2011-01-12 ENCOUNTER — Other Ambulatory Visit (HOSPITAL_COMMUNITY): Payer: Self-pay | Admitting: Oncology

## 2011-01-12 ENCOUNTER — Ambulatory Visit (HOSPITAL_COMMUNITY)
Admission: RE | Admit: 2011-01-12 | Discharge: 2011-01-12 | Disposition: A | Discharge status: Home / Self Care | Payer: Medicare Other | Source: Ambulatory Visit | Attending: Oncology | Admitting: Oncology

## 2011-01-12 ENCOUNTER — Encounter (HOSPITAL_COMMUNITY): Payer: Medicare Other | Attending: Oncology | Admitting: Oncology

## 2011-01-12 ENCOUNTER — Encounter (HOSPITAL_COMMUNITY): Payer: Self-pay | Admitting: Oncology

## 2011-01-12 VITALS — BP 159/70 | HR 61 | Temp 98.3°F | Wt 177.2 lb

## 2011-01-12 DIAGNOSIS — D649 Anemia, unspecified: Secondary | ICD-10-CM | POA: Insufficient documentation

## 2011-01-12 DIAGNOSIS — C8589 Other specified types of non-Hodgkin lymphoma, extranodal and solid organ sites: Secondary | ICD-10-CM

## 2011-01-12 DIAGNOSIS — R591 Generalized enlarged lymph nodes: Secondary | ICD-10-CM

## 2011-01-12 DIAGNOSIS — Z87898 Personal history of other specified conditions: Secondary | ICD-10-CM | POA: Insufficient documentation

## 2011-01-12 DIAGNOSIS — R599 Enlarged lymph nodes, unspecified: Secondary | ICD-10-CM | POA: Insufficient documentation

## 2011-01-12 DIAGNOSIS — C8330 Diffuse large B-cell lymphoma, unspecified site: Secondary | ICD-10-CM

## 2011-01-12 LAB — CBC
MCH: 31.6 pg (ref 26.0–34.0)
MCV: 94.1 fL (ref 78.0–100.0)
Platelets: 227 10*3/uL (ref 150–400)
RDW: 14.9 % (ref 11.5–15.5)
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
## 2011-01-12 NOTE — Patient Instructions (Addendum)
[facility]  Discharge Instructions  RECOMMENDATIONS MADE BY THE CONSULTANT AND ANY TEST RESULTS WILL BE SENT TO YOUR REFERRING DOCTOR.   EXAM FINDINGS BY MD TODAY AND SIGNS AND SYMPTOMS TO REPORT TO CLINIC OR PRIMARY MD:   Elijah Birk felt a lymph node in the left axilla (armpit) area. We are sending you down to the radiology department to have a ultrasound today.  We are also going to draw blood today.    SPECIAL INSTRUCTIONS/FOLLOW-UP: Return to Clinic in 6 months.    I acknowledge that I have been informed and understand all the instructions given to me and received a copy. I do not have any more questions at this time, but understand that I may call the Specialty Clinic at [facility] at [phone] during business hours should I have any further questions or need assistance in obtaining follow-up care.    __________________________________________  _____________  __________ Signature of Patient or Authorized Representative            Date                   Time    __________________________________________ Nurse's Signature

## 2011-01-13 ENCOUNTER — Ambulatory Visit (HOSPITAL_COMMUNITY): Payer: Medicare Other | Admitting: Oncology

## 2011-01-13 ENCOUNTER — Ambulatory Visit (HOSPITAL_COMMUNITY): Payer: MEDICARE

## 2011-01-13 LAB — IRON AND TIBC
Saturation Ratios: 25 % (ref 20–55)
TIBC: 308 ug/dL (ref 215–435)
UIBC: 232 ug/dL

## 2011-01-13 LAB — FOLATE: Folate: 9.1 ng/mL

## 2011-01-31 ENCOUNTER — Telehealth: Payer: Self-pay | Admitting: Cardiology

## 2011-01-31 NOTE — Telephone Encounter (Signed)
PT USED TO BE ON ZOCOR AND WAS SWITCHED TO LIPITOR AND WANTS TO KNOW IF THEY CAUSE THEY SAME REACTIONS HE IS STILL HAVE SORENESS IN MUSCLES.

## 2011-02-01 NOTE — Telephone Encounter (Signed)
Pt made aware that both are cholesterol medication and to try to continue medication if possible He states he started an otc medication for the cramping and will call if no relief

## 2011-02-10 ENCOUNTER — Other Ambulatory Visit (HOSPITAL_COMMUNITY): Payer: MEDICARE

## 2011-02-15 ENCOUNTER — Encounter (HOSPITAL_COMMUNITY): Payer: Medicare Other | Attending: Oncology

## 2011-02-15 DIAGNOSIS — Z79899 Other long term (current) drug therapy: Secondary | ICD-10-CM | POA: Insufficient documentation

## 2011-02-15 DIAGNOSIS — C8589 Other specified types of non-Hodgkin lymphoma, extranodal and solid organ sites: Secondary | ICD-10-CM

## 2011-02-15 DIAGNOSIS — Z452 Encounter for adjustment and management of vascular access device: Secondary | ICD-10-CM

## 2011-02-15 DIAGNOSIS — C8584 Other specified types of non-Hodgkin lymphoma, lymph nodes of axilla and upper limb: Secondary | ICD-10-CM | POA: Insufficient documentation

## 2011-02-15 DIAGNOSIS — Z9221 Personal history of antineoplastic chemotherapy: Secondary | ICD-10-CM | POA: Insufficient documentation

## 2011-02-15 MED ORDER — HEPARIN SOD (PORK) LOCK FLUSH 100 UNIT/ML IV SOLN
500.0000 [IU] | Freq: Once | INTRAVENOUS | Status: AC
  Administered 2011-02-15: 500 [IU] via INTRAVENOUS
  Filled 2011-02-15: qty 5

## 2011-02-15 MED ORDER — SODIUM CHLORIDE 0.9 % IJ SOLN
INTRAMUSCULAR | Status: AC
  Administered 2011-02-15: 10 mL via INTRAVENOUS
  Filled 2011-02-15: qty 10

## 2011-02-15 MED ORDER — HEPARIN SOD (PORK) LOCK FLUSH 100 UNIT/ML IV SOLN
INTRAVENOUS | Status: AC
  Administered 2011-02-15: 500 [IU] via INTRAVENOUS
  Filled 2011-02-15: qty 5

## 2011-02-15 MED ORDER — SODIUM CHLORIDE 0.9 % IJ SOLN
10.0000 mL | Freq: Once | INTRAMUSCULAR | Status: AC
  Administered 2011-02-15: 10 mL via INTRAVENOUS
  Filled 2011-02-15: qty 10

## 2011-02-15 NOTE — Progress Notes (Signed)
Curtis Lang presented for Portacath access and flush. Proper placement of portacath confirmed by CXR. Portacath located right chest wall accessed with  H 20 needle. Good blood return present. Portacath flushed with 20ml NS and 500U/59ml Heparin and needle removed intact. Procedure without incident. Patient tolerated procedure well.

## 2011-03-01 LAB — CBC
HCT: 38.8 — ABNORMAL LOW
Hemoglobin: 13.1
MCHC: 33.7
MCV: 92.7
Platelets: 196
RBC: 4.19 — ABNORMAL LOW
RDW: 15.3
WBC: 5.8

## 2011-03-03 LAB — DIFFERENTIAL
Basophils Absolute: 0
Eosinophils Relative: 7 — ABNORMAL HIGH
Lymphs Abs: 1.9
Neutrophils Relative %: 53

## 2011-03-03 LAB — COMPREHENSIVE METABOLIC PANEL
ALT: 14
Alkaline Phosphatase: 83
BUN: 20
CO2: 29
Calcium: 8.7
GFR calc non Af Amer: >60
Glucose, Bld: 120 — ABNORMAL HIGH
Sodium: 137

## 2011-03-03 LAB — IMMUNOFIXATION ELECTROPHORESIS
IgA: 459 — ABNORMAL HIGH
IgG (Immunoglobin G), Serum: 1490
IgM, Serum: 63
Total Protein ELP: 7.3

## 2011-03-03 LAB — CBC
HCT: 37 — ABNORMAL LOW
Hemoglobin: 12.9 — ABNORMAL LOW
MCHC: 34.9
RBC: 4 — ABNORMAL LOW

## 2011-03-07 LAB — COMPREHENSIVE METABOLIC PANEL
AST: 17
Albumin: 3.6
Alkaline Phosphatase: 79
BUN: 24 — ABNORMAL HIGH
Chloride: 101
GFR calc Af Amer: >60
Potassium: 4.2
Sodium: 135
Total Bilirubin: 0.4
Total Protein: 7.3

## 2011-03-07 LAB — CBC
Platelets: 192
RDW: 14.4
WBC: 6.8

## 2011-03-07 LAB — DIFFERENTIAL
Basophils Absolute: 0
Basophils Relative: 1
Eosinophils Relative: 5
Monocytes Absolute: 0.6
Monocytes Relative: 9
Neutro Abs: 3.9

## 2011-03-09 LAB — LACTATE DEHYDROGENASE: LDH: 154

## 2011-03-09 LAB — COMPREHENSIVE METABOLIC PANEL
BUN: 29 — ABNORMAL HIGH
CO2: 27
Calcium: 9.2
Chloride: 101
Creatinine, Ser: 1.35
GFR calc non Af Amer: 52 — ABNORMAL LOW
Total Bilirubin: 0.5

## 2011-03-09 LAB — SEDIMENTATION RATE: Sed Rate: 35 — ABNORMAL HIGH

## 2011-03-09 LAB — DIFFERENTIAL
Basophils Absolute: 0
Lymphocytes Relative: 30
Lymphs Abs: 1.8
Neutro Abs: 3.3

## 2011-03-09 LAB — CBC
HCT: 35.9 — ABNORMAL LOW
MCHC: 34.8
MCV: 94.5
Platelets: 205
RBC: 3.8 — ABNORMAL LOW
WBC: 6.1

## 2011-03-10 LAB — DIFFERENTIAL
Basophils Relative: 0
Eosinophils Absolute: 0.4
Eosinophils Relative: 6 — ABNORMAL HIGH
Lymphs Abs: 1.7

## 2011-03-10 LAB — CBC
RBC: 3.78 — ABNORMAL LOW
WBC: 5.8

## 2011-03-10 LAB — COMPREHENSIVE METABOLIC PANEL
ALT: 17
AST: 19
Alkaline Phosphatase: 75
CO2: 26
Calcium: 8.6
Chloride: 103
GFR calc Af Amer: >60
GFR calc non Af Amer: 54 — ABNORMAL LOW
Potassium: 4
Sodium: 135

## 2011-03-10 LAB — SEDIMENTATION RATE: Sed Rate: 17 — ABNORMAL HIGH

## 2011-03-16 LAB — COMPREHENSIVE METABOLIC PANEL
CO2: 28 mEq/L (ref 19–32)
Calcium: 9.2 mg/dL (ref 8.4–10.5)
Creatinine, Ser: 1.23 mg/dL (ref 0.4–1.5)
GFR calc Af Amer: >60 mL/min (ref >60)
GFR calc non Af Amer: 57 mL/min — ABNORMAL LOW (ref >60)
Glucose, Bld: 101 mg/dL — ABNORMAL HIGH (ref 70–99)

## 2011-03-16 LAB — DIFFERENTIAL
Lymphocytes Relative: 31 % (ref 12–46)
Lymphs Abs: 2.1 10*3/uL (ref 0.7–4.0)
Neutrophils Relative %: 53 % (ref 43–77)

## 2011-03-16 LAB — CBC
Hemoglobin: 13.4 g/dL (ref 13.0–17.0)
MCHC: 33.9 g/dL (ref 30.0–36.0)
MCV: 94.9 fL (ref 78.0–100.0)
RBC: 4.15 MIL/uL — ABNORMAL LOW (ref 4.22–5.81)

## 2011-03-17 LAB — DIFFERENTIAL
Basophils Absolute: 0.1
Basophils Relative: 1
Eosinophils Absolute: 0.8 — ABNORMAL HIGH
Eosinophils Relative: 10 — ABNORMAL HIGH
Lymphocytes Relative: 26
Lymphs Abs: 2.1
Monocytes Absolute: 0.8
Monocytes Relative: 10
Neutro Abs: 4.3
Neutrophils Relative %: 54

## 2011-03-17 LAB — COMPREHENSIVE METABOLIC PANEL WITH GFR
ALT: 12
AST: 16
Albumin: 3.6
Alkaline Phosphatase: 90
BUN: 18
CO2: 28
Calcium: 8.8
Chloride: 104
Creatinine, Ser: 1.12
GFR calc Af Amer: >60
GFR calc non Af Amer: >60
Glucose, Bld: 102 — ABNORMAL HIGH
Potassium: 3.6
Sodium: 139
Total Bilirubin: 0.5
Total Protein: 7.4

## 2011-03-17 LAB — CBC
Hemoglobin: 12.4 — ABNORMAL LOW
MCHC: 33.7
MCV: 92.7
RBC: 3.96 — ABNORMAL LOW

## 2011-03-21 LAB — BASIC METABOLIC PANEL
CO2: 27
Calcium: 8.3 — ABNORMAL LOW
GFR calc Af Amer: >60
GFR calc non Af Amer: >60
Sodium: 134 — ABNORMAL LOW

## 2011-03-21 LAB — DIFFERENTIAL
Lymphocytes Relative: 21
Lymphs Abs: 2
Monocytes Absolute: 0.8
Monocytes Relative: 9
Neutro Abs: 6.2

## 2011-03-21 LAB — CBC
Hemoglobin: 11.3 — ABNORMAL LOW
RBC: 3.59 — ABNORMAL LOW
WBC: 9.4

## 2011-03-22 LAB — CBC
HCT: 34.4 — ABNORMAL LOW
Hemoglobin: 11.9 — ABNORMAL LOW
MCHC: 34.6
RBC: 3.74 — ABNORMAL LOW
RDW: 15.9 — ABNORMAL HIGH

## 2011-03-23 LAB — CBC
MCHC: 33.6
MCV: 91.6
RDW: 15.3 — ABNORMAL HIGH

## 2011-03-24 LAB — CBC
MCHC: 33.5
Platelets: 202
RBC: 4.24
WBC: 6

## 2011-03-27 LAB — CBC
HCT: 39.7
MCV: 94.4
Platelets: 284
RBC: 4.2 — ABNORMAL LOW
WBC: 6.1

## 2011-03-29 ENCOUNTER — Other Ambulatory Visit (HOSPITAL_COMMUNITY): Payer: Medicare Other

## 2011-03-29 LAB — DIFFERENTIAL
Band Neutrophils: 0
Basophils Absolute: 0.2 — ABNORMAL HIGH
Basophils Relative: 2 — ABNORMAL HIGH
Eosinophils Absolute: 0.6
Eosinophils Relative: 6 — ABNORMAL HIGH
Metamyelocytes Relative: 0
Myelocytes: 0
Neutrophils Relative %: 62
Promyelocytes Absolute: 0

## 2011-03-29 LAB — BASIC METABOLIC PANEL
CO2: 26
Chloride: 101
Creatinine, Ser: 1.25
GFR calc Af Amer: >60
Potassium: 4.3

## 2011-03-29 LAB — CBC
HCT: 35.4 — ABNORMAL LOW
MCHC: 34
MCV: 85.4
RBC: 4.15 — ABNORMAL LOW
WBC: 10.7 — ABNORMAL HIGH

## 2011-03-31 ENCOUNTER — Encounter (HOSPITAL_COMMUNITY): Payer: Medicare Other | Attending: Oncology

## 2011-03-31 DIAGNOSIS — Z452 Encounter for adjustment and management of vascular access device: Secondary | ICD-10-CM

## 2011-03-31 DIAGNOSIS — C8330 Diffuse large B-cell lymphoma, unspecified site: Secondary | ICD-10-CM

## 2011-03-31 DIAGNOSIS — C8581 Other specified types of non-Hodgkin lymphoma, lymph nodes of head, face, and neck: Secondary | ICD-10-CM

## 2011-03-31 DIAGNOSIS — C8589 Other specified types of non-Hodgkin lymphoma, extranodal and solid organ sites: Secondary | ICD-10-CM | POA: Insufficient documentation

## 2011-03-31 MED ORDER — HEPARIN SOD (PORK) LOCK FLUSH 100 UNIT/ML IV SOLN
INTRAVENOUS | Status: AC
  Administered 2011-03-31: 500 [IU] via INTRAVENOUS
  Filled 2011-03-31: qty 5

## 2011-03-31 MED ORDER — HEPARIN SOD (PORK) LOCK FLUSH 100 UNIT/ML IV SOLN
500.0000 [IU] | Freq: Once | INTRAVENOUS | Status: AC
  Administered 2011-03-31: 500 [IU] via INTRAVENOUS
  Filled 2011-03-31: qty 5

## 2011-03-31 NOTE — Progress Notes (Signed)
Tolerated well

## 2011-05-09 ENCOUNTER — Encounter (HOSPITAL_COMMUNITY): Payer: Self-pay | Admitting: Pharmacy Technician

## 2011-05-12 ENCOUNTER — Encounter (HOSPITAL_COMMUNITY): Payer: Medicare Other

## 2011-05-15 MED ORDER — SODIUM CHLORIDE 0.45 % IV SOLN
Freq: Once | INTRAVENOUS | Status: AC
  Administered 2011-05-16: 08:00:00 via INTRAVENOUS

## 2011-05-15 NOTE — H&P (Signed)
NTS SOAP Note  Vital Signs:  Vitals as of: 04/13/2011: Systolic 121: Diastolic 86: Heart Rate 58: Temp 96.27F: Height 82ft 6in: Weight 178Lbs 0 Ounces: Pain Level 2: BMI 29  BMI : 28.73 kg/m2  Subjective: This 77 Years 75 Months old Male presents for of episode of blood in stools.  One episode noted.  Denies any other gi complaints.  Never has had a colonoscopy.  Review of Symptoms:  Constitutional:unremarkable Head:unremarkable Eyes:unremarkable Nose/Mouth/Throat:unremarkable Cardiovascular:unremarkable Respiratory:unremarkable Gastrointestinal:unremarkable Genitourinary:unremarkable Musculoskeletal:unremarkable Skin:unremarkable Hematolgic/Lymphatic:unremarkable Allergic/Immunologic:unremarkable   Past Medical History:Reviewed   Past Medical History  Surgical History: hernia, neck Medical Problems:  High Blood pressure, High cholesterol Allergies: pain pills, nexium Medications: ASA, kdur, lasix, lisinopril, zocor   Social History:Reviewed   Social History  Preferred Language: English ([place]) Race:  White Ethnicity: Not Hispanic / Latino Age: 77 Years 7 Months Marital Status:  W Alcohol:  No Recreational drug(s):  No   Smoking Status: Former smoker reviewed on 04/13/2011 Started Date: 06/12/1950 Stopped Date: 06/13/2003   Family History:Reviewed   Family History  Is there a family history of:No family h/o colon carcinoma    Objective Information: General:Well appearing, well nourished in no distress. Head:Atraumatic; no masses; no abnormalities Neck:Supple without lymphadenopathy.  Heart:RRR, no murmur or gallop.  Normal S1, S2.  No S3, S4.  Lungs:CTA bilaterally, no wheezes, rhonchi, rales.  Breathing unlabored. Abdomen:Soft, NT/ND, normal bowel sounds, no HSM, no masses.  No peritoneal signs. deferred to procedure  Assessment:Need for screening TCS.  Diagnosis  &amp; Procedure: DiagnosisCode: V76.51, ProcedureCode: 40981,    Plan:Scheduled for TCS on 05/16/11.   Patient Education:Alternative treatments to surgery were discussed with patient (and family).Risks and benefits  of procedure were fully explained to the patient (and family) who gave informed consent. Patient/family questions were addressed.  Follow-up:Pending Surgery

## 2011-05-16 ENCOUNTER — Encounter (HOSPITAL_COMMUNITY)
Admission: RE | Disposition: A | Discharge status: Home / Self Care | Payer: Self-pay | Source: Ambulatory Visit | Attending: General Surgery

## 2011-05-16 ENCOUNTER — Encounter (HOSPITAL_COMMUNITY): Payer: Self-pay | Admitting: *Deleted

## 2011-05-16 ENCOUNTER — Ambulatory Visit (HOSPITAL_COMMUNITY)
Admission: RE | Admit: 2011-05-16 | Discharge: 2011-05-16 | Disposition: A | Discharge status: Home / Self Care | Payer: Medicare Other | Source: Ambulatory Visit | Attending: General Surgery | Admitting: General Surgery

## 2011-05-16 DIAGNOSIS — Z1211 Encounter for screening for malignant neoplasm of colon: Secondary | ICD-10-CM | POA: Insufficient documentation

## 2011-05-16 DIAGNOSIS — Z7982 Long term (current) use of aspirin: Secondary | ICD-10-CM | POA: Insufficient documentation

## 2011-05-16 DIAGNOSIS — I10 Essential (primary) hypertension: Secondary | ICD-10-CM | POA: Insufficient documentation

## 2011-05-16 DIAGNOSIS — D126 Benign neoplasm of colon, unspecified: Secondary | ICD-10-CM | POA: Insufficient documentation

## 2011-05-16 DIAGNOSIS — K5730 Diverticulosis of large intestine without perforation or abscess without bleeding: Secondary | ICD-10-CM | POA: Insufficient documentation

## 2011-05-16 DIAGNOSIS — Z79899 Other long term (current) drug therapy: Secondary | ICD-10-CM | POA: Insufficient documentation

## 2011-05-16 DIAGNOSIS — E7800 Pure hypercholesterolemia, unspecified: Secondary | ICD-10-CM | POA: Insufficient documentation

## 2011-05-16 HISTORY — PX: COLONOSCOPY: SHX5424

## 2011-05-16 HISTORY — DX: Pure hypercholesterolemia, unspecified: E78.00

## 2011-05-16 SURGERY — COLONOSCOPY
Anesthesia: Moderate Sedation

## 2011-05-16 MED ORDER — MIDAZOLAM HCL 5 MG/5ML IJ SOLN
INTRAMUSCULAR | Status: DC | PRN
  Administered 2011-05-16: 2 mg via INTRAVENOUS

## 2011-05-16 MED ORDER — MEPERIDINE HCL 50 MG/ML IJ SOLN
INTRAMUSCULAR | Status: AC
  Filled 2011-05-16: qty 1

## 2011-05-16 MED ORDER — MIDAZOLAM HCL 5 MG/5ML IJ SOLN
INTRAMUSCULAR | Status: AC
  Filled 2011-05-16: qty 5

## 2011-05-16 MED ORDER — MEPERIDINE HCL 25 MG/ML IJ SOLN
INTRAMUSCULAR | Status: DC | PRN
  Administered 2011-05-16: 50 mg via INTRAVENOUS

## 2011-05-16 NOTE — Interval H&P Note (Signed)
History and Physical Interval Note:  05/16/2011 8:56 AM  Curtis Lang  has presented today for surgery, with the diagnosis of screening  The various methods of treatment have been discussed with the patient and family. After consideration of risks, benefits and other options for treatment, the patient has consented to  Procedure(s): COLONOSCOPY as a surgical intervention .  The patients' history has been reviewed, patient examined, no change in status, stable for surgery.  I have reviewed the patients' chart and labs.  Questions were answered to the patient's satisfaction.     Franky Macho A

## 2011-05-23 ENCOUNTER — Ambulatory Visit: Admit: 2011-05-23 | Payer: Self-pay | Admitting: General Surgery

## 2011-05-23 SURGERY — COLONOSCOPY
Anesthesia: Moderate Sedation

## 2011-05-25 ENCOUNTER — Encounter (HOSPITAL_COMMUNITY): Payer: Self-pay | Admitting: General Surgery

## 2011-06-09 ENCOUNTER — Other Ambulatory Visit: Payer: Self-pay | Admitting: *Deleted

## 2011-06-09 MED ORDER — FUROSEMIDE 40 MG PO TABS: 40.0000 mg | ORAL_TABLET | Freq: Every day | ORAL | Status: DC

## 2011-07-17 ENCOUNTER — Ambulatory Visit (HOSPITAL_COMMUNITY): Payer: Medicare Other | Admitting: Oncology

## 2011-07-25 ENCOUNTER — Encounter (HOSPITAL_COMMUNITY): Payer: Medicare Other | Attending: Oncology | Admitting: Oncology

## 2011-07-25 VITALS — BP 136/70 | HR 58 | Temp 98.1°F | Wt 172.8 lb

## 2011-07-25 DIAGNOSIS — Z09 Encounter for follow-up examination after completed treatment for conditions other than malignant neoplasm: Secondary | ICD-10-CM | POA: Insufficient documentation

## 2011-07-25 DIAGNOSIS — C8581 Other specified types of non-Hodgkin lymphoma, lymph nodes of head, face, and neck: Secondary | ICD-10-CM

## 2011-07-25 DIAGNOSIS — R609 Edema, unspecified: Secondary | ICD-10-CM | POA: Insufficient documentation

## 2011-07-25 DIAGNOSIS — H9190 Unspecified hearing loss, unspecified ear: Secondary | ICD-10-CM | POA: Insufficient documentation

## 2011-07-25 DIAGNOSIS — Z85820 Personal history of malignant melanoma of skin: Secondary | ICD-10-CM | POA: Insufficient documentation

## 2011-07-25 DIAGNOSIS — C8330 Diffuse large B-cell lymphoma, unspecified site: Secondary | ICD-10-CM

## 2011-07-25 DIAGNOSIS — C8584 Other specified types of non-Hodgkin lymphoma, lymph nodes of axilla and upper limb: Secondary | ICD-10-CM | POA: Insufficient documentation

## 2011-07-25 LAB — COMPREHENSIVE METABOLIC PANEL
Albumin: 3.8 g/dL (ref 3.5–5.2)
BUN: 20 mg/dL (ref 6–23)
Calcium: 9.5 mg/dL (ref 8.4–10.5)
Chloride: 101 mEq/L (ref 96–112)
Creatinine, Ser: 1.01 mg/dL (ref 0.50–1.35)
GFR calc non Af Amer: 69 mL/min — ABNORMAL LOW (ref >90)
Total Bilirubin: 0.3 mg/dL (ref 0.3–1.2)

## 2011-07-25 LAB — DIFFERENTIAL
Basophils Relative: 1 % (ref 0–1)
Eosinophils Relative: 5 % (ref 0–5)
Monocytes Absolute: 0.5 10*3/uL (ref 0.1–1.0)
Monocytes Relative: 6 % (ref 3–12)
Neutro Abs: 5.7 10*3/uL (ref 1.7–7.7)

## 2011-07-25 LAB — CBC
HCT: 38.9 % — ABNORMAL LOW (ref 39.0–52.0)
Hemoglobin: 13.1 g/dL (ref 13.0–17.0)
MCH: 32.6 pg (ref 26.0–34.0)
MCHC: 33.7 g/dL (ref 30.0–36.0)
MCV: 96.8 fL (ref 78.0–100.0)

## 2011-07-25 LAB — LACTATE DEHYDROGENASE: LDH: 197 U/L (ref 94–250)

## 2011-07-25 MED ORDER — HEPARIN SOD (PORK) LOCK FLUSH 100 UNIT/ML IV SOLN
500.0000 [IU] | Freq: Once | INTRAVENOUS | Status: AC
  Administered 2011-07-25: 500 [IU] via INTRAVENOUS
  Filled 2011-07-25: qty 5

## 2011-07-25 MED ORDER — HEPARIN SOD (PORK) LOCK FLUSH 100 UNIT/ML IV SOLN
INTRAVENOUS | Status: AC
  Administered 2011-07-25: 500 [IU] via INTRAVENOUS
  Filled 2011-07-25: qty 5

## 2011-07-25 MED ORDER — SODIUM CHLORIDE 0.9 % IJ SOLN
10.0000 mL | Freq: Once | INTRAMUSCULAR | Status: AC
  Administered 2011-07-25: 10 mL via INTRAVENOUS
  Filled 2011-07-25: qty 10

## 2011-07-25 MED ORDER — SODIUM CHLORIDE 0.9 % IJ SOLN
INTRAMUSCULAR | Status: AC
  Administered 2011-07-25: 10 mL via INTRAVENOUS
  Filled 2011-07-25: qty 10

## 2011-07-25 NOTE — Patient Instructions (Signed)
[facility]  Discharge Instructions  RECOMMENDATIONS MADE BY THE CONSULTANT AND ANY TEST RESULTS WILL BE SENT TO YOUR REFERRING DOCTOR.   Port flush done today. We will call you if there are any abnormal lab results. Come every 6 weeks for your port flush. We will do lab work again in 6 months and have you see the doctor then as well.   I acknowledge that I have been informed and understand all the instructions given to me and received a copy. I do not have any more questions at this time, but understand that I may call the Specialty Clinic at [facility] at [phone] during business hours should I have any further questions or need assistance in obtaining follow-up care.    __________________________________________  _____________  __________ Signature of Patient or Authorized Representative            Date                   Time    __________________________________________ Nurse's Signature
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
## 2011-07-25 NOTE — Progress Notes (Signed)
This office note has been dictated.

## 2011-07-25 NOTE — Progress Notes (Signed)
Curtis Lang presented for Portacath access and flush. Proper placement of portacath confirmed by CXR. Portacath located right chest wall accessed with  H 20 needle. Good blood return present. Portacath flushed with 20ml NS and 500U/5ml Heparin and needle removed intact. Procedure without incident. Patient tolerated procedure well.   

## 2011-07-26 ENCOUNTER — Telehealth (HOSPITAL_COMMUNITY): Payer: Self-pay | Admitting: *Deleted

## 2011-07-26 NOTE — Telephone Encounter (Signed)
Message copied by Dennie Maizes on Wed Jul 26, 2011 10:30 AM ------      Message from: Mariel Sleet, ERIC S      Created: Tue Jul 25, 2011  5:35 PM       Labs great-call him!

## 2011-07-26 NOTE — Telephone Encounter (Signed)
Spoke with pt as below.

## 2011-08-02 ENCOUNTER — Other Ambulatory Visit: Payer: Self-pay | Admitting: *Deleted

## 2011-08-02 ENCOUNTER — Other Ambulatory Visit: Payer: Self-pay | Admitting: Cardiology

## 2011-08-02 MED ORDER — POTASSIUM CHLORIDE CRYS ER 10 MEQ PO TBCR: 10.0000 meq | EXTENDED_RELEASE_TABLET | Freq: Every day | ORAL | Status: DC

## 2011-08-13 ENCOUNTER — Emergency Department (HOSPITAL_COMMUNITY): Payer: Medicare Other

## 2011-08-13 ENCOUNTER — Emergency Department (HOSPITAL_COMMUNITY)
Admission: EM | Admit: 2011-08-13 | Discharge: 2011-08-13 | Disposition: A | Discharge status: Home / Self Care | Payer: Medicare Other | Attending: Emergency Medicine | Admitting: Emergency Medicine

## 2011-08-13 ENCOUNTER — Encounter (HOSPITAL_COMMUNITY): Payer: Self-pay | Admitting: *Deleted

## 2011-08-13 DIAGNOSIS — I10 Essential (primary) hypertension: Secondary | ICD-10-CM | POA: Insufficient documentation

## 2011-08-13 DIAGNOSIS — R110 Nausea: Secondary | ICD-10-CM | POA: Insufficient documentation

## 2011-08-13 DIAGNOSIS — I739 Peripheral vascular disease, unspecified: Secondary | ICD-10-CM | POA: Insufficient documentation

## 2011-08-13 DIAGNOSIS — H9319 Tinnitus, unspecified ear: Secondary | ICD-10-CM | POA: Insufficient documentation

## 2011-08-13 DIAGNOSIS — I2510 Atherosclerotic heart disease of native coronary artery without angina pectoris: Secondary | ICD-10-CM | POA: Insufficient documentation

## 2011-08-13 DIAGNOSIS — J4489 Other specified chronic obstructive pulmonary disease: Secondary | ICD-10-CM | POA: Insufficient documentation

## 2011-08-13 DIAGNOSIS — Z87898 Personal history of other specified conditions: Secondary | ICD-10-CM | POA: Insufficient documentation

## 2011-08-13 DIAGNOSIS — R5381 Other malaise: Secondary | ICD-10-CM | POA: Insufficient documentation

## 2011-08-13 DIAGNOSIS — E785 Hyperlipidemia, unspecified: Secondary | ICD-10-CM | POA: Insufficient documentation

## 2011-08-13 DIAGNOSIS — Z7982 Long term (current) use of aspirin: Secondary | ICD-10-CM | POA: Insufficient documentation

## 2011-08-13 DIAGNOSIS — Z79899 Other long term (current) drug therapy: Secondary | ICD-10-CM | POA: Insufficient documentation

## 2011-08-13 DIAGNOSIS — R531 Weakness: Secondary | ICD-10-CM

## 2011-08-13 DIAGNOSIS — E7800 Pure hypercholesterolemia, unspecified: Secondary | ICD-10-CM | POA: Insufficient documentation

## 2011-08-13 DIAGNOSIS — J449 Chronic obstructive pulmonary disease, unspecified: Secondary | ICD-10-CM | POA: Insufficient documentation

## 2011-08-13 DIAGNOSIS — Z8673 Personal history of transient ischemic attack (TIA), and cerebral infarction without residual deficits: Secondary | ICD-10-CM | POA: Insufficient documentation

## 2011-08-13 LAB — BASIC METABOLIC PANEL
Chloride: 103 mEq/L (ref 96–112)
GFR calc Af Amer: >90 mL/min (ref >90)
Potassium: 4.1 mEq/L (ref 3.5–5.1)

## 2011-08-13 LAB — DIFFERENTIAL
Basophils Relative: 1 % (ref 0–1)
Eosinophils Absolute: 0.3 10*3/uL (ref 0.0–0.7)
Lymphocytes Relative: 13 % (ref 12–46)
Neutro Abs: 5.8 10*3/uL (ref 1.7–7.7)
Neutrophils Relative %: 76 % (ref 43–77)

## 2011-08-13 LAB — URINALYSIS, ROUTINE W REFLEX MICROSCOPIC
Bilirubin Urine: NEGATIVE
Glucose, UA: NEGATIVE mg/dL
Hgb urine dipstick: NEGATIVE
Protein, ur: NEGATIVE mg/dL
Urobilinogen, UA: 0.2 mg/dL (ref 0.0–1.0)

## 2011-08-13 LAB — CBC
Platelets: 235 10*3/uL (ref 150–400)
RDW: 14.6 % (ref 11.5–15.5)
WBC: 7.6 10*3/uL (ref 4.0–10.5)

## 2011-08-13 LAB — GLUCOSE, CAPILLARY: Glucose-Capillary: 126 mg/dL — ABNORMAL HIGH (ref 70–99)

## 2011-08-13 MED ORDER — CIPROFLOXACIN IN D5W 200 MG/100ML IV SOLN: 200.0000 mg | Freq: Once | INTRAVENOUS | Status: DC

## 2011-08-13 MED ORDER — SODIUM CHLORIDE 0.9 % IV BOLUS (SEPSIS)
500.0000 mL | Freq: Once | INTRAVENOUS | Status: AC
  Administered 2011-08-13: 500 mL via INTRAVENOUS

## 2011-08-13 MED ORDER — MORPHINE SULFATE 2 MG/ML IJ SOLN: 2.0000 mg | Freq: Once | INTRAMUSCULAR | Status: DC

## 2011-08-13 NOTE — ED Provider Notes (Signed)
History   This chart was scribed for [employer]. Colon Branch, MD by Clarita Crane. The patient was seen in room APA08/APA08. Patient's care was started at 1018.   CSN: [id number]  Arrival date & time 08/13/11  1018   First MD Initiated Contact with Patient 08/13/11 1105      Chief Complaint  Patient presents with  . Weakness    (Consider location/radiation/quality/duration/timing/severity/associated sxs/prior treatment) HPI Curtis Lang is a 78 y.o. male who presents to the Emergency Department complaining of constant moderate generalized weakness onset this morning after eating breakfast and persistent since with associated nausea and tinnitus. Denies SOB, cough, fever, vomiting, decreased appetite, diarrhea, constipation. Patient with h/o HTN, HLD, lymphoma, ASCVD, COPD, CA.  PCP- Phillips Odor  Past Medical History  Diagnosis Date  . Arteriosclerotic cardiovascular disease (ASCVD)     BMS to RCA 1997; cutting ballon for RCA restenosis in 2001; cath 12/10 - 100% RCA L->R collaterals; nl EF; 40% LAD and OM1  . Hypertension   . Hyperlipidemia   . Cerebrovascular disease     bilateral bruits; duplex in 2009 - mild plaque w/o stenosis  . Peripheral vascular disease     ABI on 0.87 on left  . Lymphoma     chemotherapy and radiation therapy in 2006; recurrence in 2007  . Squamous cell carcinoma     s/p Mohs surgery  . Tobacco abuse, in remission     60 pack years; discontinued in 2003  . COPD (chronic obstructive pulmonary disease)   . Fasting hyperglycemia   . DDD (degenerative disc disease), cervical   . Muscle discomfort     No improvement after statins temporary discontinued  . Cancer     b cell lymphoma  . 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 Hypercholesteremia     Past Surgical History  Procedure Date  . Anterior fusion cervical spine 2003    posterior?  . Inguinal hernia repair 1996    Right  . Orif femur fracture 1996  . Mohs surgery   . Colonoscopy 05/16/2011   Procedure: COLONOSCOPY;  Surgeon: Dalia Heading;  Location: [facility];  Service: Gastroenterology;  Laterality: N/A;    Family History  Problem Relation Age of Onset  . Stroke Father     deceased - 12  . Stroke Mother     deceased - 65   . Heart attack Brother 56  . Heart attack Sister   . Colon cancer Neg Hx     History  Substance Use Topics  . Smoking status: Former Smoker -- 1.0 packs/day for 30 years  . Smokeless tobacco: Not on file  . Alcohol Use: No      Review of Systems 10 Systems reviewed and are negative for acute change except as noted in the HPI.  Allergies  Review of patient's allergies indicates no known allergies.  Home Medications   Current Outpatient Rx  Name Route Sig Dispense Refill  . ASPIRIN 81 MG PO TBEC Oral Take 162 mg by mouth daily.     . ATORVASTATIN CALCIUM 40 MG PO TABS Oral Take 1 tablet (40 mg total) by mouth daily. 30 tablet 6    D/C SIMVASTATIN  . FUROSEMIDE 40 MG PO TABS Oral Take 1 tablet (40 mg total) by mouth daily. 30 tablet 6  . LISINOPRIL 20 MG PO TABS Oral Take 20 mg by mouth daily.      Marland Kitchen NAPROXEN SODIUM 220 MG PO TABS Oral Take 220 mg  by mouth daily as needed. Pain/ OTC    . POTASSIUM CHLORIDE CRYS ER 10 MEQ PO TBCR Oral Take 1 tablet (10 mEq total) by mouth daily. 30 tablet 6  . PROMETHAZINE HCL 25 MG PO TABS Oral Take 25 mg by mouth Every 6 hours as needed. Nausea/vomiting    . AMLODIPINE BESYLATE 5 MG PO TABS Oral Take 1 tablet (5 mg total) by mouth daily. 30 tablet 6  . IBUPROFEN 200 MG PO TABS Oral Take 200 mg by mouth every 6 (six) hours as needed. Aches and pain      BP 144/66  Pulse 56  Temp(Src) 97.6 F (36.4 C) (Oral)  Resp 16  Ht 5\' 6"  (1.676 m)  Wt 170 lb (77.111 kg)  BMI 27.44 kg/m2  SpO2 98%  Physical Exam  Nursing note and vitals reviewed. Constitutional: He is oriented to person, place, and time. He appears well-developed and well-nourished. No distress.  HENT:  Head: Normocephalic and  atraumatic.  Mouth/Throat: Oropharynx is clear and moist.       TMs normal bilaterally.   Eyes: EOM are normal. Pupils are equal, round, and reactive to light.  Neck: Normal range of motion. Neck supple. No tracheal deviation present.       No carotid bruits.   Cardiovascular: Normal rate and regular rhythm.  Exam reveals no gallop and no friction rub.   No murmur heard. Pulmonary/Chest: Effort normal. No respiratory distress. He has no wheezes. He has no rales.  Abdominal: Soft. Bowel sounds are normal. He exhibits no distension. There is no tenderness.  Musculoskeletal: Normal range of motion. He exhibits no edema.       Port-a-cath to right chest.   Neurological: He is alert and oriented to person, place, and time. No sensory deficit.  Skin: Skin is warm and dry.  Psychiatric: He has a normal mood and affect. His behavior is normal.    ED Course  Procedures (including critical care time)  DIAGNOSTIC STUDIES: Oxygen Saturation is 99% on room air, normal by my interpretation.    COORDINATION OF CARE: 11:23AM- Patient informed of current plan for treatment and evaluation and agrees with plan at this time.  2:03PM- Patient informed of lab and imaging results. Patient informed of intent to d/c home and need for follow up. Patient agrees with plan set forth.   Results for orders placed during the hospital encounter of 08/13/11  CBC      Component Value Range   WBC 7.6  4.0 - 10.5 (K/uL)   RBC 4.12 (*) 4.22 - 5.81 (MIL/uL)   Hemoglobin 13.1  13.0 - 17.0 (g/dL)   HCT 16.1  09.6 - 04.5 (%)   MCV 94.7  78.0 - 100.0 (fL)   MCH 31.8  26.0 - 34.0 (pg)   MCHC 33.6  30.0 - 36.0 (g/dL)   RDW 40.9  81.1 - 91.4 (%)   Platelets 235  150 - 400 (K/uL)  BASIC METABOLIC PANEL      Component Value Range   Sodium 138  135 - 145 (mEq/L)   Potassium 4.1  3.5 - 5.1 (mEq/L)   Chloride 103  96 - 112 (mEq/L)   CO2 25  19 - 32 (mEq/L)   Glucose, Bld 131 (*) 70 - 99 (mg/dL)   BUN 20  6 - 23 (mg/dL)     Creatinine, Ser 7.82  0.50 - 1.35 (mg/dL)   Calcium 9.4  8.4 - 95.6 (mg/dL)   GFR calc non Af Denyse Dago  80 (*) >90 (mL/min)   GFR calc Af Amer >90  >90 (mL/min)  GLUCOSE, CAPILLARY      Component Value Range   Glucose-Capillary 126 (*) 70 - 99 (mg/dL)  DIFFERENTIAL      Component Value Range   Neutrophils Relative 76  43 - 77 (%)   Neutro Abs 5.8  1.7 - 7.7 (K/uL)   Lymphocytes Relative 13  12 - 46 (%)   Lymphs Abs 1.0  0.7 - 4.0 (K/uL)   Monocytes Relative 7  3 - 12 (%)   Monocytes Absolute 0.6  0.1 - 1.0 (K/uL)   Eosinophils Relative 4  0 - 5 (%)   Eosinophils Absolute 0.3  0.0 - 0.7 (K/uL)   Basophils Relative 1  0 - 1 (%)   Basophils Absolute 0.0  0.0 - 0.1 (K/uL)  URINALYSIS, ROUTINE W REFLEX MICROSCOPIC      Component Value Range   Color, Urine STRAW (*) YELLOW    APPearance CLEAR  CLEAR    Specific Gravity, Urine 1.010  1.005 - 1.030    pH 6.0  5.0 - 8.0    Glucose, UA NEGATIVE  NEGATIVE (mg/dL)   Hgb urine dipstick NEGATIVE  NEGATIVE    Bilirubin Urine NEGATIVE  NEGATIVE    Ketones, ur NEGATIVE  NEGATIVE (mg/dL)   Protein, ur NEGATIVE  NEGATIVE (mg/dL)   Urobilinogen, UA 0.2  0.0 - 1.0 (mg/dL)   Nitrite NEGATIVE  NEGATIVE    Leukocytes, UA NEGATIVE  NEGATIVE     Dg Chest Port 1 View  08/13/2011  *RADIOLOGY REPORT*  Clinical Data: Weakness, shortness of breath  PORTABLE CHEST - 1 VIEW  Comparison: 01/08/2011  Findings: Stable right subclavian port catheter.  Clips in bilateral axilla.  Cervical fixation hardware partially seen. Heart size upper limits normal.  No definite effusion.  Low lung volumes with crowding of perihilar and bibasilar bronchovascular structures.  No confluent infiltrate or overt edema.  Atheromatous aortic arch.  IMPRESSION:  1.  Low volumes with no definite acute disease. 2.  Postop changes as above.  Original Report Authenticated By: Osa Craver, M.D.     Date: 08/13/2011  1048  Rate: 53  Rhythm: sinus bradycardia  QRS Axis: left   Intervals: normal  ST/T Wave abnormalities: normal  Conduction Disutrbances:none  Narrative Interpretation: anteroseptal infarct, age undetermined  Old EKG Reviewed: none available  MDM  Patient with generalized weakness x 3 days. Labs are unremarkable. Chest xray, EKG normal.Reviewed results with patient. .Pt stable in ED with no significant deterioration in condition.The patient appears reasonably screened and/or stabilized for discharge and I doubt any other medical condition or other [facility] requiring further screening, evaluation, or treatment in the ED at this time prior to discharge.   I personally performed the services described in this documentation, which was scribed in my presence. The recorded information has been reviewed and considered.  MDM Reviewed: nursing note and vitals Interpretation: labs, ECG and x-ray      Nicoletta Dress. Colon Branch, MD 08/13/11 1429

## 2011-08-13 NOTE — ED Notes (Signed)
Pt given coke with ice.  Sipping and tolerating well.  Unable to void at this time.  Given urinal and told to inform when sample is received.  Verbalized understanding.

## 2011-08-13 NOTE — ED Notes (Signed)
Pt c/o feeling "weak and just not right" since eating breakfast this am. Also c/o ringing in his ears. Denies pain, nausea or vomiting.

## 2011-08-29 ENCOUNTER — Encounter (HOSPITAL_COMMUNITY): Payer: Medicare Other | Attending: Oncology

## 2011-08-29 DIAGNOSIS — C8589 Other specified types of non-Hodgkin lymphoma, extranodal and solid organ sites: Secondary | ICD-10-CM | POA: Insufficient documentation

## 2011-08-29 DIAGNOSIS — C8581 Other specified types of non-Hodgkin lymphoma, lymph nodes of head, face, and neck: Secondary | ICD-10-CM

## 2011-08-29 DIAGNOSIS — C8330 Diffuse large B-cell lymphoma, unspecified site: Secondary | ICD-10-CM

## 2011-08-29 MED ORDER — SODIUM CHLORIDE 0.9 % IJ SOLN
10.0000 mL | INTRAMUSCULAR | Status: DC | PRN
  Administered 2011-08-29: 10 mL via INTRAVENOUS
  Filled 2011-08-29: qty 10

## 2011-08-29 MED ORDER — HEPARIN SOD (PORK) LOCK FLUSH 100 UNIT/ML IV SOLN
INTRAVENOUS | Status: AC
  Administered 2011-08-29: 500 [IU] via INTRAVENOUS
  Filled 2011-08-29: qty 5

## 2011-08-29 MED ORDER — SODIUM CHLORIDE 0.9 % IJ SOLN
INTRAMUSCULAR | Status: AC
  Administered 2011-08-29: 10 mL via INTRAVENOUS
  Filled 2011-08-29: qty 10

## 2011-08-29 MED ORDER — HEPARIN SOD (PORK) LOCK FLUSH 100 UNIT/ML IV SOLN
500.0000 [IU] | Freq: Once | INTRAVENOUS | Status: AC
  Administered 2011-08-29: 500 [IU] via INTRAVENOUS
  Filled 2011-08-29: qty 5

## 2011-08-29 NOTE — Progress Notes (Signed)
Curtis Lang presented for Portacath access and flush. Proper placement of portacath confirmed by CXR. Portacath located RIGHT chest wall accessed with  H 20 needle. Good blood return present. Portacath flushed with 20ml NS and 500U/51ml Heparin and needle removed intact. Procedure without incident. Patient tolerated procedure well.

## 2011-10-10 ENCOUNTER — Encounter (HOSPITAL_COMMUNITY): Payer: Medicare Other

## 2011-10-11 ENCOUNTER — Encounter (HOSPITAL_COMMUNITY): Payer: Medicare Other | Attending: Oncology

## 2011-10-11 DIAGNOSIS — C8589 Other specified types of non-Hodgkin lymphoma, extranodal and solid organ sites: Secondary | ICD-10-CM | POA: Insufficient documentation

## 2011-10-11 DIAGNOSIS — C8581 Other specified types of non-Hodgkin lymphoma, lymph nodes of head, face, and neck: Secondary | ICD-10-CM

## 2011-10-11 DIAGNOSIS — Z452 Encounter for adjustment and management of vascular access device: Secondary | ICD-10-CM

## 2011-10-11 MED ORDER — HEPARIN SOD (PORK) LOCK FLUSH 100 UNIT/ML IV SOLN
500.0000 [IU] | Freq: Once | INTRAVENOUS | Status: AC
  Administered 2011-10-11: 500 [IU] via INTRAVENOUS
  Filled 2011-10-11: qty 5

## 2011-10-11 MED ORDER — SODIUM CHLORIDE 0.9 % IJ SOLN
INTRAMUSCULAR | Status: AC
  Filled 2011-10-11: qty 10

## 2011-10-11 MED ORDER — HEPARIN SOD (PORK) LOCK FLUSH 100 UNIT/ML IV SOLN
INTRAVENOUS | Status: AC
  Filled 2011-10-11: qty 5

## 2011-10-11 MED ORDER — SODIUM CHLORIDE 0.9 % IJ SOLN
10.0000 mL | INTRAMUSCULAR | Status: DC | PRN
  Administered 2011-10-11: 10 mL via INTRAVENOUS
  Filled 2011-10-11: qty 10

## 2011-10-11 NOTE — Progress Notes (Signed)
Curtis Lang presented for Portacath access and flush. Proper placement of portacath confirmed by CXR. Portacath located right chest wall accessed with  H 20 needle. Good blood return present. Portacath flushed with 20ml NS and 500U/5ml Heparin and needle removed intact. Procedure without incident. Patient tolerated procedure well.   

## 2011-11-21 ENCOUNTER — Encounter (HOSPITAL_COMMUNITY): Payer: Medicare Other

## 2011-11-22 ENCOUNTER — Encounter (HOSPITAL_COMMUNITY): Payer: Medicare Other | Attending: Oncology

## 2011-11-22 DIAGNOSIS — Z452 Encounter for adjustment and management of vascular access device: Secondary | ICD-10-CM

## 2011-11-22 DIAGNOSIS — C8581 Other specified types of non-Hodgkin lymphoma, lymph nodes of head, face, and neck: Secondary | ICD-10-CM

## 2011-11-22 DIAGNOSIS — C8589 Other specified types of non-Hodgkin lymphoma, extranodal and solid organ sites: Secondary | ICD-10-CM | POA: Insufficient documentation

## 2011-11-22 DIAGNOSIS — C8590 Non-Hodgkin lymphoma, unspecified, unspecified site: Secondary | ICD-10-CM

## 2011-11-22 MED ORDER — HEPARIN SOD (PORK) LOCK FLUSH 100 UNIT/ML IV SOLN
INTRAVENOUS | Status: AC
  Filled 2011-11-22: qty 5

## 2011-11-22 MED ORDER — SODIUM CHLORIDE 0.9 % IJ SOLN
10.0000 mL | INTRAMUSCULAR | Status: DC | PRN
  Administered 2011-11-22: 10 mL via INTRAVENOUS
  Filled 2011-11-22: qty 10

## 2011-11-22 MED ORDER — HEPARIN SOD (PORK) LOCK FLUSH 100 UNIT/ML IV SOLN
500.0000 [IU] | Freq: Once | INTRAVENOUS | Status: AC
  Administered 2011-11-22: 500 [IU] via INTRAVENOUS
  Filled 2011-11-22: qty 5

## 2011-11-22 NOTE — Progress Notes (Signed)
Curtis Lang presented for Portacath access and flush. Proper placement of portacath confirmed by CXR. Portacath located rt chest wall accessed with  H 20 needle. Good blood return present. Portacath flushed with 20ml NS and 500U/57ml Heparin and needle removed intact. Procedure without incident. Patient tolerated procedure well.

## 2012-01-02 ENCOUNTER — Other Ambulatory Visit (HOSPITAL_COMMUNITY): Payer: Self-pay | Admitting: Family Medicine

## 2012-01-02 ENCOUNTER — Ambulatory Visit (HOSPITAL_COMMUNITY)
Admission: RE | Admit: 2012-01-02 | Discharge: 2012-01-02 | Disposition: A | Discharge status: Home / Self Care | Payer: Medicare Other | Source: Ambulatory Visit | Attending: Family Medicine | Admitting: Family Medicine

## 2012-01-02 DIAGNOSIS — M542 Cervicalgia: Secondary | ICD-10-CM

## 2012-01-02 DIAGNOSIS — M5030 Other cervical disc degeneration, unspecified cervical region: Secondary | ICD-10-CM

## 2012-01-02 DIAGNOSIS — Z981 Arthrodesis status: Secondary | ICD-10-CM | POA: Insufficient documentation

## 2012-01-03 ENCOUNTER — Encounter (HOSPITAL_COMMUNITY): Payer: Medicare Other | Attending: Oncology

## 2012-01-03 DIAGNOSIS — C8581 Other specified types of non-Hodgkin lymphoma, lymph nodes of head, face, and neck: Secondary | ICD-10-CM

## 2012-01-03 DIAGNOSIS — C8330 Diffuse large B-cell lymphoma, unspecified site: Secondary | ICD-10-CM

## 2012-01-03 DIAGNOSIS — Z452 Encounter for adjustment and management of vascular access device: Secondary | ICD-10-CM

## 2012-01-03 DIAGNOSIS — C8589 Other specified types of non-Hodgkin lymphoma, extranodal and solid organ sites: Secondary | ICD-10-CM | POA: Insufficient documentation

## 2012-01-03 MED ORDER — SODIUM CHLORIDE 0.9 % IJ SOLN
INTRAMUSCULAR | Status: AC
  Filled 2012-01-03: qty 10

## 2012-01-03 MED ORDER — SODIUM CHLORIDE 0.9 % IJ SOLN
10.0000 mL | INTRAMUSCULAR | Status: DC | PRN
  Administered 2012-01-03: 10 mL via INTRAVENOUS
  Filled 2012-01-03: qty 10

## 2012-01-03 MED ORDER — HEPARIN SOD (PORK) LOCK FLUSH 100 UNIT/ML IV SOLN
INTRAVENOUS | Status: AC
  Filled 2012-01-03: qty 5

## 2012-01-03 MED ORDER — HEPARIN SOD (PORK) LOCK FLUSH 100 UNIT/ML IV SOLN
500.0000 [IU] | Freq: Once | INTRAVENOUS | Status: AC
  Administered 2012-01-03: 500 [IU] via INTRAVENOUS
  Filled 2012-01-03: qty 5

## 2012-01-03 NOTE — Progress Notes (Signed)
Tolerated port flush well. 

## 2012-01-05 ENCOUNTER — Ambulatory Visit (HOSPITAL_COMMUNITY): Admission: RE | Admit: 2012-01-05 | Payer: Medicare Other | Source: Ambulatory Visit

## 2012-01-22 ENCOUNTER — Encounter (HOSPITAL_COMMUNITY): Payer: Self-pay | Admitting: Oncology

## 2012-01-22 ENCOUNTER — Ambulatory Visit (HOSPITAL_COMMUNITY): Payer: Medicare Other | Admitting: Oncology

## 2012-01-25 ENCOUNTER — Telehealth: Payer: Self-pay | Admitting: Cardiology

## 2012-01-25 MED ORDER — ATORVASTATIN CALCIUM 40 MG PO TABS: 40.0000 mg | ORAL_TABLET | Freq: Every day | ORAL | Status: DC

## 2012-01-25 NOTE — Telephone Encounter (Signed)
PT NEEDS ATORVASTATIN CALLED IN TO [facility] ASAP. THEY STATE THEY GAVE HIM A MONTH SUPPLY LAST MONTH BECAUSE THEY NEVER HEARD BACK FROM [place].  PT IS DUE FOR F/U THIS MONTH.

## 2012-01-25 NOTE — Telephone Encounter (Signed)
Sent via e script for 11 refills

## 2012-01-29 ENCOUNTER — Encounter: Payer: Self-pay | Admitting: Cardiology

## 2012-01-29 ENCOUNTER — Ambulatory Visit (INDEPENDENT_AMBULATORY_CARE_PROVIDER_SITE_OTHER): Payer: Medicare Other | Admitting: Cardiology

## 2012-01-29 VITALS — BP 149/79 | HR 55 | Ht 67.0 in | Wt 171.0 lb

## 2012-01-29 DIAGNOSIS — I2510 Atherosclerotic heart disease of native coronary artery without angina pectoris: Secondary | ICD-10-CM

## 2012-01-29 DIAGNOSIS — J449 Chronic obstructive pulmonary disease, unspecified: Secondary | ICD-10-CM

## 2012-01-29 DIAGNOSIS — C8589 Other specified types of non-Hodgkin lymphoma, extranodal and solid organ sites: Secondary | ICD-10-CM

## 2012-01-29 DIAGNOSIS — D649 Anemia, unspecified: Secondary | ICD-10-CM

## 2012-01-29 DIAGNOSIS — C8330 Diffuse large B-cell lymphoma, unspecified site: Secondary | ICD-10-CM

## 2012-01-29 DIAGNOSIS — I7090 Unspecified atherosclerosis: Secondary | ICD-10-CM

## 2012-01-29 DIAGNOSIS — I10 Essential (primary) hypertension: Secondary | ICD-10-CM

## 2012-01-29 MED ORDER — LISINOPRIL 20 MG PO TABS: 20.0000 mg | ORAL_TABLET | Freq: Every day | ORAL | Status: DC

## 2012-01-29 NOTE — Assessment & Plan Note (Signed)
It appears likely that lymphoma has been cured, as he is 6 years out from the end of therapy with no evidence for recurrence.

## 2012-01-29 NOTE — Progress Notes (Deleted)
Name: Curtis Lang    DOB: 06-Apr-1931  Age: 78 y.o.  MR#: [id number]       PCP:  Colette Ribas, MD      Insurance: @PAYORNAME @   CC:    Chief Complaint  Patient presents with  . Hypertension    No complaints/ No list or bottles, states no changes in meds/TC    VS BP 149/79  Pulse 55  Ht 5\' 7"  (1.702 m)  Wt 171 lb (77.565 kg)  BMI 26.78 kg/m2  Weights Current Weight  01/29/12 171 lb (77.565 kg)  08/13/11 170 lb (77.111 kg)  07/25/11 172 lb 12.8 oz (78.382 kg)    Blood Pressure  BP Readings from Last 3 Encounters:  01/29/12 149/79  08/13/11 156/74  07/25/11 136/70     Admit date:  (Not on file) Last encounter with RMR:  01/25/2012   Allergy No Known Allergies  Current Outpatient Prescriptions  Medication Sig Dispense Refill  . aspirin 81 MG EC tablet Take 162 mg by mouth daily.       Marland Kitchen atorvastatin (LIPITOR) 40 MG tablet Take 1 tablet (40 mg total) by mouth daily.  30 tablet  11  . furosemide (LASIX) 40 MG tablet Take 1 tablet (40 mg total) by mouth daily.  30 tablet  6  . ibuprofen (ADVIL,MOTRIN) 200 MG tablet Take 200 mg by mouth every 6 (six) hours as needed. Aches and pain      . lisinopril (PRINIVIL,ZESTRIL) 20 MG tablet Take 20 mg by mouth daily.        . naproxen sodium (ANAPROX) 220 MG tablet Take 220 mg by mouth daily as needed. Pain/ OTC      . potassium chloride (K-DUR,KLOR-CON) 10 MEQ tablet Take 1 tablet (10 mEq total) by mouth daily.  30 tablet  6  . promethazine (PHENERGAN) 25 MG tablet Take 25 mg by mouth Every 6 hours as needed. Nausea/vomiting      . traMADol-acetaminophen (ULTRACET) 37.5-325 MG per tablet       . amLODipine (NORVASC) 5 MG tablet Take 1 tablet (5 mg total) by mouth daily.  30 tablet  6    Discontinued Meds:   There are no discontinued medications.  Patient Active Problem List  Diagnosis  . Diffuse large B cell lymphoma  . Tobacco abuse, in remission  . HYPERTENSION  . CEREBROVASCULAR DISEASE  . COPD  . DISC DISEASE,  CERVICAL  . Arteriosclerotic cardiovascular disease (ASCVD)  . Peripheral vascular disease  . Anemia    LABS No visits with results within 3 Month(s) from this visit. Latest known visit with results is:  Admission on 08/13/2011, Discharged on 08/13/2011  Component Date Value  . WBC 08/13/2011 7.6   . RBC 08/13/2011 4.12*  . Hemoglobin 08/13/2011 13.1   . HCT 08/13/2011 39.0   . MCV 08/13/2011 94.7   . [facility] 08/13/2011 31.8   . MCHC 08/13/2011 33.6   . RDW 08/13/2011 14.6   . Platelets 08/13/2011 235   . Sodium 08/13/2011 138   . Potassium 08/13/2011 4.1   . Chloride 08/13/2011 103   . CO2 08/13/2011 25   . Glucose, Bld 08/13/2011 131*  . BUN 08/13/2011 20   . Creatinine, Ser 08/13/2011 0.88   . Calcium 08/13/2011 9.4   . GFR calc non Af Amer 08/13/2011 80*  . GFR calc Af Amer 08/13/2011 >90   . Glucose-Capillary 08/13/2011 126*  . Neutrophils Relative 08/13/2011 76   . Neutro Abs 08/13/2011 5.8   .  Lymphocytes Relative 08/13/2011 13   . Lymphs Abs 08/13/2011 1.0   . Monocytes Relative 08/13/2011 7   . Monocytes Absolute 08/13/2011 0.6   . Eosinophils Relative 08/13/2011 4   . Eosinophils Absolute 08/13/2011 0.3   . Basophils Relative 08/13/2011 1   . Basophils Absolute 08/13/2011 0.0   . Color, Urine 08/13/2011 STRAW*  . APPearance 08/13/2011 CLEAR   . Specific Gravity, Urine 08/13/2011 1.010   . pH 08/13/2011 6.0   . Glucose, UA 08/13/2011 NEGATIVE   . Hgb urine dipstick 08/13/2011 NEGATIVE   . Bilirubin Urine 08/13/2011 NEGATIVE   . Ketones, ur 08/13/2011 NEGATIVE   . Protein, ur 08/13/2011 NEGATIVE   . Urobilinogen, UA 08/13/2011 0.2   . Nitrite 08/13/2011 NEGATIVE   . Leukocytes, UA 08/13/2011 NEGATIVE      Results for this Opt Visit:     Results for orders placed during the hospital encounter of 08/13/11  CBC      Component Value Range   WBC 7.6  4.0 - 10.5 K/uL   RBC 4.12 (*) 4.22 - 5.81 MIL/uL   Hemoglobin 13.1  13.0 - 17.0 g/dL   HCT 46.9  62.9 -  52.8 %   MCV 94.7  78.0 - 100.0 fL   MCH 31.8  26.0 - 34.0 pg   MCHC 33.6  30.0 - 36.0 g/dL   RDW 41.3  24.4 - 01.0 %   Platelets 235  150 - 400 K/uL  BASIC METABOLIC PANEL      Component Value Range   Sodium 138  135 - 145 mEq/L   Potassium 4.1  3.5 - 5.1 mEq/L   Chloride 103  96 - 112 mEq/L   CO2 25  19 - 32 mEq/L   Glucose, Bld 131 (*) 70 - 99 mg/dL   BUN 20  6 - 23 mg/dL   Creatinine, Ser 2.72  0.50 - 1.35 mg/dL   Calcium 9.4  8.4 - 53.6 mg/dL   GFR calc non Af Amer 80 (*) >90 mL/min   GFR calc Af Amer >90  >90 mL/min  GLUCOSE, CAPILLARY      Component Value Range   Glucose-Capillary 126 (*) 70 - 99 mg/dL  DIFFERENTIAL      Component Value Range   Neutrophils Relative 76  43 - 77 %   Neutro Abs 5.8  1.7 - 7.7 K/uL   Lymphocytes Relative 13  12 - 46 %   Lymphs Abs 1.0  0.7 - 4.0 K/uL   Monocytes Relative 7  3 - 12 %   Monocytes Absolute 0.6  0.1 - 1.0 K/uL   Eosinophils Relative 4  0 - 5 %   Eosinophils Absolute 0.3  0.0 - 0.7 K/uL   Basophils Relative 1  0 - 1 %   Basophils Absolute 0.0  0.0 - 0.1 K/uL  URINALYSIS, ROUTINE W REFLEX MICROSCOPIC      Component Value Range   Color, Urine STRAW (*) YELLOW   APPearance CLEAR  CLEAR   Specific Gravity, Urine 1.010  1.005 - 1.030   pH 6.0  5.0 - 8.0   Glucose, UA NEGATIVE  NEGATIVE mg/dL   Hgb urine dipstick NEGATIVE  NEGATIVE   Bilirubin Urine NEGATIVE  NEGATIVE   Ketones, ur NEGATIVE  NEGATIVE mg/dL   Protein, ur NEGATIVE  NEGATIVE mg/dL   Urobilinogen, UA 0.2  0.0 - 1.0 mg/dL   Nitrite NEGATIVE  NEGATIVE   Leukocytes, UA NEGATIVE  NEGATIVE    EKG Orders  placed during the hospital encounter of 08/13/11  . ED EKG  . ED EKG  . EKG     Prior Assessment and Plan Problem List as of 01/29/2012            Cardiology Problems   HYPERTENSION   Last Assessment & Plan Note   01/11/2011 Office Visit Signed 01/11/2011  1:23 PM by Kathlen Brunswick, MD    Blood pressure control is good; current medications will be  continued.    CEREBROVASCULAR DISEASE   Last Assessment & Plan Note   01/11/2011 Office Visit Signed 01/11/2011  1:23 PM by Kathlen Brunswick, MD    Patient has not had neurologic symptoms and has had multiple negative carotid examinations over the course of 15 years.  I would be inclined to reserve additional studies for the development of symptoms.    Arteriosclerotic cardiovascular disease (ASCVD)   Last Assessment & Plan Note   01/11/2011 Office Visit Signed 01/11/2011  1:22 PM by Kathlen Brunswick, MD    Chest discomfort was atypical and likely musculoskeletal in origin.  Our focus will continue to be on optimal control of cardiovascular risk factors.    Peripheral vascular disease     Other   Diffuse large B cell lymphoma   Tobacco abuse, in remission   COPD   DISC DISEASE, CERVICAL   Anemia   Last Assessment & Plan Note   01/11/2011 Office Visit Signed 01/11/2011  1:26 PM by Kathlen Brunswick, MD    Patient has had a minimal chronic anemia in the past with hemoglobin in the 12-13 range and hematocrits of 36-38.  Most recent CBC was slightly worse with hemoglobin of 12 and hematocrit of 35.  He is scheduled for reevaluation by Dr. Mariel Sleet within the next week for followup of lymphoma.  Anemia can be addressed at that time.        Imaging: Dg Cervical Spine 2-3 Views  01/02/2012  *RADIOLOGY REPORT*  Clinical Data: Bilateral neck pain  CERVICAL SPINE - 2-3 VIEW  Comparison: None.  Findings: ACDF C3-C6.  Anterior plate and screws in good position. Fusion appears solid at C3-C6.  Incomplete segmentation of C2 and C3.  1.5 mm anterior slip C6-7 with spondylosis and facet degeneration.  Negative for fracture or mass.  Carotid artery calcification is present.  IMPRESSION: Solid fusion C3-C6.  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  Degenerative changes C6-7 with mild anterior slip.  Original Report Authenticated By: Camelia Phenes, M.D.     [facility] Calculation: Score not calculated. Missing: Total  Cholesterol

## 2012-01-29 NOTE — Progress Notes (Signed)
Patient ID: Curtis Lang, male   DOB: 10-07-1930, 78 y.o.   MRN: [id number]  HPI: Scheduled return visit for this very nice gentleman with a history of coronary disease and multiple cardiovascular risk factors.  Since his last visit, he has done generally well with good exercise tolerance and no need for hospitalization or urgent medical care.  He continues to refrain from cigarette smoking, having discontinued tobacco use some years ago.  He remains free of an apparent recurrence of lymphoma, now 6 years following treatment.  He is experiencing no cardiopulmonary symptoms.  There is a history of myalgias related to statin therapy.  Prior to Admission medications   Medication Sig Start Date End Date Taking? Authorizing Provider  aspirin 81 MG EC tablet Take 162 mg by mouth daily.    Yes Historical Provider, MD  atorvastatin (LIPITOR) 40 MG tablet Take 1 tablet (40 mg total) by mouth daily. 01/25/12  Yes Kathlen Brunswick, MD  furosemide (LASIX) 40 MG tablet Take 1 tablet (40 mg total) by mouth daily. 06/09/11  Yes Kathlen Brunswick, MD  ibuprofen (ADVIL,MOTRIN) 200 MG tablet Take 200 mg by mouth every 6 (six) hours as needed. Aches and pain   Yes Historical Provider, MD  lisinopril (PRINIVIL,ZESTRIL) 20 MG tablet Take 1 tablet (20 mg total) by mouth daily. 01/29/12  Yes Kathlen Brunswick, MD  naproxen sodium (ANAPROX) 220 MG tablet Take 220 mg by mouth daily as needed. Pain/ OTC   Yes Historical Provider, MD  potassium chloride (K-DUR,KLOR-CON) 10 MEQ tablet Take 1 tablet (10 mEq total) by mouth daily. 08/02/11  Yes Kathlen Brunswick, MD  promethazine (PHENERGAN) 25 MG tablet Take 25 mg by mouth Every 6 hours as needed. Nausea/vomiting 06/29/11  Yes Historical Provider, MD  traMADol-acetaminophen (ULTRACET) 37.5-325 MG per tablet  01/02/12  Yes Historical Provider, MD  amLODipine (NORVASC) 5 MG tablet Take 1 tablet (5 mg total) by mouth daily. 12/23/10 12/23/11  Kathlen Brunswick, MD   No Known Allergies     Past medical history, social history, and family history reviewed and updated.  ROS: Denies chest pain, dyspnea, orthopnea, PND, palps or syncope.  Colonoscopy and polypectomy performed in 05/2011.  All other systems reviewed and are negative.  PHYSICAL EXAM: BP 149/79  Pulse 55  Ht 5\' 7"  (1.702 m)  Wt 77.565 kg (171 lb)  BMI 26.78 kg/m2  General-Well developed; no acute distress Body habitus-proportionate weight and height Neck-No JVD; no carotid bruits Lungs-clear lung fields; resonant to percussion Cardiovascular-normal PMI; normal S1 and S2; +S4 Abdomen-normal bowel sounds; soft and non-tender without masses or organomegaly Musculoskeletal-No deformities, no cyanosis or clubbing Neurologic-Normal cranial nerves; symmetric strength and tone Skin-Warm, no significant lesions Extremities-distal pulses intact; 1+ ankle edema on the right, 1/2+ on the left  ASSESSMENT AND PLAN:  [place] Bing, MD 01/29/2012 2:58 PM

## 2012-01-29 NOTE — Patient Instructions (Addendum)
Your physician recommends that you schedule a follow-up appointment in:  1 - 1 month for a blood pressure check 2 - 1 year with Dr Dietrich Pates  Your physician has requested that you regularly monitor and record your blood pressure readings at home. Please use the same machine at the same time of day to check your readings and record them to bring to your follow-up visit.  Your physician recommends that you return for lab work in: 3 weeks   Your physician recommends that you schedule a follow-up appointment in:  1 - TAKE lisinopril 20 mg daily

## 2012-01-29 NOTE — Assessment & Plan Note (Signed)
Currently no sxs of myocardial ischemia.  Single vessel disease in 05/2009 with total obstruction of the RCA, 40% lesions in the LAD and first marginal, and nl LV EF.  Prognosis is excellent.

## 2012-01-29 NOTE — Assessment & Plan Note (Addendum)
Anemia had resolved when CBC last obtained in 08/2011.

## 2012-01-29 NOTE — Assessment & Plan Note (Addendum)
Blood pressure control has been suboptimal with systolics frequently in the 150-180 range during 2013.  Lisinopril 20 mg per day will be added to the patient's antihypertensive therapy.  He will be asked to obtain a number of blood pressure determinations at local pharmacies and return in one month for reassessment by the cardiology nurses.

## 2012-01-30 ENCOUNTER — Encounter: Payer: Self-pay | Admitting: *Deleted

## 2012-02-08 ENCOUNTER — Other Ambulatory Visit: Payer: Self-pay | Admitting: Cardiology

## 2012-02-09 ENCOUNTER — Ambulatory Visit (HOSPITAL_COMMUNITY): Payer: Medicare Other | Admitting: Oncology

## 2012-02-09 ENCOUNTER — Encounter (HOSPITAL_COMMUNITY): Payer: Self-pay | Admitting: Oncology

## 2012-02-23 ENCOUNTER — Other Ambulatory Visit: Payer: Self-pay | Admitting: *Deleted

## 2012-02-23 ENCOUNTER — Encounter: Payer: Self-pay | Admitting: *Deleted

## 2012-02-23 DIAGNOSIS — I10 Essential (primary) hypertension: Secondary | ICD-10-CM

## 2012-03-11 ENCOUNTER — Ambulatory Visit (INDEPENDENT_AMBULATORY_CARE_PROVIDER_SITE_OTHER): Payer: Medicare Other | Admitting: *Deleted

## 2012-03-11 VITALS — BP 109/67 | HR 61 | Ht 67.0 in | Wt 173.0 lb

## 2012-03-11 DIAGNOSIS — I10 Essential (primary) hypertension: Secondary | ICD-10-CM

## 2012-03-11 NOTE — Progress Notes (Signed)
Presents for blood pressure check today.  Denies complaints and states taking medication, as prescribed.

## 2012-03-13 NOTE — Progress Notes (Signed)
Patient ID: Curtis Lang, male   DOB: 07/07/1930, 78 y.o.   MRN: [id number]  No home blood pressures are available for review.  BP in office remains modestly elevated, but improved.  Add chlorthalidone 12.5 mg per day to patient's regime, ask him to collect additional blood pressures at local pharmacies and to return results to [place], and check BMet in one month

## 2012-03-14 MED ORDER — CHLORTHALIDONE 25 MG PO TABS: 12.5000 mg | ORAL_TABLET | Freq: Every day | ORAL | Status: DC

## 2012-03-14 NOTE — Progress Notes (Signed)
Recommendations given to patient and verbalized understanding.

## 2012-03-14 NOTE — Progress Notes (Signed)
Attempted to contact patient with recommendations. Message left for a return call.

## 2012-04-03 ENCOUNTER — Other Ambulatory Visit: Payer: Self-pay | Admitting: Cardiology

## 2012-04-03 ENCOUNTER — Other Ambulatory Visit: Payer: Self-pay | Admitting: *Deleted

## 2012-04-03 MED ORDER — POTASSIUM CHLORIDE CRYS ER 10 MEQ PO TBCR: 10.0000 meq | EXTENDED_RELEASE_TABLET | Freq: Every day | ORAL | Status: DC

## 2012-04-12 ENCOUNTER — Other Ambulatory Visit: Payer: Self-pay | Admitting: *Deleted

## 2012-04-12 DIAGNOSIS — I10 Essential (primary) hypertension: Secondary | ICD-10-CM

## 2012-04-12 MED ORDER — LISINOPRIL 20 MG PO TABS: 20.0000 mg | ORAL_TABLET | Freq: Every day | ORAL | Status: DC

## 2012-04-12 NOTE — Telephone Encounter (Signed)
Fax Received. Refill Completed. Kenyatte Chatmon Chowoe (R.M.A)   

## 2012-04-25 ENCOUNTER — Encounter: Payer: Self-pay | Admitting: *Deleted

## 2012-09-30 ENCOUNTER — Encounter (HOSPITAL_COMMUNITY): Payer: Self-pay | Admitting: *Deleted

## 2012-10-09 ENCOUNTER — Telehealth: Payer: Self-pay | Admitting: Cardiology

## 2012-10-09 ENCOUNTER — Other Ambulatory Visit: Payer: Self-pay | Admitting: Cardiology

## 2012-10-09 MED ORDER — FUROSEMIDE 40 MG PO TABS: 40.0000 mg | ORAL_TABLET | Freq: Every day | ORAL | Status: DC

## 2012-10-09 NOTE — Telephone Encounter (Signed)
PT NEEDS FUROSIMIDE CALLED IN

## 2012-10-09 NOTE — Telephone Encounter (Signed)
Refill sent in to [facility] for Lasix.

## 2013-02-04 ENCOUNTER — Other Ambulatory Visit: Payer: Self-pay | Admitting: *Deleted

## 2013-02-04 MED ORDER — LISINOPRIL 20 MG PO TABS: 20.0000 mg | ORAL_TABLET | Freq: Every day | ORAL | Status: DC

## 2013-02-11 ENCOUNTER — Other Ambulatory Visit: Payer: Self-pay | Admitting: Cardiology

## 2013-03-05 ENCOUNTER — Encounter (HOSPITAL_COMMUNITY): Payer: Self-pay

## 2013-03-05 ENCOUNTER — Encounter (HOSPITAL_COMMUNITY): Payer: Medicare Other | Attending: Hematology and Oncology

## 2013-03-05 VITALS — BP 143/82 | HR 76 | Temp 97.6°F | Resp 16 | Wt 170.6 lb

## 2013-03-05 DIAGNOSIS — C8330 Diffuse large B-cell lymphoma, unspecified site: Secondary | ICD-10-CM

## 2013-03-05 DIAGNOSIS — C8581 Other specified types of non-Hodgkin lymphoma, lymph nodes of head, face, and neck: Secondary | ICD-10-CM

## 2013-03-05 DIAGNOSIS — C8589 Other specified types of non-Hodgkin lymphoma, extranodal and solid organ sites: Secondary | ICD-10-CM | POA: Insufficient documentation

## 2013-03-05 LAB — COMPREHENSIVE METABOLIC PANEL
CO2: 27 mEq/L (ref 19–32)
Calcium: 9 mg/dL (ref 8.4–10.5)
Creatinine, Ser: 0.91 mg/dL (ref 0.50–1.35)
GFR calc Af Amer: >90 mL/min (ref >90)
GFR calc non Af Amer: 78 mL/min — ABNORMAL LOW (ref >90)
Glucose, Bld: 156 mg/dL — ABNORMAL HIGH (ref 70–99)

## 2013-03-05 LAB — CBC WITH DIFFERENTIAL/PLATELET
Basophils Absolute: 0 10*3/uL (ref 0.0–0.1)
Eosinophils Relative: 7 % — ABNORMAL HIGH (ref 0–5)
Lymphocytes Relative: 18 % (ref 12–46)
MCV: 94 fL (ref 78.0–100.0)
Platelets: 264 10*3/uL (ref 150–400)
RDW: 13.4 % (ref 11.5–15.5)
WBC: 6.2 10*3/uL (ref 4.0–10.5)

## 2013-03-05 LAB — RETICULOCYTES: Retic Ct Pct: 1.5 % (ref 0.4–3.1)

## 2013-03-05 LAB — LACTATE DEHYDROGENASE: LDH: 220 U/L (ref 94–250)
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
## 2013-03-05 NOTE — Patient Instructions (Addendum)
[facility] Discharge Instructions  RECOMMENDATIONS MADE BY THE CONSULTANT AND ANY TEST RESULTS WILL BE SENT TO YOUR REFERRING PHYSICIAN.  Lab work today. See Dr.Bradford tomorrow, Thursday September 25 at 10:00 am. CT scan Wednesday October 1 at 9:30 am at [facility]. Doctor appointment Thursday October 9 at 10:30 am at [facility].   Thank you for choosing [facility] to provide your oncology and hematology care.  To afford each patient quality time with our providers, please arrive at least 15 minutes before your scheduled appointment time.  With your help, our goal is to use those 15 minutes to complete the necessary work-up to ensure our physicians have the information they need to help with your evaluation and healthcare recommendations.    Effective January 1st, 2014, we ask that you re-schedule your appointment with our physicians should you arrive 10 or more minutes late for your appointment.  We strive to give you quality time with our providers, and arriving late affects you and other patients whose appointments are after yours.    Again, thank you for choosing [facility].  Our hope is that these requests will decrease the amount of time that you wait before being seen by our physicians.       _____________________________________________________________  Should you have questions after your visit to [facility], please contact our office at [phone] between the hours of 8:30 a.m. and 5:00 p.m.  Voicemails left after 4:30 p.m. will not be returned until the following business day.  For prescription refill requests, have your pharmacy contact our office with your prescription refill request.

## 2013-03-06 LAB — BETA 2 MICROGLOBULIN, SERUM: Beta-2 Microglobulin: 2.5 mg/L — ABNORMAL HIGH (ref 1.01–1.73)

## 2013-03-07 ENCOUNTER — Encounter (HOSPITAL_COMMUNITY): Payer: Self-pay

## 2013-03-07 ENCOUNTER — Encounter (HOSPITAL_COMMUNITY)
Admission: RE | Admit: 2013-03-07 | Discharge: 2013-03-07 | Disposition: A | Discharge status: Home / Self Care | Payer: Medicare Other | Source: Ambulatory Visit | Attending: General Surgery | Admitting: General Surgery

## 2013-03-07 NOTE — Patient Instructions (Addendum)
    Curtis Lang  03/07/2013   Your procedure is scheduled on:  03/10/2013  Report to [facility] at  740  AM.  Call this number if you have problems the morning of surgery: [phone]   Remember:   Do not eat food or drink liquids after midnight.   Take these medicines the morning of surgery with A SIP OF WATER:  Norvasc, chlorthalidone, lisinopril, phenergan, ultracet   Do not wear jewelry, make-up or nail polish.  Do not wear lotions, powders, or perfumes.   Do not shave 48 hours prior to surgery. Men may shave face and neck.  Do not bring valuables to the hospital.  [facility] is not responsible for any belongings or valuables.               Contacts, dentures or bridgework may not be worn into surgery.  Leave suitcase in the car. After surgery it may be brought to your room.  For patients admitted to the hospital, discharge time is determined by your treatment team.               Patients discharged the day of surgery will not be allowed to drive home.  Name and phone number of your driver: family  Special Instructions: Shower using CHG 2 nights before surgery and the night before surgery.  If you shower the day of surgery use CHG.  Use special wash - you have one bottle of CHG for all showers.  You should use approximately 1/3 of the bottle for each shower.   Please read over the following fact sheets that you were given: Pain Booklet, Coughing and Deep Breathing, Surgical Site Infection Prevention, Anesthesia Post-op Instructions and Care and Recovery After Surgery PATIENT INSTRUCTIONS POST-ANESTHESIA  IMMEDIATELY FOLLOWING SURGERY:  Do not drive or operate machinery for the first twenty four hours after surgery.  Do not make any important decisions for twenty four hours after surgery or while taking narcotic pain medications or sedatives.  If you develop intractable nausea and vomiting or a severe headache please notify your doctor immediately.  FOLLOW-UP:  Please make an  appointment with your surgeon as instructed. You do not need to follow up with anesthesia unless specifically instructed to do so.  WOUND CARE INSTRUCTIONS (if applicable):  Keep a dry clean dressing on the anesthesia/puncture wound site if there is drainage.  Once the wound has quit draining you may leave it open to air.  Generally you should leave the bandage intact for twenty four hours unless there is drainage.  If the epidural site drains for more than 36-48 hours please call the anesthesia department.  QUESTIONS?:  Please feel free to call your physician or the hospital operator if you have any questions, and they will be happy to assist you.

## 2013-03-07 NOTE — Consult Note (Signed)
[name], [name] NO.:  [id number]  MEDICAL RECORD NO.:  [id number]  LOCATION:                                 FACILITY:  PHYSICIAN:  Barbaraann Barthel, M.D. DATE OF BIRTH:  1931/01/10  DATE OF CONSULTATION:  03/06/2013 DATE OF DISCHARGE:                                CONSULTATION   HISTORY OF PRESENT ILLNESS:  This is an 79 year old white male who was treated for B-cell lymphoma.  Back in 2006, he had a Port-A-Cath placed for that purpose.  This Port-A-Cath now is referred from Oncology Department to be removed.  This is apparently nonfunctioning as well as he is not going to undergo any further treatment.  We discussed complications with removing this including bleeding, infection, and unlikely pneumothorax.  Informed consent was obtained.  PAST MEDICAL HISTORY:  As stated, history of B-cell lymphoma, treated with chemotherapy and he has past history of coronary artery disease, hypertension, and hypercholesterolemia.  PAST SURGICAL HISTORY:  Past surgery has included a right inguinal hernia in 1999, balloon angioplasty in 1993.  He had a rod placed in his left leg in 1995.  He had a right cervical disk surgery in 2000.  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  He also had a right axillary node biopsy at the time of his diagnosis of B-cell lymphoma.  MEDICATIONS:  See medication list.  ALLERGIES:  He has no known allergies.  SOCIAL HISTORY:  He does not drink or smoke.  The last time he smoke was in 2003.  PHYSICAL EXAMINATION:  VITAL SIGNS:  He is 5-foot 8 inches, weighs 174 pounds.  His temperature is 96.7, pulse is 60, respirations 12, blood pressure 116/80. GENERAL:  He is in no acute distress. HEENT:  Head is normocephalic.  Eyes, extraocular movements are intact. Pupils are round, reactive to light and accommodation.  There is no conjunctive pallor or scleral  injection.  Sclera has a normal tincture. Nose and oral mucosa are moist.  The patient has dental prosthesis.  No bruits are appreciated and he has no adenopathy appreciated.  No thyromegaly. CHEST:  Clear to both anterior and posterior auscultation. HEART:  Regular rhythm. ABDOMEN:  Soft.  He has no recurrent hernias and no incisional hernias appreciated. RECTAL:  Examination was deferred. EXTREMITIES:  He has had a previous right axillary node biopsy and right leg orthopedic surgery.  REVIEW OF SYSTEMS:  CONSTITUTIONAL:  No history of migraines or seizures, or any lateralizing neurologic findings.  ENDOCRINE:  No history of diabetes, thyroid disease, or adrenal disease. CARDIOPULMONARY:  History of coronary artery disease, hypertension, and hypercholesterolemia and a past history of tobacco abuse.  GI:  No history of hepatitis, constipation, diarrhea, bright red rectal bleeding, melena, or inflammatory disease or irritable bowel syndrome. No history of unexplained weight loss.  The patient has never had a colonoscopy and thus want one.  GU:  No history of frequency, dysuria, or kidney stones.  MUSCULOSKELETAL:  As stated, he had an amputation of his fifth finger on his left hand at the distal portion and previous  right leg orthopedic surgery.  REVIEW OF HISTORY AND PHYSICAL:  Therefore, [name] is an 79 year old white male, who will have his Port-A-Cath removed.  This will be done under slight sedation and local anesthesia.  This will be done as an outpatient and he is to return to follow up with his regular doctors in the Oncology Department.     Barbaraann Barthel, M.D.     WB/MEDQ  D:  03/06/2013  T:  03/07/2013  Job:  [id number]

## 2013-03-10 ENCOUNTER — Ambulatory Visit (HOSPITAL_COMMUNITY)
Admission: RE | Admit: 2013-03-10 | Discharge: 2013-03-10 | Disposition: A | Discharge status: Home / Self Care | Payer: Medicare Other | Source: Ambulatory Visit | Attending: General Surgery | Admitting: General Surgery

## 2013-03-10 ENCOUNTER — Encounter (HOSPITAL_COMMUNITY): Payer: Self-pay | Admitting: Anesthesiology

## 2013-03-10 ENCOUNTER — Encounter (HOSPITAL_COMMUNITY)
Admission: RE | Disposition: A | Discharge status: Home / Self Care | Payer: Self-pay | Source: Ambulatory Visit | Attending: General Surgery

## 2013-03-10 ENCOUNTER — Encounter (HOSPITAL_COMMUNITY): Payer: Self-pay | Admitting: *Deleted

## 2013-03-10 DIAGNOSIS — T82598A Other mechanical complication of other cardiac and vascular devices and implants, initial encounter: Secondary | ICD-10-CM | POA: Insufficient documentation

## 2013-03-10 DIAGNOSIS — Y929 Unspecified place or not applicable: Secondary | ICD-10-CM | POA: Insufficient documentation

## 2013-03-10 DIAGNOSIS — I10 Essential (primary) hypertension: Secondary | ICD-10-CM | POA: Insufficient documentation

## 2013-03-10 DIAGNOSIS — Y849 Medical procedure, unspecified as the cause of abnormal reaction of the patient, or of later complication, without mention of misadventure at the time of the procedure: Secondary | ICD-10-CM | POA: Insufficient documentation

## 2013-03-10 DIAGNOSIS — Z01810 Encounter for preprocedural cardiovascular examination: Secondary | ICD-10-CM | POA: Insufficient documentation

## 2013-03-10 HISTORY — PX: PORT-A-CATH REMOVAL: SHX5289

## 2013-03-10 SURGERY — REMOVAL PORT-A-CATH
Anesthesia: LOCAL | Site: Chest | Laterality: Right | Wound class: Clean

## 2013-03-10 MED ORDER — SODIUM CHLORIDE 0.9 % IR SOLN
Status: DC | PRN
  Administered 2013-03-10: 1000 mL

## 2013-03-10 MED ORDER — LIDOCAINE HCL (PF) 1 % IJ SOLN
INTRAMUSCULAR | Status: AC
  Filled 2013-03-10: qty 30

## 2013-03-10 MED ORDER — WATER FOR IRRIGATION, STERILE IR SOLN
Status: DC | PRN
  Administered 2013-03-10 (×2): 1000 mL

## 2013-03-10 MED ORDER — BACITRACIN-NEOMYCIN-POLYMYXIN 400-5-5000 EX OINT
TOPICAL_OINTMENT | CUTANEOUS | Status: AC
  Filled 2013-03-10: qty 1

## 2013-03-10 MED ORDER — MIDAZOLAM HCL 2 MG/2ML IJ SOLN
INTRAMUSCULAR | Status: AC
  Filled 2013-03-10: qty 2

## 2013-03-10 MED ORDER — CEFAZOLIN SODIUM-DEXTROSE 2-3 GM-% IV SOLR: 2.0000 g | Freq: Once | INTRAVENOUS | Status: DC

## 2013-03-10 MED ORDER — CEFAZOLIN SODIUM-DEXTROSE 2-3 GM-% IV SOLR
INTRAVENOUS | Status: AC
  Filled 2013-03-10: qty 50

## 2013-03-10 MED ORDER — MIDAZOLAM HCL 2 MG/2ML IJ SOLN
1.0000 mg | Freq: Once | INTRAMUSCULAR | Status: AC
  Administered 2013-03-10: 1 mg via INTRAVENOUS

## 2013-03-10 MED ORDER — LIDOCAINE HCL (PF) 1 % IJ SOLN
INTRAMUSCULAR | Status: DC | PRN
  Administered 2013-03-10: 6 mL

## 2013-03-10 MED ORDER — BACITRACIN-NEOMYCIN-POLYMYXIN 400-5-5000 EX OINT
TOPICAL_OINTMENT | CUTANEOUS | Status: DC | PRN
  Administered 2013-03-10: 1 via TOPICAL

## 2013-03-10 SURGICAL SUPPLY — 39 items
BAG HAMPER (MISCELLANEOUS) ×2 IMPLANT
BLADE SURG 15 STRL LF DISP TIS (BLADE) ×2 IMPLANT
BLADE SURG 15 STRL SS (BLADE) ×2
CLOTH BEACON ORANGE TIMEOUT ST (SAFETY) ×2 IMPLANT
COVER LIGHT HANDLE STERIS (MISCELLANEOUS) ×4 IMPLANT
DRAPE LAPAROTOMY TRNSV 102X78 (DRAPE) ×2 IMPLANT
DRSG TEGADERM 2-3/8X2-3/4 SM (GAUZE/BANDAGES/DRESSINGS) ×2 IMPLANT
ELECT REM PT RETURN 9FT ADLT (ELECTROSURGICAL) ×2
ELECTRODE REM PT RTRN 9FT ADLT (ELECTROSURGICAL) ×1 IMPLANT
GAUZE SPONGE 4X4 16PLY XRAY LF (GAUZE/BANDAGES/DRESSINGS) ×2 IMPLANT
GLOVE BIOGEL PI IND STRL 7.0 (GLOVE) ×1 IMPLANT
GLOVE BIOGEL PI INDICATOR 7.0 (GLOVE) ×1
GLOVE EXAM NITRILE MD LF STRL (GLOVE) ×2 IMPLANT
GLOVE SKINSENSE NS SZ7.0 (GLOVE) ×1
GLOVE SKINSENSE STRL SZ7.0 (GLOVE) ×1 IMPLANT
GLOVE SS BIOGEL STRL SZ 6.5 (GLOVE) ×1 IMPLANT
GLOVE SUPERSENSE BIOGEL SZ 6.5 (GLOVE) ×1
GOWN STRL REIN XL XLG (GOWN DISPOSABLE) ×4 IMPLANT
KIT BLADEGUARD II DBL (SET/KITS/TRAYS/PACK) ×2 IMPLANT
KIT ROOM TURNOVER APOR (KITS) ×2 IMPLANT
MANIFOLD NEPTUNE II (INSTRUMENTS) ×2 IMPLANT
MARKER SKIN DUAL TIP RULER LAB (MISCELLANEOUS) ×2 IMPLANT
NEEDLE HYPO 25X1 1.5 SAFETY (NEEDLE) ×2 IMPLANT
NS IRRIG 1000ML POUR BTL (IV SOLUTION) ×2 IMPLANT
PACK BASIC III (CUSTOM PROCEDURE TRAY) ×1
PACK SRG BSC III STRL LF ECLPS (CUSTOM PROCEDURE TRAY) ×1 IMPLANT
PAD ARMBOARD 7.5X6 YLW CONV (MISCELLANEOUS) ×2 IMPLANT
SET BASIN LINEN APH (SET/KITS/TRAYS/PACK) ×2 IMPLANT
SOL PREP PROV IODINE SCRUB 4OZ (MISCELLANEOUS) ×2 IMPLANT
SPONGE GAUZE 2X2 8PLY STRL LF (GAUZE/BANDAGES/DRESSINGS) ×2 IMPLANT
STRIP CLOSURE SKIN 1/4X3 (GAUZE/BANDAGES/DRESSINGS) ×2 IMPLANT
SUT VIC AB 4-0 PS2 27 (SUTURE) ×2 IMPLANT
SUT VIC AB 5-0 P-3 18X BRD (SUTURE) ×1 IMPLANT
SUT VIC AB 5-0 P3 18 (SUTURE) ×1
SUT VICRYL AB 3 0 TIES (SUTURE) ×2 IMPLANT
SYR BULB IRRIGATION 50ML (SYRINGE) ×2 IMPLANT
SYR CONTROL 10ML LL (SYRINGE) ×2 IMPLANT
WATER STERILE IRR 1000ML POUR (IV SOLUTION) ×4 IMPLANT
YANKAUER SUCT 12FT TUBE ARGYLE (SUCTIONS) ×2 IMPLANT

## 2013-03-10 NOTE — Progress Notes (Addendum)
79 yr old W. Male for removal of non functioning porta cath.  Will be removed under moderate sedation.  Procedure and riks explained and informed consent obtained.  No change clinically in H&P. Dict.#[id number].     97.6  HR 76, BP 143/82  Resp. 16/min O2 sat 100% RA

## 2013-03-10 NOTE — Brief Op Note (Signed)
03/10/2013  9:58 AM  PATIENT:  Curtis Lang  79 y.o. male  PRE-OPERATIVE DIAGNOSIS:  non-functioning port a cath  POST-OPERATIVE DIAGNOSIS:  non-functioning port a cath, oncology treatment complete  PROCEDURE:  Procedure(s): REMOVAL PORT-A-CATH (Right)  SURGEON:  Surgeon(s) and Role:    * Marlane Hatcher, MD - Primary  PHYSICIAN ASSISTANT:   ASSISTANTS: none   ANESTHESIA:   IV sedation  EBL:     BLOOD ADMINISTERED:none  DRAINS: none   LOCAL MEDICATIONS USED:  XYLOCAINE 1 % without epi~ 5 cc.  SPECIMEN:  Source of Specimen:  Porta cath and infusion device from Right subclavian vein.  DISPOSITION OF SPECIMEN:  N/A  COUNTS:  YES  TOURNIQUET:  * No tourniquets in log *  DICTATION: .Other Dictation: Dictation Number OR dict. # [id number].  PLAN OF CARE: Discharge to home after PACU  PATIENT DISPOSITION:  PACU - hemodynamically stable.   Delay start of Pharmacological VTE agent (>24hrs) due to surgical blood loss or risk of bleeding: not applicable

## 2013-03-10 NOTE — Progress Notes (Signed)
Post OP Check   Minimal discomfort. Dressing clean and dry.   Filed Vitals:   03/10/13 1015  BP: 155/76  Pulse: 59  Temp: 97.5 F (36.4 C)  Resp: 16   Discharge and follow up arranged.

## 2013-03-11 NOTE — Op Note (Signed)
NAMEBREKKEN, BEACH NO.:  [id number]  MEDICAL RECORD NO.:  [id number]  LOCATION:  [facility]                          FACILITY:  [facility]  PHYSICIAN:  Barbaraann Barthel, M.D. DATE OF BIRTH:  [birth year]  DATE OF PROCEDURE:  03/10/2013 DATE OF DISCHARGE:  03/10/2013                              OPERATIVE REPORT   DIAGNOSIS:  B-cell lymphoma, nonfunctioning Port-A-Cath.  Note, this is an 79 year old white male who was referred from Oncology Department for removal of his Port-A-Cath.  He was treated for beta cell lymphoma in 2006.  He has not had his Port-A-Cath flushed in the last 2 years and it is nonfunctioning and this was asked to be removed.  We did this under local anesthetic with some Versed sedation.  This was done in the operating room.  SPECIMEN:  No specimen was sent to Pathology, however, the specimen was the Port-A-Cath and its infusion device.  Wound classification is clean.  No drains were placed.  TECHNIQUE:  The patient was placed in supine position.  After prepping with Betadine solution and draping in the usual manner, I used 1% Xylocaine without epinephrine to anesthetize an area where the Port-A- Cath infusion device was.  The catheter was then removed after incising its pseudocapsule where the pseudocapsule was around the silastic catheter, this was clamped and ligated with 3-0 Polysorb.  We then irrigated the wound and I  checked for hemostasis.  There was minimal oozing, this was controlled with a cautery device.  The subcutaneous tissue was closed with 4-0 Polysorb and the skin with 5-0 Polysorb subcuticularly.  Quarter-inch Steri-Strips, Neosporin, and 2 x 2 Tegaderm dressing was applied.  No drains were placed.  Prior to closure, all sponge, needle, and instrument counts were found to be correct.  Estimated blood loss was minimal.  The patient tolerated procedure well and was taken to the outpatient department for discharge.  The patient  will be followed by the Oncology Department.     Barbaraann Barthel, M.D.     WB/MEDQ  D:  03/10/2013  T:  03/11/2013  Job:  [id number]  cc:   Oncology Department

## 2013-03-12 ENCOUNTER — Ambulatory Visit (HOSPITAL_COMMUNITY): Payer: Medicare Other

## 2013-03-12 ENCOUNTER — Encounter (HOSPITAL_COMMUNITY): Payer: Self-pay | Admitting: General Surgery

## 2013-03-13 ENCOUNTER — Ambulatory Visit (HOSPITAL_COMMUNITY)
Admission: RE | Admit: 2013-03-13 | Discharge: 2013-03-13 | Disposition: A | Discharge status: Home / Self Care | Payer: Medicare Other | Source: Ambulatory Visit | Attending: Hematology and Oncology | Admitting: Hematology and Oncology

## 2013-03-13 ENCOUNTER — Other Ambulatory Visit (HOSPITAL_COMMUNITY): Payer: Self-pay | Admitting: Hematology and Oncology

## 2013-03-13 DIAGNOSIS — C8330 Diffuse large B-cell lymphoma, unspecified site: Secondary | ICD-10-CM

## 2013-03-13 DIAGNOSIS — J438 Other emphysema: Secondary | ICD-10-CM | POA: Insufficient documentation

## 2013-03-13 DIAGNOSIS — N400 Enlarged prostate without lower urinary tract symptoms: Secondary | ICD-10-CM | POA: Insufficient documentation

## 2013-03-13 DIAGNOSIS — C8589 Other specified types of non-Hodgkin lymphoma, extranodal and solid organ sites: Secondary | ICD-10-CM | POA: Insufficient documentation

## 2013-03-13 MED ORDER — IOHEXOL 300 MG/ML  SOLN
100.0000 mL | Freq: Once | INTRAMUSCULAR | Status: AC | PRN
  Administered 2013-03-13: 100 mL via INTRAVENOUS

## 2013-03-20 ENCOUNTER — Encounter (HOSPITAL_COMMUNITY): Payer: Self-pay

## 2013-03-20 ENCOUNTER — Encounter (HOSPITAL_COMMUNITY): Payer: Medicare Other | Attending: Hematology and Oncology

## 2013-03-20 VITALS — BP 141/66 | HR 56 | Temp 97.5°F | Resp 18 | Wt 170.6 lb

## 2013-03-20 DIAGNOSIS — C8330 Diffuse large B-cell lymphoma, unspecified site: Secondary | ICD-10-CM

## 2013-03-20 DIAGNOSIS — C8583 Other specified types of non-Hodgkin lymphoma, intra-abdominal lymph nodes: Secondary | ICD-10-CM

## 2013-03-20 DIAGNOSIS — N400 Enlarged prostate without lower urinary tract symptoms: Secondary | ICD-10-CM | POA: Insufficient documentation

## 2013-03-20 DIAGNOSIS — C8589 Other specified types of non-Hodgkin lymphoma, extranodal and solid organ sites: Secondary | ICD-10-CM | POA: Insufficient documentation

## 2013-03-20 NOTE — Patient Instructions (Signed)
[facility] Discharge Instructions  RECOMMENDATIONS MADE BY THE CONSULTANT AND ANY TEST RESULTS WILL BE SENT TO YOUR REFERRING PHYSICIAN.  CT scan negative for any active/recurrent disease. Follow-up in 1 year with lab work, CT scan and MD appointment. Report any issues/concerns to clinic as needed.  Thank you for choosing [facility] to provide your oncology and hematology care.  To afford each patient quality time with our providers, please arrive at least 15 minutes before your scheduled appointment time.  With your help, our goal is to use those 15 minutes to complete the necessary work-up to ensure our physicians have the information they need to help with your evaluation and healthcare recommendations.    Effective January 1st, 2014, we ask that you re-schedule your appointment with our physicians should you arrive 10 or more minutes late for your appointment.  We strive to give you quality time with our providers, and arriving late affects you and other patients whose appointments are after yours.    Again, thank you for choosing [facility].  Our hope is that these requests will decrease the amount of time that you wait before being seen by our physicians.       _____________________________________________________________  Should you have questions after your visit to [facility], please contact our office at [phone] between the hours of 8:30 a.m. and 5:00 p.m.  Voicemails left after 4:30 p.m. will not be returned until the following business day.  For prescription refill requests, have your pharmacy contact our office with your prescription refill request.
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
## 2013-04-14 ENCOUNTER — Other Ambulatory Visit: Payer: Self-pay | Admitting: Cardiology

## 2013-04-15 ENCOUNTER — Other Ambulatory Visit (HOSPITAL_COMMUNITY): Payer: Self-pay | Admitting: Family Medicine

## 2013-04-15 DIAGNOSIS — M5030 Other cervical disc degeneration, unspecified cervical region: Secondary | ICD-10-CM

## 2013-04-17 ENCOUNTER — Ambulatory Visit (HOSPITAL_COMMUNITY)
Admission: RE | Admit: 2013-04-17 | Discharge: 2013-04-17 | Disposition: A | Discharge status: Home / Self Care | Payer: Medicare Other | Source: Ambulatory Visit | Attending: Family Medicine | Admitting: Family Medicine

## 2013-04-17 DIAGNOSIS — M542 Cervicalgia: Secondary | ICD-10-CM | POA: Insufficient documentation

## 2013-04-17 DIAGNOSIS — M5030 Other cervical disc degeneration, unspecified cervical region: Secondary | ICD-10-CM

## 2013-04-17 DIAGNOSIS — M47812 Spondylosis without myelopathy or radiculopathy, cervical region: Secondary | ICD-10-CM | POA: Insufficient documentation

## 2013-04-22 ENCOUNTER — Emergency Department (HOSPITAL_COMMUNITY)
Admission: EM | Admit: 2013-04-22 | Discharge: 2013-04-22 | Disposition: A | Discharge status: Home / Self Care | Payer: Medicare Other | Attending: Emergency Medicine | Admitting: Emergency Medicine

## 2013-04-22 ENCOUNTER — Encounter (HOSPITAL_COMMUNITY): Payer: Self-pay | Admitting: Emergency Medicine

## 2013-04-22 ENCOUNTER — Emergency Department (HOSPITAL_COMMUNITY): Payer: Medicare Other

## 2013-04-22 DIAGNOSIS — Z923 Personal history of irradiation: Secondary | ICD-10-CM | POA: Insufficient documentation

## 2013-04-22 DIAGNOSIS — J4489 Other specified chronic obstructive pulmonary disease: Secondary | ICD-10-CM | POA: Insufficient documentation

## 2013-04-22 DIAGNOSIS — E7800 Pure hypercholesterolemia, unspecified: Secondary | ICD-10-CM | POA: Insufficient documentation

## 2013-04-22 DIAGNOSIS — I739 Peripheral vascular disease, unspecified: Secondary | ICD-10-CM | POA: Insufficient documentation

## 2013-04-22 DIAGNOSIS — Z85828 Personal history of other malignant neoplasm of skin: Secondary | ICD-10-CM | POA: Insufficient documentation

## 2013-04-22 DIAGNOSIS — I10 Essential (primary) hypertension: Secondary | ICD-10-CM | POA: Insufficient documentation

## 2013-04-22 DIAGNOSIS — Z87898 Personal history of other specified conditions: Secondary | ICD-10-CM | POA: Insufficient documentation

## 2013-04-22 DIAGNOSIS — Z711 Person with feared health complaint in whom no diagnosis is made: Secondary | ICD-10-CM

## 2013-04-22 DIAGNOSIS — Z7982 Long term (current) use of aspirin: Secondary | ICD-10-CM | POA: Insufficient documentation

## 2013-04-22 DIAGNOSIS — F419 Anxiety disorder, unspecified: Secondary | ICD-10-CM

## 2013-04-22 DIAGNOSIS — Z8673 Personal history of transient ischemic attack (TIA), and cerebral infarction without residual deficits: Secondary | ICD-10-CM | POA: Insufficient documentation

## 2013-04-22 DIAGNOSIS — R011 Cardiac murmur, unspecified: Secondary | ICD-10-CM | POA: Insufficient documentation

## 2013-04-22 DIAGNOSIS — I2510 Atherosclerotic heart disease of native coronary artery without angina pectoris: Secondary | ICD-10-CM | POA: Insufficient documentation

## 2013-04-22 DIAGNOSIS — R51 Headache: Secondary | ICD-10-CM | POA: Insufficient documentation

## 2013-04-22 DIAGNOSIS — F411 Generalized anxiety disorder: Secondary | ICD-10-CM | POA: Insufficient documentation

## 2013-04-22 DIAGNOSIS — J449 Chronic obstructive pulmonary disease, unspecified: Secondary | ICD-10-CM | POA: Insufficient documentation

## 2013-04-22 DIAGNOSIS — Z87891 Personal history of nicotine dependence: Secondary | ICD-10-CM | POA: Insufficient documentation

## 2013-04-22 DIAGNOSIS — Z79899 Other long term (current) drug therapy: Secondary | ICD-10-CM | POA: Insufficient documentation

## 2013-04-22 DIAGNOSIS — M5030 Other cervical disc degeneration, unspecified cervical region: Secondary | ICD-10-CM | POA: Insufficient documentation

## 2013-04-22 LAB — COMPREHENSIVE METABOLIC PANEL
AST: 19 U/L (ref 0–37)
Albumin: 3.6 g/dL (ref 3.5–5.2)
Alkaline Phosphatase: 93 U/L (ref 39–117)
BUN: 13 mg/dL (ref 6–23)
Potassium: 3.7 mEq/L (ref 3.5–5.1)
Total Bilirubin: 0.4 mg/dL (ref 0.3–1.2)
Total Protein: 7.5 g/dL (ref 6.0–8.3)

## 2013-04-22 LAB — CBC WITH DIFFERENTIAL/PLATELET
Basophils Relative: 0 % (ref 0–1)
Eosinophils Absolute: 0.2 10*3/uL (ref 0.0–0.7)
Hemoglobin: 14 g/dL (ref 13.0–17.0)
Lymphocytes Relative: 14 % (ref 12–46)
MCH: 32 pg (ref 26.0–34.0)
MCHC: 33.9 g/dL (ref 30.0–36.0)
Monocytes Relative: 7 % (ref 3–12)
Neutrophils Relative %: 76 % (ref 43–77)
Platelets: 236 10*3/uL (ref 150–400)
WBC: 7.3 10*3/uL (ref 4.0–10.5)

## 2013-04-22 LAB — URINALYSIS, ROUTINE W REFLEX MICROSCOPIC
Bilirubin Urine: NEGATIVE
Glucose, UA: NEGATIVE mg/dL
Hgb urine dipstick: NEGATIVE
Ketones, ur: NEGATIVE mg/dL
Leukocytes, UA: NEGATIVE
Protein, ur: 30 mg/dL — AB
Specific Gravity, Urine: 1.015 (ref 1.005–1.030)
pH: 7.5 (ref 5.0–8.0)

## 2013-04-22 LAB — URINE MICROSCOPIC-ADD ON

## 2013-04-22 NOTE — Discharge Instructions (Signed)
Return to the ER if your symptoms worsen.

## 2013-04-22 NOTE — ED Provider Notes (Signed)
CSN: [id number]     Arrival date & time 04/22/13  0557 History   First MD Initiated Contact with Patient 04/22/13 0615     Chief Complaint  Patient presents with  . Weakness   (Consider location/radiation/quality/duration/timing/severity/associated sxs/prior Treatment) HPI Comments: 79 year old male with a history of hypertension, coronary disease, hyperlipidemia, COPD and a history of lymphoma. He presents with a complaint of "I don't feel right". He is unable to tell me any more specifics but on a long list of review of systems questions he does admit to having mild nausea which has essentially resolved and a headache for the last 3 or 4 days. He denies fevers, chills, cough, shortness of breath, dysuria, diarrhea, rashes, swelling. He admits to a generalized fatigue as well.  Patient is a 79 y.o. male presenting with weakness. The history is provided by the patient and a friend.  Weakness    Past Medical History  Diagnosis Date  . Arteriosclerotic cardiovascular disease (ASCVD)     BMS to RCA 1997; cutting ballon for RCA restenosis in 2001; cath 12/10 - 100% RCA L->R collaterals; nl EF; 40% LAD and OM1  . Hypertension   . Hyperlipidemia   . Cerebrovascular disease     bilateral bruits; duplex in 2009 - mild plaque w/o stenosis  . Peripheral vascular disease     ABI on 0.87 on left  . Lymphoma     chemotherapy and radiation therapy in 2006; recurrence in 2007  . Squamous cell carcinoma     s/p Mohs surgery  . Tobacco abuse, in remission     60 pack years; discontinued in 2003  . COPD (chronic obstructive pulmonary disease)   . Fasting hyperglycemia   . DDD (degenerative disc disease), cervical   . Muscle discomfort     No improvement after statins temporary discontinued  . Cancer     b cell lymphoma  . 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 Hypercholesteremia    Past Surgical History  Procedure Laterality Date  . Anterior fusion cervical spine  2003    posterior?   . Inguinal hernia repair  1996    Right  . Orif femur fracture  1996  . Mohs surgery    . Colonoscopy  05/16/2011    Procedure: COLONOSCOPY;  Surgeon: Dalia Heading; diverticulosis, sigmoid polypectomy  . Port-a-cath removal Right 03/10/2013    Procedure: REMOVAL PORT-A-CATH;  Surgeon: Marlane Hatcher, MD;  Location: [facility];  Service: General;  Laterality: Right;   Family History  Problem Relation Age of Onset  . Stroke Father     deceased - 61  . Stroke Mother     deceased - 83   . Heart attack Brother 56  . Heart attack Sister   . Colon cancer Neg Hx    History  Substance Use Topics  . Smoking status: Former Smoker -- 1.00 packs/day for 30 years  . Smokeless tobacco: Not on file  . Alcohol Use: No    Review of Systems  Neurological: Positive for weakness.  All other systems reviewed and are negative.    Allergies  Review of patient's allergies indicates no known allergies.  Home Medications   Current Outpatient Rx  Name  Route  Sig  Dispense  Refill  . aspirin 81 MG EC tablet   Oral   Take 162 mg by mouth daily.          . furosemide (LASIX) 40 MG tablet   Oral  Take 1 tablet (40 mg total) by mouth daily.   30 tablet   6     Generic WUJ:WJXBJ    40MG  Generic YNW:GNFAO    27M .Marland Kitchen.   . ibuprofen (ADVIL,MOTRIN) 200 MG tablet   Oral   Take 200 mg by mouth every 6 (six) hours as needed. Aches and pain         . lisinopril (PRINIVIL,ZESTRIL) 20 MG tablet   Oral   Take 1 tablet (20 mg total) by mouth daily.   90 tablet   0     Pt needs appointment for further refills.   Marland Kitchen omeprazole (PRILOSEC) 20 MG capsule   Oral   Take 20 mg by mouth daily.         . potassium chloride (K-DUR,KLOR-CON) 10 MEQ tablet   Oral   Take 1 tablet (10 mEq total) by mouth daily.   30 tablet   11     Generic For:K-DUR    CR Generic For:K-DUR    ...   . EXPIRED: amLODipine (NORVASC) 5 MG tablet   Oral   Take 1 tablet (5 mg total) by mouth daily.   30  tablet   6   . atorvastatin (LIPITOR) 40 MG tablet      TAKE 1 TABLET BY MOUTH DAILY.   30 tablet   2     .Marland KitchenPatient needs to contact office to schedule  App ...   . chlorthalidone (HYGROTON) 25 MG tablet   Oral   Take 0.5 tablets (12.5 mg total) by mouth daily.   30 tablet   11   . naproxen sodium (ANAPROX) 220 MG tablet   Oral   Take 220 mg by mouth daily as needed. Pain/ OTC         . promethazine (PHENERGAN) 25 MG tablet   Oral   Take 25 mg by mouth Every 6 hours as needed. Nausea/vomiting         . traMADol-acetaminophen (ULTRACET) 37.5-325 MG per tablet                BP 179/75  Pulse 55  Temp(Src) 97.5 F (36.4 C) (Oral)  Resp 18  Ht 5\' 6"  (1.676 m)  Wt 165 lb (74.844 kg)  BMI 26.64 kg/m2  SpO2 97% Physical Exam  Nursing note and vitals reviewed. Constitutional: He appears well-developed and well-nourished. No distress.  HENT:  Head: Normocephalic and atraumatic.  Mouth/Throat: Oropharynx is clear and moist. No oropharyngeal exudate.  Eyes: Conjunctivae and EOM are normal. Pupils are equal, round, and reactive to light. Right eye exhibits no discharge. Left eye exhibits no discharge. No scleral icterus.  Neck: Normal range of motion. Neck supple. No JVD present. No thyromegaly present.  Cardiovascular: Normal rate, regular rhythm and intact distal pulses.  Exam reveals no gallop and no friction rub.   Murmur heard. Pulmonary/Chest: Effort normal and breath sounds normal. No respiratory distress. He has no wheezes. He has no rales.  Abdominal: Soft. Bowel sounds are normal. He exhibits no distension and no mass. There is no tenderness.  Musculoskeletal: Normal range of motion. He exhibits no edema and no tenderness.  Lymphadenopathy:    He has no cervical adenopathy.  Neurological: He is alert. Coordination normal.  Neurologic exam:  Speech clear, pupils equal round reactive to light, extraocular movements intact  Normal peripheral visual  fields Cranial nerves III through XII normal including no facial droop Follows commands, moves all extremities x4, normal strength to bilateral  upper and lower extremities at all major muscle groups including grip Sensation normal to light touch and pinprick Coordination intact, no limb ataxia, finger-nose-finger normal Rapid alternating movements normal No pronator drift Gait normal   Skin: Skin is warm and dry. No rash noted. No erythema.  Psychiatric: He has a normal mood and affect. His behavior is normal.    ED Course  Procedures (including critical care time) Labs Review Labs Reviewed  COMPREHENSIVE METABOLIC PANEL - Abnormal; Notable for the following:    Glucose, Bld 135 (*)    GFR calc non Af Amer 62 (*)    GFR calc Af Amer 72 (*)    All other components within normal limits  URINALYSIS, ROUTINE W REFLEX MICROSCOPIC - Abnormal; Notable for the following:    Protein, ur 30 (*)    All other components within normal limits  CBC WITH DIFFERENTIAL  URINE MICROSCOPIC-ADD ON   Imaging Review Ct Head Wo Contrast  04/22/2013   CLINICAL DATA:  Headache. History of lymphoma.  EXAM: CT HEAD WITHOUT CONTRAST  TECHNIQUE: Contiguous axial images were obtained from the base of the skull through the vertex without intravenous contrast.  COMPARISON:  None.  FINDINGS: There is cortical atrophy and chronic microvascular ischemic change. Mega cisterna magna is incidentally noted. No evidence of acute abnormality including infarction, hemorrhage, mass lesion, mass effect, midline shift or abnormal extra-axial fluid collection is identified. There is no hydrocephalus or pneumocephalus. The patient is status post bilateral lens extraction. The calvarium is intact.  IMPRESSION: No acute finding.  Atrophy and chronic microvascular ischemic change.   Electronically Signed   By: Drusilla Kanner M.D.   On: 04/22/2013 07:10    EKG Interpretation     Ventricular Rate:  55 PR Interval:  226 QRS  Duration: 116 QT Interval:  484 QTC Calculation: 463 R Axis:   -56 Text Interpretation:  Sinus bradycardia with 1st degree A-V block Left axis deviation Left anterior fasicular block Inferior infarct , age undetermined Anteroseptal infarct (cited on or before 03-Feb-2008) Abnormal ECG When compared with ECG of 08-Jan-2011 16:24, No significant change was found            MDM   1. Worried well   2. Anxiety    The patient was able to ambulate to the car by himself this morning, he has no focal weakness or deficits on his exam, he has a normal cardiac and pulmonary exam except for a mild heart murmur. We'll proceed with workup for sources of generalized fatigue and weakness including EKG, lab work. We'll also do a CT scan of the head secondary to significant headache over the last 3 or 4 days.  Labs and CT normal - pt informed of results - seeing his physician today.    VS sstable.    Vida Roller, MD 04/22/13 [phone]

## 2013-04-22 NOTE — ED Notes (Signed)
Pt states he woke this morning & just does not feel right, feels weak. Reported by friend in room he was not feeling good last night around 2100 before leaving her house. Pt ambulated to room w/ no complications.

## 2013-05-12 ENCOUNTER — Other Ambulatory Visit: Payer: Self-pay | Admitting: Cardiology

## 2013-05-15 ENCOUNTER — Inpatient Hospital Stay (HOSPITAL_COMMUNITY)
Admission: EM | Admit: 2013-05-15 | Discharge: 2013-05-17 | DRG: 309 | Disposition: A | Discharge status: Home / Self Care | Payer: Medicare Other | Attending: Internal Medicine | Admitting: Internal Medicine

## 2013-05-15 ENCOUNTER — Encounter (HOSPITAL_COMMUNITY): Payer: Self-pay | Admitting: Emergency Medicine

## 2013-05-15 ENCOUNTER — Emergency Department (HOSPITAL_COMMUNITY): Payer: Medicare Other

## 2013-05-15 DIAGNOSIS — Z9221 Personal history of antineoplastic chemotherapy: Secondary | ICD-10-CM

## 2013-05-15 DIAGNOSIS — D649 Anemia, unspecified: Secondary | ICD-10-CM

## 2013-05-15 DIAGNOSIS — N400 Enlarged prostate without lower urinary tract symptoms: Secondary | ICD-10-CM

## 2013-05-15 DIAGNOSIS — Z8673 Personal history of transient ischemic attack (TIA), and cerebral infarction without residual deficits: Secondary | ICD-10-CM

## 2013-05-15 DIAGNOSIS — Z8249 Family history of ischemic heart disease and other diseases of the circulatory system: Secondary | ICD-10-CM

## 2013-05-15 DIAGNOSIS — C8589 Other specified types of non-Hodgkin lymphoma, extranodal and solid organ sites: Secondary | ICD-10-CM | POA: Diagnosis present

## 2013-05-15 DIAGNOSIS — J4489 Other specified chronic obstructive pulmonary disease: Secondary | ICD-10-CM

## 2013-05-15 DIAGNOSIS — Z23 Encounter for immunization: Secondary | ICD-10-CM

## 2013-05-15 DIAGNOSIS — I509 Heart failure, unspecified: Secondary | ICD-10-CM

## 2013-05-15 DIAGNOSIS — I4891 Unspecified atrial fibrillation: Principal | ICD-10-CM

## 2013-05-15 DIAGNOSIS — F17201 Nicotine dependence, unspecified, in remission: Secondary | ICD-10-CM

## 2013-05-15 DIAGNOSIS — I10 Essential (primary) hypertension: Secondary | ICD-10-CM

## 2013-05-15 DIAGNOSIS — J811 Chronic pulmonary edema: Secondary | ICD-10-CM

## 2013-05-15 DIAGNOSIS — J449 Chronic obstructive pulmonary disease, unspecified: Secondary | ICD-10-CM | POA: Diagnosis present

## 2013-05-15 DIAGNOSIS — Z87891 Personal history of nicotine dependence: Secondary | ICD-10-CM

## 2013-05-15 DIAGNOSIS — Z7982 Long term (current) use of aspirin: Secondary | ICD-10-CM

## 2013-05-15 DIAGNOSIS — Z923 Personal history of irradiation: Secondary | ICD-10-CM

## 2013-05-15 DIAGNOSIS — R079 Chest pain, unspecified: Secondary | ICD-10-CM

## 2013-05-15 DIAGNOSIS — M5030 Other cervical disc degeneration, unspecified cervical region: Secondary | ICD-10-CM

## 2013-05-15 DIAGNOSIS — R0789 Other chest pain: Secondary | ICD-10-CM

## 2013-05-15 DIAGNOSIS — I2510 Atherosclerotic heart disease of native coronary artery without angina pectoris: Secondary | ICD-10-CM

## 2013-05-15 DIAGNOSIS — Z823 Family history of stroke: Secondary | ICD-10-CM

## 2013-05-15 DIAGNOSIS — Z9861 Coronary angioplasty status: Secondary | ICD-10-CM

## 2013-05-15 DIAGNOSIS — E785 Hyperlipidemia, unspecified: Secondary | ICD-10-CM | POA: Diagnosis present

## 2013-05-15 DIAGNOSIS — C8330 Diffuse large B-cell lymphoma, unspecified site: Secondary | ICD-10-CM

## 2013-05-15 DIAGNOSIS — I739 Peripheral vascular disease, unspecified: Secondary | ICD-10-CM

## 2013-05-15 DIAGNOSIS — E7800 Pure hypercholesterolemia, unspecified: Secondary | ICD-10-CM | POA: Diagnosis present

## 2013-05-15 DIAGNOSIS — I679 Cerebrovascular disease, unspecified: Secondary | ICD-10-CM

## 2013-05-15 DIAGNOSIS — I5022 Chronic systolic (congestive) heart failure: Secondary | ICD-10-CM

## 2013-05-15 LAB — PRO B NATRIURETIC PEPTIDE
Pro B Natriuretic peptide (BNP): 1558 pg/mL — ABNORMAL HIGH (ref 0–450)
Pro B Natriuretic peptide (BNP): 1687 pg/mL — ABNORMAL HIGH (ref 0–450)

## 2013-05-15 LAB — CBC
HCT: 40.2 % (ref 39.0–52.0)
Hemoglobin: 13.7 g/dL (ref 13.0–17.0)
MCH: 32.7 pg (ref 26.0–34.0)
RBC: 4.19 MIL/uL — ABNORMAL LOW (ref 4.22–5.81)
WBC: 9.1 10*3/uL (ref 4.0–10.5)

## 2013-05-15 LAB — COMPREHENSIVE METABOLIC PANEL
ALT: 16 U/L (ref 0–53)
AST: 21 U/L (ref 0–37)
Alkaline Phosphatase: 93 U/L (ref 39–117)
BUN: 15 mg/dL (ref 6–23)
CO2: 24 mEq/L (ref 19–32)
GFR calc Af Amer: 74 mL/min — ABNORMAL LOW (ref >90)
GFR calc non Af Amer: 64 mL/min — ABNORMAL LOW (ref >90)
Glucose, Bld: 110 mg/dL — ABNORMAL HIGH (ref 70–99)
Potassium: 3.9 mEq/L (ref 3.5–5.1)
Sodium: 136 mEq/L (ref 135–145)

## 2013-05-15 MED ORDER — ONDANSETRON HCL 4 MG/2ML IJ SOLN: 4.0000 mg | Freq: Four times a day (QID) | INTRAMUSCULAR | Status: DC | PRN

## 2013-05-15 MED ORDER — PANTOPRAZOLE SODIUM 40 MG PO TBEC
40.0000 mg | DELAYED_RELEASE_TABLET | Freq: Every day | ORAL | Status: DC
  Administered 2013-05-16 – 2013-05-17 (×2): 40 mg via ORAL
  Filled 2013-05-15 (×2): qty 1

## 2013-05-15 MED ORDER — LISINOPRIL 10 MG PO TABS
20.0000 mg | ORAL_TABLET | Freq: Every day | ORAL | Status: DC
  Administered 2013-05-16 – 2013-05-17 (×2): 20 mg via ORAL
  Filled 2013-05-15 (×2): qty 2

## 2013-05-15 MED ORDER — ACETAMINOPHEN 650 MG RE SUPP: 650.0000 mg | Freq: Four times a day (QID) | RECTAL | Status: DC | PRN

## 2013-05-15 MED ORDER — FUROSEMIDE 10 MG/ML IJ SOLN
40.0000 mg | Freq: Once | INTRAMUSCULAR | Status: AC
  Administered 2013-05-15: 40 mg via INTRAVENOUS
  Filled 2013-05-15: qty 4

## 2013-05-15 MED ORDER — OXYCODONE HCL 5 MG PO TABS: 5.0000 mg | ORAL_TABLET | ORAL | Status: DC | PRN

## 2013-05-15 MED ORDER — SODIUM CHLORIDE 0.9 % IJ SOLN
3.0000 mL | Freq: Two times a day (BID) | INTRAMUSCULAR | Status: DC
  Administered 2013-05-16 – 2013-05-17 (×2): 3 mL via INTRAVENOUS

## 2013-05-15 MED ORDER — ASPIRIN 81 MG PO CHEW
324.0000 mg | CHEWABLE_TABLET | Freq: Once | ORAL | Status: AC
  Administered 2013-05-15: 324 mg via ORAL
  Filled 2013-05-15: qty 4

## 2013-05-15 MED ORDER — INFLUENZA VAC SPLIT QUAD 0.5 ML IM SUSP
0.5000 mL | INTRAMUSCULAR | Status: DC
  Filled 2013-05-15: qty 0.5

## 2013-05-15 MED ORDER — SODIUM CHLORIDE 0.9 % IJ SOLN: 3.0000 mL | INTRAMUSCULAR | Status: DC | PRN

## 2013-05-15 MED ORDER — ACETAMINOPHEN 325 MG PO TABS: 650.0000 mg | ORAL_TABLET | Freq: Four times a day (QID) | ORAL | Status: DC | PRN

## 2013-05-15 MED ORDER — SENNOSIDES-DOCUSATE SODIUM 8.6-50 MG PO TABS: 1.0000 | ORAL_TABLET | Freq: Every evening | ORAL | Status: DC | PRN

## 2013-05-15 MED ORDER — ATORVASTATIN CALCIUM 40 MG PO TABS
40.0000 mg | ORAL_TABLET | Freq: Every day | ORAL | Status: DC
  Administered 2013-05-16: 40 mg via ORAL
  Filled 2013-05-15: qty 1

## 2013-05-15 MED ORDER — SODIUM CHLORIDE 0.9 % IV SOLN: 250.0000 mL | INTRAVENOUS | Status: DC | PRN

## 2013-05-15 MED ORDER — ASPIRIN EC 81 MG PO TBEC
81.0000 mg | DELAYED_RELEASE_TABLET | Freq: Every day | ORAL | Status: DC
  Administered 2013-05-16: 81 mg via ORAL
  Filled 2013-05-15 (×3): qty 1

## 2013-05-15 MED ORDER — ONDANSETRON HCL 4 MG PO TABS: 4.0000 mg | ORAL_TABLET | Freq: Four times a day (QID) | ORAL | Status: DC | PRN

## 2013-05-15 MED ORDER — METOPROLOL TARTRATE 1 MG/ML IV SOLN
5.0000 mg | INTRAVENOUS | Status: DC | PRN
  Administered 2013-05-16: 5 mg via INTRAVENOUS
  Filled 2013-05-15: qty 5

## 2013-05-15 MED ORDER — ENOXAPARIN SODIUM 40 MG/0.4ML ~~LOC~~ SOLN
40.0000 mg | SUBCUTANEOUS | Status: DC
  Administered 2013-05-15 – 2013-05-16 (×2): 40 mg via SUBCUTANEOUS
  Filled 2013-05-15 (×2): qty 0.4

## 2013-05-15 MED ORDER — FUROSEMIDE 10 MG/ML IJ SOLN
40.0000 mg | Freq: Every day | INTRAMUSCULAR | Status: DC
  Administered 2013-05-16 – 2013-05-17 (×2): 40 mg via INTRAVENOUS
  Filled 2013-05-15 (×2): qty 4

## 2013-05-15 MED ORDER — SODIUM CHLORIDE 0.9 % IJ SOLN
3.0000 mL | Freq: Two times a day (BID) | INTRAMUSCULAR | Status: DC
  Administered 2013-05-15 – 2013-05-17 (×4): 3 mL via INTRAVENOUS

## 2013-05-15 MED ORDER — NITROGLYCERIN 0.4 MG SL SUBL: 0.4000 mg | SUBLINGUAL_TABLET | SUBLINGUAL | Status: DC | PRN

## 2013-05-15 NOTE — ED Notes (Signed)
Continues to deny any chest pain,  Or tightness.

## 2013-05-15 NOTE — ED Notes (Signed)
Chest "tightness" onset this am. Sent here from Dr Beatrice Lecher office  Alert, talking

## 2013-05-15 NOTE — ED Notes (Signed)
Attempted to call report. Nurse not available at this time.

## 2013-05-15 NOTE — ED Provider Notes (Signed)
CSN: [id number]     Arrival date & time 05/15/13  1414 History   First MD Initiated Contact with Patient 05/15/13 1434     Chief Complaint  Patient presents with  . Chest Pain    HPI Pt was seen at 1505.  Per pt, c/o gradual onset and persistence of constant mid-sternal chest "tightness" that began this morning. States the tightness worsens with exertion, improves with rest. Pt was evaluated by his PMD, then sent to the ED for further evaluation. Denies palpitations, no SOB/cough, no back pain, no abd pain, no N/V/D.    Past Medical History  Diagnosis Date  . Arteriosclerotic cardiovascular disease (ASCVD)     BMS to RCA 1997; cutting ballon for RCA restenosis in 2001; cath 12/10 - 100% RCA L->R collaterals; nl EF; 40% LAD and OM1  . Hypertension   . Hyperlipidemia   . Cerebrovascular disease     bilateral bruits; duplex in 2009 - mild plaque w/o stenosis  . Peripheral vascular disease     ABI on 0.87 on left  . Tobacco abuse, in remission     60 pack years; discontinued in 2003  . COPD (chronic obstructive pulmonary disease)   . Fasting hyperglycemia   . DDD (degenerative disc disease), cervical   . Muscle discomfort     No improvement after statins temporary discontinued  . Hypercholesteremia   . Lymphoma     chemotherapy and radiation therapy in 2006; recurrence in 2007  . Squamous cell carcinoma     s/p Mohs surgery  . Cancer     b cell lymphoma  . Diffuse large B cell lymphoma 03/18/2010   Past Surgical History  Procedure Laterality Date  . Anterior fusion cervical spine  2003    posterior?  . Inguinal hernia repair  1996    Right  . Orif femur fracture  1996  . Mohs surgery    . Colonoscopy  05/16/2011    Procedure: COLONOSCOPY;  Surgeon: Dalia Heading; diverticulosis, sigmoid polypectomy  . Port-a-cath removal Right 03/10/2013    Procedure: REMOVAL PORT-A-CATH;  Surgeon: Marlane Hatcher, MD;  Location: [facility];  Service: General;  Laterality: Right;   Family  History  Problem Relation Age of Onset  . Stroke Father     deceased - 74  . Stroke Mother     deceased - 49   . Heart attack Brother 56  . Heart attack Sister   . Colon cancer Neg Hx    History  Substance Use Topics  . Smoking status: Former Smoker -- 1.00 packs/day for 30 years  . Smokeless tobacco: Not on file  . Alcohol Use: No    Review of Systems ROS: Statement: All systems negative except as marked or noted in the HPI; Constitutional: Negative for fever and chills. ; ; Eyes: Negative for eye pain, redness and discharge. ; ; ENMT: Negative for ear pain, hoarseness, nasal congestion, sinus pressure and sore throat. ; ; Cardiovascular: +CP. Negative for palpitations, diaphoresis, dyspnea and peripheral edema. ; ; Respiratory: Negative for cough, wheezing and stridor. ; ; Gastrointestinal: Negative for nausea, vomiting, diarrhea, abdominal pain, blood in stool, hematemesis, jaundice and rectal bleeding. . ; ; Genitourinary: Negative for dysuria, flank pain and hematuria. ; ; Musculoskeletal: Negative for back pain and neck pain. Negative for swelling and trauma.; ; Skin: Negative for pruritus, rash, abrasions, blisters, bruising and skin lesion.; ; Neuro: Negative for headache, lightheadedness and neck stiffness. Negative for weakness, altered level of  consciousness , altered mental status, extremity weakness, paresthesias, involuntary movement, seizure and syncope.       Allergies  Review of patient's allergies indicates no known allergies.  Home Medications   Current Outpatient Rx  Name  Route  Sig  Dispense  Refill  . aspirin 81 MG EC tablet   Oral   Take 162 mg by mouth daily.          Marland Kitchen atorvastatin (LIPITOR) 40 MG tablet   Oral   Take 40 mg by mouth daily.         . furosemide (LASIX) 40 MG tablet   Oral   Take 1 tablet (40 mg total) by mouth daily.   30 tablet   6     Generic ZOX:WRUEA    40MG  Generic VWU:[id number]    66M .Marland Kitchen.   . lisinopril (PRINIVIL,ZESTRIL)  20 MG tablet   Oral   Take 1 tablet (20 mg total) by mouth daily.   90 tablet   0     Pt needs appointment for further refills.   Marland Kitchen omeprazole (PRILOSEC) 20 MG capsule   Oral   Take 20 mg by mouth daily.         Marland Kitchen EXPIRED: amLODipine (NORVASC) 5 MG tablet   Oral   Take 1 tablet (5 mg total) by mouth daily.   30 tablet   6   . potassium chloride (K-DUR,KLOR-CON) 10 MEQ tablet      TAKE 1 TABLET BY MOUTH DAILY. EMERGENCY REFILL FAXED DR   30 tablet   1     Generic For:K-DUR    CR Generic For:K-DUR    ...    BP 107/73  Pulse 49  Temp(Src) 98.9 F (37.2 C) (Oral)  Resp 21  Ht 5\' 8"  (1.727 m)  Wt 170 lb (77.111 kg)  BMI 25.85 kg/m2  SpO2 96% Physical Exam 1510: Physical examination:  Nursing notes reviewed; Vital signs and O2 SAT reviewed;  Constitutional: Well developed, Well nourished, Well hydrated, In no acute distress; Head:  Normocephalic, atraumatic; Eyes: EOMI, PERRL, No scleral icterus; ENMT: Mouth and pharynx normal, Mucous membranes moist; Neck: Supple, Full range of motion, No lymphadenopathy; Cardiovascular: Irregular irregular rate and rhythm, No gallop; Respiratory: Breath sounds coarse & equal bilaterally, No wheezes. Speaking full sentences with ease, Normal respiratory effort/excursion; Chest: Nontender, Movement normal; Abdomen: Soft, Nontender, Nondistended, Normal bowel sounds; Genitourinary: No CVA tenderness; Extremities: Pulses normal, No tenderness, No edema, No calf edema or asymmetry.; Neuro: AA&Ox3, Major CN grossly intact.  Speech clear. No gross focal motor or sensory deficits in extremities.; Skin: Color normal, Warm, Dry.   ED Course  Procedures   EKG Interpretation    Date/Time:  Thursday May 15 2013 14:22:34 EST Ventricular Rate:  98 PR Interval:    QRS Duration: 114 QT Interval:  374 QTC Calculation: 477 R Axis:   111 Text Interpretation:  Atrial fibrillation Non-specific intra-ventricular conduction delay Abnormal QRS-T  angle, consider primary T wave abnormality , consider non-standard lead placement Borderline Prolonged QT , may be secondary to QRS abnormality When compared with ECG of 22-Apr-2013 06:27, Atrial fibrillation is new    Confirmed by [facility]  MD, Nicholos Johns [phone]) on 05/15/2013 5:27:14 PM            MDM  MDM Reviewed: previous chart, nursing note and vitals Reviewed previous: labs and ECG Interpretation: labs, ECG and x-ray   Results for orders placed during the hospital encounter of 05/15/13  CBC  Result Value Range   WBC 9.1  4.0 - 10.5 K/uL   RBC 4.19 (*) 4.22 - 5.81 MIL/uL   Hemoglobin 13.7  13.0 - 17.0 g/dL   HCT 16.1  09.6 - 04.5 %   MCV 95.9  78.0 - 100.0 fL   MCH 32.7  26.0 - 34.0 pg   MCHC 34.1  30.0 - 36.0 g/dL   RDW 40.9  81.1 - 91.4 %   Platelets 276  150 - 400 K/uL  COMPREHENSIVE METABOLIC PANEL      Result Value Range   Sodium 136  135 - 145 mEq/L   Potassium 3.9  3.5 - 5.1 mEq/L   Chloride 98  96 - 112 mEq/L   CO2 24  19 - 32 mEq/L   Glucose, Bld 110 (*) 70 - 99 mg/dL   BUN 15  6 - 23 mg/dL   Creatinine, Ser 7.82  0.50 - 1.35 mg/dL   Calcium 9.1  8.4 - 95.6 mg/dL   Total Protein 8.1  6.0 - 8.3 g/dL   Albumin 3.7  3.5 - 5.2 g/dL   AST 21  0 - 37 U/L   ALT 16  0 - 53 U/L   Alkaline Phosphatase 93  39 - 117 U/L   Total Bilirubin 0.4  0.3 - 1.2 mg/dL   GFR calc non Af Amer 64 (*) >90 mL/min   GFR calc Af Amer 74 (*) >90 mL/min  TROPONIN I      Result Value Range   Troponin I <0.30  <0.30 ng/mL  PRO B NATRIURETIC PEPTIDE      Result Value Range   Pro B Natriuretic peptide (BNP) 1558.0 (*) 0 - 450 pg/mL   Dg Chest Portable 1 View 05/15/2013   CLINICAL DATA:  Chest pain.  EXAM: PORTABLE CHEST - 1 VIEW  COMPARISON:  CT chest 03/13/2013.  FINDINGS: Cardiomegaly with pulmonary vascular prominence and interstitial prominence noted consistent with congestive heart failure. No focal alveolar infiltrate. No pleural effusion or pneumothorax noted. Surgical clips  noted over both axilla.  IMPRESSION: Congestive heart failure with pulmonary interstitial edema.   Electronically Signed   By: Maisie Fus  Register   On: 05/15/2013 17:01    1735:  EKG with new onset afib; rate controlled between 60-90's on ED monitor. CHF on CXR, will dose IV lasix and check BNP. Pt given ASA but refused ntg SL because he "didn't have chest pain." Dx and testing d/w pt and family.  Questions answered.  Verb understanding, agreeable to admit.  T/C to [facility]. Ardyth Harps, case discussed, including:  HPI, pertinent PM/SHx, VS/PE, dx testing, ED course and treatment:  Agreeable to admit, requests to write temporary orders, obtain tele bed to team 2.      Laray Anger, DO 05/17/13 [phone]

## 2013-05-15 NOTE — H&P (Addendum)
[facility]          History and Physical    PCP:   Colette Ribas, MD   Chief Complaint:  Chest tightness  HPI: Patient is an 79 year old white man who is a very poor historian. His chart notes a past medical history significant for a large B cell lymphoma for which he has completed treatment, hypertension, history of coronary artery disease with a notation of a bare-metal stent to the RCA in 1997 with cutting balloon for RCA restenosis in 2001, hyperlipidemia, history of stroke, peripheral vascular disease. He went to see his PCP today with complaints of chest tightness. An EKG performed in the office shows what appears to be new onset atrial fibrillation with controlled rate in the 90s. Patient is unable to tell me whether he has had shortness of breath or describe his chest pain in detail although he does state that when he does some activities around the house  the chest tightness worsens. He has maintained  atrial fibrillation while in the ED. Chest x-ray shows some interstitial pulmonary edema. He is not known to have a history of congestive heart failure. We have been asked to admit him for further evaluation and management.  Allergies:  No Known Allergies    Past Medical History  Diagnosis Date  . Arteriosclerotic cardiovascular disease (ASCVD)     BMS to RCA 1997; cutting ballon for RCA restenosis in 2001; cath 12/10 - 100% RCA L->R collaterals; nl EF; 40% LAD and OM1  . Hypertension   . Hyperlipidemia   . Cerebrovascular disease     bilateral bruits; duplex in 2009 - mild plaque w/o stenosis  . Peripheral vascular disease     ABI on 0.87 on left  . Tobacco abuse, in remission     60 pack years; discontinued in 2003  . COPD (chronic obstructive pulmonary disease)   . Fasting hyperglycemia   . DDD (degenerative disc disease), cervical   . Muscle discomfort     No improvement after statins temporary discontinued  . Hypercholesteremia   . Lymphoma      chemotherapy and radiation therapy in 2006; recurrence in 2007  . Squamous cell carcinoma     s/p Mohs surgery  . Cancer     b cell lymphoma  . Diffuse large B cell lymphoma 03/18/2010    Past Surgical History  Procedure Laterality Date  . Anterior fusion cervical spine  2003    posterior?  . Inguinal hernia repair  1996    Right  . Orif femur fracture  1996  . Mohs surgery    . Colonoscopy  05/16/2011    Procedure: COLONOSCOPY;  Surgeon: Dalia Heading; diverticulosis, sigmoid polypectomy  . Port-a-cath removal Right 03/10/2013    Procedure: REMOVAL PORT-A-CATH;  Surgeon: Marlane Hatcher, MD;  Location: [facility];  Service: General;  Laterality: Right;    Prior to Admission medications   Medication Sig Start Date End Date Taking? Authorizing Provider  aspirin 81 MG EC tablet Take 162 mg by mouth daily.    Yes Historical Provider, MD  atorvastatin (LIPITOR) 40 MG tablet Take 40 mg by mouth daily.   Yes Historical Provider, MD  furosemide (LASIX) 40 MG tablet Take 1 tablet (40 mg total) by mouth daily. 10/09/12  Yes Kathlen Brunswick, MD  lisinopril (PRINIVIL,ZESTRIL) 20 MG tablet Take 1 tablet (20 mg total) by mouth daily. 02/04/13  Yes Kathlen Brunswick, MD  omeprazole (PRILOSEC) 20 MG capsule Take 20  mg by mouth daily. 02/11/13  Yes Historical Provider, MD  amLODipine (NORVASC) 5 MG tablet Take 1 tablet (5 mg total) by mouth daily. 12/23/10 12/23/11  Kathlen Brunswick, MD  potassium chloride (K-DUR,KLOR-CON) 10 MEQ tablet TAKE 1 TABLET BY MOUTH DAILY. EMERGENCY REFILL FAXED DR 05/12/13   Jodelle Gross, NP    Social History:  reports that he has quit smoking. He does not have any smokeless tobacco history on file. He reports that he does not drink alcohol or use illicit drugs.  Family History  Problem Relation Age of Onset  . Stroke Father     deceased - 5  . Stroke Mother     deceased - 16   . Heart attack Brother 56  . Heart attack Sister   . Colon cancer Neg Hx      Review of Systems:  Constitutional: Denies fever, chills, diaphoresis, appetite change and fatigue.  HEENT: Denies photophobia, eye pain, redness, hearing loss, ear pain, congestion, sore throat, rhinorrhea, sneezing, mouth sores, trouble swallowing, neck pain, neck stiffness and tinnitus.   Respiratory: Denies  cough, and wheezing.   Cardiovascular: Denies chest pain, palpitations and leg swelling.  Gastrointestinal: Denies nausea, vomiting, abdominal pain, diarrhea, constipation, blood in stool and abdominal distention.  Genitourinary: Denies dysuria, urgency, frequency, hematuria, flank pain and difficulty urinating.  Endocrine: Denies: hot or cold intolerance, sweats, changes in hair or nails, polyuria, polydipsia. Musculoskeletal: Denies myalgias, back pain, joint swelling, arthralgias and gait problem.  Skin: Denies pallor, rash and wound.  Neurological: Denies dizziness, seizures, syncope, weakness, light-headedness, numbness and headaches.  Hematological: Denies adenopathy. Easy bruising, personal or family bleeding history  Psychiatric/Behavioral: Denies suicidal ideation, mood changes, confusion, nervousness, sleep disturbance and agitation   Physical Exam: Blood pressure 139/78, pulse 100, temperature 98.9 F (37.2 C), temperature source Oral, resp. rate 19, height 5\' 8"  (1.727 m), weight 77.111 kg (170 lb), SpO2 95.00%. General: Alert, awake, oriented, seems a bit confused in regards to past events. HEENT: Normocephalic, atraumatic, pupils equal and reactive to light, wears corrective lenses. Neck: Supple, no JVD, no lymphadenopathy, no bruits, no goiter. Cardiovascular: Irregular rhythm, no murmurs rubs or gallops I can't auscultate. Lungs: Bibasilar crackles, no rhonchi or wheezing. Abdomen: Soft, nontender, nondistended, positive bowel sounds. Extremities: 1+ pitting edema bilaterally. Neurologic: Grossly intact and nonfocal  Labs on Admission:  Results for orders  placed during the hospital encounter of 05/15/13 (from the past 48 hour(s))  CBC     Status: Abnormal   Collection Time    05/15/13  2:15 PM      Result Value Range   WBC 9.1  4.0 - 10.5 K/uL   RBC 4.19 (*) 4.22 - 5.81 MIL/uL   Hemoglobin 13.7  13.0 - 17.0 g/dL   HCT 16.1  09.6 - 04.5 %   MCV 95.9  78.0 - 100.0 fL   MCH 32.7  26.0 - 34.0 pg   MCHC 34.1  30.0 - 36.0 g/dL   RDW 40.9  81.1 - 91.4 %   Platelets 276  150 - 400 K/uL  COMPREHENSIVE METABOLIC PANEL     Status: Abnormal   Collection Time    05/15/13  2:15 PM      Result Value Range   Sodium 136  135 - 145 mEq/L   Potassium 3.9  3.5 - 5.1 mEq/L   Chloride 98  96 - 112 mEq/L   CO2 24  19 - 32 mEq/L   Glucose, Bld 110 (*) 70 -  99 mg/dL   BUN 15  6 - 23 mg/dL   Creatinine, Ser 9.60  0.50 - 1.35 mg/dL   Calcium 9.1  8.4 - 45.4 mg/dL   Total Protein 8.1  6.0 - 8.3 g/dL   Albumin 3.7  3.5 - 5.2 g/dL   AST 21  0 - 37 U/L   ALT 16  0 - 53 U/L   Alkaline Phosphatase 93  39 - 117 U/L   Total Bilirubin 0.4  0.3 - 1.2 mg/dL   GFR calc non Af Amer 64 (*) >90 mL/min   GFR calc Af Amer 74 (*) >90 mL/min   Comment: (NOTE)     The eGFR has been calculated using the CKD EPI equation.     This calculation has not been validated in all clinical situations.     eGFR's persistently <90 mL/min signify possible Chronic Kidney     Disease.  TROPONIN I     Status: None   Collection Time    05/15/13  2:15 PM      Result Value Range   Troponin I <0.30  <0.30 ng/mL   Comment:            Due to the release kinetics of cTnI,     a negative result within the first hours     of the onset of symptoms does not rule out     myocardial infarction with certainty.     If myocardial infarction is still suspected,     repeat the test at appropriate intervals.    Radiological Exams on Admission: Dg Chest Portable 1 View  05/15/2013   CLINICAL DATA:  Chest pain.  EXAM: PORTABLE CHEST - 1 VIEW  COMPARISON:  CT chest 03/13/2013.  FINDINGS:  Cardiomegaly with pulmonary vascular prominence and interstitial prominence noted consistent with congestive heart failure. No focal alveolar infiltrate. No pleural effusion or pneumothorax noted. Surgical clips noted over both axilla.  IMPRESSION: Congestive heart failure with pulmonary interstitial edema.   Electronically Signed   By: Maisie Fus  Register   On: 05/15/2013 17:01    Assessment/Plan Principal Problem:   Chest tightness Active Problems:   New onset atrial fibrillation   Pulmonary edema   Diffuse large B cell lymphoma   HYPERTENSION   Peripheral vascular disease   Chest pain   Chest tightness -Presumably related to new onset A. fib. He denies palpitations. -Admit to telemetry cycle troponins, repeat EKG in the morning. -He does have a history of coronary artery disease, determination for further cardiac workup as clinical course evolves.  New-onset atrial fibrillation -Currently rate controlled. -Given his CHADS score he qualifies for anticoagulation. -We'll need to have further discussions as to whether to start warfarin or a  novel anticoagulant; for now will start him on aspirin tonight. -Will order 2-D echo to rule out any structural heart abnormalities. -Cardiology consult has been requested for further evaluation.  Pulmonary edema -Not known to have a history of congestive heart failure. -2-D echo should clarify the issue of CHF. -Suspect related to new onset atrial fibrillation. -We'll order Lasix 40 mg IV daily with strict intake and output and daily weights.  Hypertension -Given new onset atrial fibrillation, we'll start him on metoprolol which should help some with rate control as well.  DVT prophylaxis -Lovenox.  CODE STATUS -Full code   Time Spent on Admission: 75 minutes  HERNANDEZ ACOSTA,ESTELA [facility] Pager: [phone] 05/15/2013, 5:56 PM

## 2013-05-16 DIAGNOSIS — J811 Chronic pulmonary edema: Secondary | ICD-10-CM

## 2013-05-16 DIAGNOSIS — I359 Nonrheumatic aortic valve disorder, unspecified: Secondary | ICD-10-CM

## 2013-05-16 DIAGNOSIS — I4891 Unspecified atrial fibrillation: Secondary | ICD-10-CM

## 2013-05-16 LAB — CBC
HCT: 38 % — ABNORMAL LOW (ref 39.0–52.0)
MCV: 95.7 fL (ref 78.0–100.0)
Platelets: 254 10*3/uL (ref 150–400)
RBC: 3.97 MIL/uL — ABNORMAL LOW (ref 4.22–5.81)
RDW: 14.1 % (ref 11.5–15.5)
WBC: 8.5 10*3/uL (ref 4.0–10.5)

## 2013-05-16 LAB — BASIC METABOLIC PANEL
CO2: 27 mEq/L (ref 19–32)
Calcium: 9 mg/dL (ref 8.4–10.5)
Chloride: 100 mEq/L (ref 96–112)
Creatinine, Ser: 1.14 mg/dL (ref 0.50–1.35)
GFR calc Af Amer: 68 mL/min — ABNORMAL LOW (ref >90)
Potassium: 3.8 mEq/L (ref 3.5–5.1)
Sodium: 138 mEq/L (ref 135–145)

## 2013-05-16 LAB — TROPONIN I: Troponin I: <0.3 ng/mL (ref <0.30)

## 2013-05-16 LAB — HEMOGLOBIN A1C: Hgb A1c MFr Bld: 6.9 % — ABNORMAL HIGH (ref <5.7)

## 2013-05-16 MED ORDER — METOPROLOL TARTRATE 25 MG PO TABS
25.0000 mg | ORAL_TABLET | Freq: Two times a day (BID) | ORAL | Status: AC
  Administered 2013-05-16 (×2): 25 mg via ORAL
  Filled 2013-05-16 (×2): qty 1

## 2013-05-16 MED ORDER — METOPROLOL SUCCINATE ER 50 MG PO TB24
50.0000 mg | ORAL_TABLET | Freq: Every day | ORAL | Status: DC
  Administered 2013-05-17: 50 mg via ORAL
  Filled 2013-05-16: qty 1

## 2013-05-16 MED ORDER — ASPIRIN EC 325 MG PO TBEC
325.0000 mg | DELAYED_RELEASE_TABLET | Freq: Every day | ORAL | Status: DC
  Administered 2013-05-17: 325 mg via ORAL
  Filled 2013-05-16: qty 1

## 2013-05-16 NOTE — Progress Notes (Signed)
UR chart review completed.  

## 2013-05-16 NOTE — Care Management Note (Signed)
    Page 1 of 1   05/16/2013     11:41:55 AM   CARE MANAGEMENT NOTE 05/16/2013  Patient:  Curtis Lang, Curtis Lang   Account Number:  [id number]  Date Initiated:  05/16/2013  Documentation initiated by:  Sharrie Rothman  Subjective/Objective Assessment:   Pt admitted from home with a fib and CHF. Pt lives alone and will return home at discharge. Pt is independent with ADL's.     Action/Plan:   No CM needs noted.   Anticipated DC Date:  05/19/2013   Anticipated DC Plan:  HOME/SELF CARE      DC Planning Services  CM consult      Choice offered to / List presented to:             Status of service:  Completed, signed off Medicare Important Message given?   (If response is "NO", the following Medicare IM given date fields will be blank) Date Medicare IM given:   Date Additional Medicare IM given:    Discharge Disposition:  HOME/SELF CARE  Per UR Regulation:    If discussed at Long Length of Stay Meetings, dates discussed:    Comments:  05/16/13 1140 Arlyss Queen, RN BSN CM

## 2013-05-16 NOTE — Progress Notes (Signed)
05/16/13 1755 Patient up to chair and ambulates in room. Tolerates activity well. Instructed to call for assistance as needed. Pt sitting up in chair at this time. Family at bedside, call light within reach. Earnstine Regal, RN

## 2013-05-16 NOTE — Progress Notes (Signed)
[facility] PROGRESS NOTE  Curtis Lang ZOX:[id number] DOB: 1930-12-24 DOA: 05/15/2013 PCP: Colette Ribas, MD  Assessment/Plan: Chest pain -Resolved, although his history was very vague as he is quite a poor historian. -He does have a history of coronary artery disease. -Has been assessed by cardiology and plan is to will write results of 2-D echocardiogram; but evidence of decreased systolic function or new wall motion abnormality may require further evaluation. -Continue aspirin, statin, lisinopril, also newly started on beta blocker in the setting of possible ischemic heart disease and atrial fibrillation.  Atrial fibrillation -This is a new diagnosis for the patient. -He was initially rate controlled, rates increased to the 120s overnight and has decreased into the 100s after initiation of metoprolol. His CHADS Vasc is at least 6 and as such would benefit from anticoagulation. However, cardiology is hesitant to start anticoagulation without knowing more about the status of his B-cell lymphoma. All I know from chart review is that it appears he finished treatment in 2007 so I do not see how this should be any contraindication to anticoagulation. For now he remains on aspirin.  Hypertension -Well controlled at present. -Continue current regimen.  Pulmonary edema -Not known to have congestive heart failure. -Suspect related to new onset atrial fibrillation. -2-D echocardiogram is currently pending.  Code Status: Full code Family Communication: Patient only  Disposition Plan: Home in medically ready   Consultants:   cardiology   Antibiotics:  None   Subjective: No complaints.  Objective: Filed Vitals:   05/16/13 0500 05/16/13 0929 05/16/13 1021 05/16/13 1440  BP: 99/62 111/63 122/59 96/66  Pulse: 113 92 102 102  Temp: 98.2 F (36.8 C)  97.7 F (36.5 C) 97.9 F (36.6 C)  TempSrc: Oral  Oral Oral  Resp: 20 20 20 20   Height:      Weight: 72.3 kg (159  lb 6.3 oz)     SpO2: 97% 98% 98% 93%    Intake/Output Summary (Last 24 hours) at 05/16/13 1445 Last data filed at 05/16/13 1300  Gross per 24 hour  Intake    486 ml  Output      0 ml  Net    486 ml   Filed Weights   05/15/13 1418 05/15/13 2220 05/16/13 0500  Weight: 77.111 kg (170 lb) 75.1 kg (165 lb 9.1 oz) 72.3 kg (159 lb 6.3 oz)    Exam:   General:  Alert, awake, in no distress  Cardiovascular: Irregular, no murmurs, rubs or gallops  Respiratory: Mild by basilar crackles  Abdomen: Soft, nontender, nondistended, positive bowel sounds  Extremities: No clubbing, cyanosis or edema, positive pedal pulses   Neurologic:  Nonfocal  Data Reviewed: Basic Metabolic Panel:  Recent Labs Lab 05/15/13 1415 05/16/13 0212  NA 136 138  K 3.9 3.8  CL 98 100  CO2 24 27  GLUCOSE 110* 118*  BUN 15 14  CREATININE 1.06 1.14  CALCIUM 9.1 9.0   Liver Function Tests:  Recent Labs Lab 05/15/13 1415  AST 21  ALT 16  ALKPHOS 93  BILITOT 0.4  PROT 8.1  ALBUMIN 3.7   No results found for this basename: LIPASE, AMYLASE,  in the last 168 hours No results found for this basename: AMMONIA,  in the last 168 hours CBC:  Recent Labs Lab 05/15/13 1415 05/16/13 0212  WBC 9.1 8.5  HGB 13.7 12.8*  HCT 40.2 38.0*  MCV 95.9 95.7  PLT 276 254   Cardiac Enzymes:  Recent Labs Lab 05/15/13  1415 05/15/13 2042 05/16/13 0212 05/16/13 0801  TROPONINI <0.30 <0.30 <0.30 <0.30   BNP (last 3 results)  Recent Labs  05/15/13 1415 05/15/13 2042  PROBNP 1558.0* 1687.0*   CBG: No results found for this basename: GLUCAP,  in the last 168 hours  No results found for this or any previous visit (from the past 240 hour(s)).   Studies: Dg Chest Portable 1 View  05/15/2013   CLINICAL DATA:  Chest pain.  EXAM: PORTABLE CHEST - 1 VIEW  COMPARISON:  CT chest 03/13/2013.  FINDINGS: Cardiomegaly with pulmonary vascular prominence and interstitial prominence noted consistent with congestive  heart failure. No focal alveolar infiltrate. No pleural effusion or pneumothorax noted. Surgical clips noted over both axilla.  IMPRESSION: Congestive heart failure with pulmonary interstitial edema.   Electronically Signed   By: Maisie Fus  Register   On: 05/15/2013 17:01    Scheduled Meds: . Melene Muller ON 05/17/2013] aspirin EC  325 mg Oral Daily  . atorvastatin  40 mg Oral q1800  . enoxaparin (LOVENOX) injection  40 mg Subcutaneous Q24H  . furosemide  40 mg Intravenous Daily  . influenza vac split quadrivalent PF  0.5 mL Intramuscular Tomorrow-1000  . lisinopril  20 mg Oral Daily  . metoprolol tartrate  25 mg Oral BID  . pantoprazole  40 mg Oral Daily  . sodium chloride  3 mL Intravenous Q12H  . sodium chloride  3 mL Intravenous Q12H   Continuous Infusions:   Principal Problem:   Chest tightness Active Problems:   New onset atrial fibrillation   Pulmonary edema   Diffuse large B cell lymphoma   HYPERTENSION   Peripheral vascular disease   Chest pain   A-fib    Time spent: 35 minutes. Greater than 50% of this time was spent in direct contact with the patient coordinating care.    Chaya Jan  [facility] Pager [phone]  If 7PM-7AM, please contact night-coverage at [email or web address], password [facility] 05/16/2013, 2:45 PM  LOS: 1 day

## 2013-05-16 NOTE — Progress Notes (Signed)
*  PRELIMINARY RESULTS* Echocardiogram 2D Echocardiogram has been performed.  Curtis Lang 05/16/2013, 3:18 PM

## 2013-05-16 NOTE — Progress Notes (Signed)
Echo read, shows significant LV dysfunction that is new for the patient. I have stopped his lopressor after tonights dose and will start Toprol XL tomorrow morning. We will continue his lisinopril at the current dose, and I will further titrate his meds as an outpatient. His EKG has no acute changes and negative troponins, I cannot assess the quality of his symptoms because he is a very poor historian. From my view he does require an ischemic evaluation due to his systolic dysfunction but does not have an indication for it to be done as an inpatient, can arrange at follow up. His oncologist notified me that from a cancer standpoint he is ok to start anticoagulation, I can also discuss this with him in detail at follow. If discharged over weekend please set him up to see me in either [place] or [place] in 2-3 weeks. May titrate Toprol XL further if needed for rate control of afib.    Dina Rich MD

## 2013-05-16 NOTE — Consult Note (Addendum)
Primary cardiologist: Former Patient Dr Dietrich Pates Consulting cardiologist: Dr. Antoine Poche  Clinical Summary Curtis Lang is a 79 y.o.male admitted with chest pain. He has a history of CAD with prior PCI to RCA in 1997 with cutting balloon to RCA in 2001, last cath 05/2009 with chronically occluded RCA with left to right collaterals and moderate disease of LAD and LCX. LV gram showed inferior wall hypokinesis and LVEF 55%.   Patient is a poor historian. He is fully alert and oriented but cannot recall any specifics of the symptoms that he has been having. In general he reports yesterday he just was not feeling right. Cannot elicit specifically if he has been having any chest pain. He was seen by his PCP who performed an EKG and the patient was found to be in afib which is a new diagnosis for him. He is not able to recall if he has been having any episodes of palpitations or SOB.  Troponins negative x 4, EKG afib old incomplete LBBB. CXR showed pulmonary edema, pro-BNP of 1687. Echo is pending.   No Known Allergies  Medications Scheduled Medications: . aspirin EC  81 mg Oral Daily  . atorvastatin  40 mg Oral q1800  . enoxaparin (LOVENOX) injection  40 mg Subcutaneous Q24H  . furosemide  40 mg Intravenous Daily  . influenza vac split quadrivalent PF  0.5 mL Intramuscular Tomorrow-1000  . lisinopril  20 mg Oral Daily  . pantoprazole  40 mg Oral Daily  . sodium chloride  3 mL Intravenous Q12H  . sodium chloride  3 mL Intravenous Q12H     Infusions:     PRN Medications:  sodium chloride, acetaminophen, acetaminophen, metoprolol, ondansetron (ZOFRAN) IV, ondansetron, oxyCODONE, senna-docusate, sodium chloride   Past Medical History  Diagnosis Date  . Arteriosclerotic cardiovascular disease (ASCVD)     BMS to RCA 1997; cutting ballon for RCA restenosis in 2001; cath 12/10 - 100% RCA L->R collaterals; nl EF; 40% LAD and OM1  . Hypertension   . Hyperlipidemia   .  Cerebrovascular disease     bilateral bruits; duplex in 2009 - mild plaque w/o stenosis  . Peripheral vascular disease     ABI on 0.87 on left  . Tobacco abuse, in remission     60 pack years; discontinued in 2003  . COPD (chronic obstructive pulmonary disease)   . Fasting hyperglycemia   . DDD (degenerative disc disease), cervical   . Muscle discomfort     No improvement after statins temporary discontinued  . Hypercholesteremia   . Lymphoma     chemotherapy and radiation therapy in 2006; recurrence in 2007  . Squamous cell carcinoma     s/p Mohs surgery  . Cancer     b cell lymphoma  . Diffuse large B cell lymphoma 03/18/2010    Past Surgical History  Procedure Laterality Date  . Anterior fusion cervical spine  2003    posterior?  . Inguinal hernia repair  1996    Right  . Orif femur fracture  1996  . Mohs surgery    . Colonoscopy  05/16/2011    Procedure: COLONOSCOPY;  Surgeon: Dalia Heading; diverticulosis, sigmoid polypectomy  . Port-a-cath removal Right 03/10/2013    Procedure: REMOVAL PORT-A-CATH;  Surgeon: Marlane Hatcher, MD;  Location: [facility];  Service: General;  Laterality: Right;    Family History  Problem Relation Age of Onset  . Stroke Father     deceased - 42  .  Stroke Mother     deceased - 71   . Heart attack Brother 56  . Heart attack Sister   . Colon cancer Neg Hx     Social History Mr. Huberty reports that he has quit smoking. He does not have any smokeless tobacco history on file. Mr. Peeples reports that he does not drink alcohol.  Review of Systems CONSTITUTIONAL: No weight loss, fever, chills, weakness or fatigue.  HEENT: Eyes: No visual loss, blurred vision, double vision or yellow sclerae. No hearing loss, sneezing, congestion, runny nose or sore throat.  SKIN: No rash or itching.  CARDIOVASCULAR: patient does not recall any symptoms.  RESPIRATORY:patient does not recall any symptoms GASTROINTESTINAL: No anorexia, nausea, vomiting or  diarrhea. No abdominal pain or blood.  GENITOURINARY: no polyuria, no dysuria NEUROLOGICAL: memory deficit MUSCULOSKELETAL: No muscle, back pain, joint pain or stiffness.  HEMATOLOGIC: No anemia, bleeding or bruising.  LYMPHATICS: No enlarged nodes. No history of splenectomy.  PSYCHIATRIC: No history of depression or anxiety.      Physical Examination Blood pressure 111/63, pulse 92, temperature 98.2 F (36.8 C), temperature source Oral, resp. rate 20, height 5\' 6"  (1.676 m), weight 159 lb 6.3 oz (72.3 kg), SpO2 98.00%.  Intake/Output Summary (Last 24 hours) at 05/16/13 1009 Last data filed at 05/16/13 0936  Gross per 24 hour  Intake    246 ml  Output      0 ml  Net    246 ml    Cardiovascular: irreg, no m/r/g, no JVD  Respiratory: CTAB  GI: soft, NT, nD  MSK: extremities warm, no edema  Neuro: A&Ox3, difficutly recalling symptoms, no focal defcitis  Psych: appropriate affect   Lab Results  Basic Metabolic Panel:  Recent Labs Lab 05/15/13 1415 05/16/13 0212  NA 136 138  K 3.9 3.8  CL 98 100  CO2 24 27  GLUCOSE 110* 118*  BUN 15 14  CREATININE 1.06 1.14  CALCIUM 9.1 9.0    Liver Function Tests:  Recent Labs Lab 05/15/13 1415  AST 21  ALT 16  ALKPHOS 93  BILITOT 0.4  PROT 8.1  ALBUMIN 3.7    CBC:  Recent Labs Lab 05/15/13 1415 05/16/13 0212  WBC 9.1 8.5  HGB 13.7 12.8*  HCT 40.2 38.0*  MCV 95.9 95.7  PLT 276 254    Cardiac Enzymes:  Recent Labs Lab 05/15/13 1415 05/15/13 2042 05/16/13 0212 05/16/13 0801  TROPONINI <0.30 <0.30 <0.30 <0.30    BNP: No components found with this basename: POCBNP,    ECG: afib, incomplete LBBB   Diagnostic Studies 05/2009 Cath HEMODYNAMIC FINDINGS:  Central aortic pressure 153/61.  Left ventricular pressure 160/14.  Left ventricular end-diastolic pressure 22.  ANGIOGRAPHIC FINDINGS: 1. The left main coronary artery had a 20% mid stenosis. 2. The left anterior descending was a large  vessel that coursed to the     apex and had a long tubular 30% stenosis throughout the proximal     portion with moderate calcification.  There appeared to be a     discrete 40% stenosis in the midportion of this vessel.  The distal     LAD had a discrete 65% stenosis in a portion of the vessel where     the vessel had become relatively small, approximately 1.5 mm).     First diagonal was small with mild plaque disease. 3. The circumflex artery gave off an early moderate-sized obtuse     marginal Choice Kleinsasser that had a 40% stenosis.  The second obtuse     marginal was small in caliber with mild plaque disease.  The AV     groove circumflex portion was small in caliber with no disease. 4. The right coronary artery is a large dominant vessel that has     serial 50% lesions throughout the proximal portion and then 100%     total occlusion in the midportion of the vessel presumably at the     side of the prior stent.  The distal right coronary artery, PDA,     and posterolateral Thedora Rings fills from left-to-right collaterals. 5. Left ventricular angiogram was performed in the RAO projection and     showed mild inferior wall hypokinesis with ejection fraction of     55%.  IMPRESSION: 1. Single-vessel coronary artery disease. 2. Moderate nonobstructive disease in the left anterior descending     artery and in the circumflex system. 3. Normal left ventricular systolic function.  RECOMMENDATIONS:  This patient has a total occlusion of the mid right coronary artery; however, there are great collaterals to the distal vessel.  There is just moderate plaque disease throughout the other vessels.  I do not see any lesions that are amenable  to percutaneous coronary intervention at this time.  We will continue medical management.  The patient will be scheduled for followup with the [facility] office in 2-3 weeks.  He has previously been seen by  Joni Reining, NP.  05/15/13  CXR FINDINGS:  Cardiomegaly with pulmonary vascular prominence and interstitial  prominence noted consistent with congestive heart failure. No focal  alveolar infiltrate. No pleural effusion or pneumothorax noted.  Surgical clips noted over both axilla.  IMPRESSION:  Congestive heart failure with pulmonary interstitial edema.   Impression/ Recommendations  1. Chest pain - this is the admitting diagnosis however on my history taking patient is not able to recall or relay any history of symptoms. He is fully alert and is independent and takes care of his own home, but has extreme difficulty recalling past events. - he has a history of CAD with prior interventions, last cath showed occluded RCA with left to right collaterals and moderate disease of the LAD and LCX. There is no evidence of ACS, cannot assess symptoms by history.  - follow up echo results, if evidence of decreased systolic function or new wall motion abnormality may be suggestive of advanced CAD requiring further evaluation. - continue current medical therapy including ASA, statin, lisinopril. Start beta blocker in setting of possible ischemic heart disease and also afib. F/u echo  2. Afib - new diagnosis for the patient, currently with elevated rates. Unclear if this could be making him feel "not right" - will start metoprolol 25mg  bid, his blood pressure has been low normal at times so will start conservatively. If there is evidence of systolic dysfunction by echo will change to Toprol XL - his CHADS2Vasc score is at least 6 (possibly 7 if evidence of CHF) putting him at very high risk for stroke. I am hesitant to start anticoag without knowing more about his cancer status (B cell lymphoma). He does seem to have some form of dementia, but is independent without any significant fall history, so that would not be a contraindication. I'm not sure he would comprehend the risks and benefits of anticoagulation based on his likely  dementia either. Will send correspondence to his heme/onc physician to get there thoughts of starting anticoagulation, if not started here as inpatient can readdress as outpatient  when he follows up with [place]. I have sent a correspondence to his oncologist Dr Mariel Sleet for thoughts about starting anticoagulation in this patient. Continue ASA for now.   Dina Rich, M.D., F.A.C.C.

## 2013-05-17 DIAGNOSIS — I5022 Chronic systolic (congestive) heart failure: Secondary | ICD-10-CM

## 2013-05-17 MED ORDER — ASPIRIN 325 MG PO TBEC: 325.0000 mg | DELAYED_RELEASE_TABLET | Freq: Every day | ORAL | Status: DC

## 2013-05-17 MED ORDER — METOPROLOL SUCCINATE ER 50 MG PO TB24: 50.0000 mg | ORAL_TABLET | Freq: Every day | ORAL | Status: DC

## 2013-05-17 NOTE — Discharge Summary (Signed)
Physician Discharge Summary  Curtis Lang ZOX:[id number] DOB: Jul 03, 1930 DOA: 05/15/2013  PCP: Colette Ribas, MD  Admit date: 05/15/2013 Discharge date: 05/17/2013  Time spent: 45 minutes  Recommendations for Outpatient Follow-up:  -Will be discharged home today. -Will require followup with Dr. Wyline Mood, cardiology in 2-3 weeks per his recommendation to discuss further ischemic cardiac workup as well as anticoagulation for his new onset atrial fibrillation. (Have left message with the office to schedule this followup appointment).   Discharge Diagnoses:  Principal Problem:   Chest tightness Active Problems:   New onset atrial fibrillation   Pulmonary edema   Diffuse large B cell lymphoma   HYPERTENSION   Peripheral vascular disease   Chest pain   A-fib   Chronic systolic CHF (congestive heart failure)   Discharge Condition: Stable and improved  Filed Weights   05/15/13 2220 05/16/13 0500 05/17/13 0500  Weight: 75.1 kg (165 lb 9.1 oz) 72.3 kg (159 lb 6.3 oz) 73.6 kg (162 lb 4.1 oz)    History of present illness:  Patient is an 79 year old white man who is a very poor historian. His chart notes a past medical history significant for a large B cell lymphoma for which he has completed treatment, hypertension, history of coronary artery disease with a notation of a bare-metal stent to the RCA in 1997 with cutting balloon for RCA restenosis in 2001, hyperlipidemia, history of stroke, peripheral vascular disease. He went to see his PCP today with complaints of chest tightness. An EKG performed in the office shows what appears to be new onset atrial fibrillation with controlled rate in the 90s. Patient is unable to tell me whether he has had shortness of breath or describe his chest pain in detail although he does state that when he does some activities around the house the chest tightness worsens. He has maintained atrial fibrillation while in the ED. Chest x-ray shows some  interstitial pulmonary edema. He is not known to have a history of congestive heart failure. We were asked to admit him for further evaluation and management.   Hospital Course:   Chest pain -Resolved. -2-D echocardiogram shows an ejection fraction of 25-30% which is new. -Continue aspirin, statin, ACE inhibitor, beta blocker. -Echo has been reviewed by Dr. Wyline Mood, with recommendations to followup with him in the outpatient setting in 2-3 weeks for further ischemic cardiac evaluation.  New onset atrial fibrillation -Has been rate controlled. -Per CHADS criteria he should be on anticoagulation. -For now will be discharged on aspirin 325 mg daily; at followup appointment with Dr. Wyline Mood will need to have conversation about anticoagulation and whether to start Coumadin versus a novel anticoagulant.  Chronic systolic CHF -New diagnosis. -Had some mild interstitial edema on admission, however does not appear markedly volume overloaded. -2-D echocardiogram consistent with ejection fraction of 25-30%.  Hypertension -Well controlled. -Continue current regimen consistent of ACE inhibitor and beta blocker.  Procedures:  None   Consultations:  Cardiology  Discharge Instructions      Discharge Orders   Future Appointments Provider Department Dept Phone   03/16/2014 9:00 AM [facility] [phone]   03/19/2014 9:30 AM [facility] [phone]   Future Orders Complete By Expires   Diet - low sodium heart healthy  As directed    Discontinue IV  As directed    Increase activity slowly  As directed        Medication List    STOP taking  these medications       amLODipine 5 MG tablet  Commonly known as:  NORVASC      TAKE these medications       aspirin 325 MG EC tablet  Take 1 tablet (325 mg total) by mouth daily.     atorvastatin 40 MG tablet  Commonly known as:  LIPITOR  Take 40 mg by mouth daily.      furosemide 40 MG tablet  Commonly known as:  LASIX  Take 1 tablet (40 mg total) by mouth daily.     lisinopril 20 MG tablet  Commonly known as:  PRINIVIL,ZESTRIL  Take 1 tablet (20 mg total) by mouth daily.     metoprolol succinate 50 MG 24 hr tablet  Commonly known as:  TOPROL-XL  Take 1 tablet (50 mg total) by mouth daily. Take with or immediately following a meal.     omeprazole 20 MG capsule  Commonly known as:  PRILOSEC  Take 20 mg by mouth daily.     potassium chloride 10 MEQ tablet  Commonly known as:  K-DUR,KLOR-CON  TAKE 1 TABLET BY MOUTH DAILY. EMERGENCY REFILL FAXED DR       No Known Allergies Follow-up Information   Follow up with Dina Rich, F, MD. Schedule an appointment as soon as possible for a visit in 2 weeks. (office will call your nephew with appointment date and time)    Specialty:  Cardiology   Contact information:   [address] [place] [address] [phone]       Follow up with Colette Ribas, MD In 1 week.   Specialty:  Family Medicine   Contact information:   [address] [place] [address] [phone]        The results of significant diagnostics from this hospitalization (including imaging, microbiology, ancillary and laboratory) are listed below for reference.    Significant Diagnostic Studies: Ct Head Wo Contrast  04/22/2013   CLINICAL DATA:  Headache. History of lymphoma.  EXAM: CT HEAD WITHOUT CONTRAST  TECHNIQUE: Contiguous axial images were obtained from the base of the skull through the vertex without intravenous contrast.  COMPARISON:  None.  FINDINGS: There is cortical atrophy and chronic microvascular ischemic change. Mega cisterna magna is incidentally noted. No evidence of acute abnormality including infarction, hemorrhage, mass lesion, mass effect, midline shift or abnormal extra-axial fluid collection is identified. There is no hydrocephalus or pneumocephalus. The  patient is status post bilateral lens extraction. The calvarium is intact.  IMPRESSION: No acute finding.  Atrophy and chronic microvascular ischemic change.   Electronically Signed   By: Drusilla Kanner M.D.   On: 04/22/2013 07:10   Mr Cervical Spine Wo Contrast  04/17/2013   CLINICAL DATA:  Patient complains of chronic posterior neck pain without a known recent injury and a prior history of cervical surgery.  EXAM: MRI CERVICAL SPINE WITHOUT CONTRAST  TECHNIQUE: Multiplanar, multisequence MR imaging was performed. No intravenous contrast was administered.  COMPARISON:  Cervical spine series 01/02/2012.  FINDINGS: There are hypertrophic changes along the posterior aspect of the odontoid process most prominent along the right paracentral aspect with areas flattening of the upper cervical spinal cord. There is also a more focal hypertrophic area more cephalad near the tip of the clivus which protrudes into the posterior fossa with mild mass effect upon the medulla. There is a small focal area of T2 hyperintensity within the cervical spinal cord along the  right side at the level of C4 without cord expansion likely representing an area of myelomalacia. Vertebral body heights are maintained.  There is anterior cervical disc fusion from C3 through C6 with osseous bridging. The intervening disk spaces. The cervical spine is normal in lordotic alignment. No static listhesis. Bone marrow signal is normal. Cerebellar tonsils are normal in position.  C2-3: No significant disc bulge. No neural foraminal stenosis. No central canal stenosis.  C3-4: No significant disc bulge. Moderate bilateral foraminal stenosis. No central canal stenosis.  C4-5: No significant disc bulge. Bilateral uncovertebral degenerative changes. Severe right foraminal stenosis. Mild left foraminal stenosis. No central canal stenosis.  C5-6: Bilateral uncovertebral degenerative changes and severe right facet arthropathy. Severe right foraminal stenosis.  Moderate left foraminal stenosis. No central canal stenosis.  C6-7: Mild broad-based disk bulge. Bilateral uncovertebral degenerative changes. . Severe right and moderate left facet arthropathy. Severe right foraminal stenosis. Mild left foraminal stenosis. No central canal stenosis.  C7-T1 No significant disc bulge. No neural foraminal stenosis. No central canal stenosis. Severe left facet arthropathy.  IMPRESSION: 1.  ACDF from C3 through C6 as described above.  2.  Cervical spondylosis as described above.  3. There are hypertrophic changes along the posterior aspect of the odontoid process most prominent along the right paracentral aspect with areas flattening of the upper cervical spinal cord. There is also a focal hypertrophic area more cephalad near the tip of the clivus which protrudes into the posterior fossa with mild mass effect upon the medulla.   Electronically Signed   By: Elige Ko   On: 04/17/2013 15:39   Dg Chest Portable 1 View  05/15/2013   CLINICAL DATA:  Chest pain.  EXAM: PORTABLE CHEST - 1 VIEW  COMPARISON:  CT chest 03/13/2013.  FINDINGS: Cardiomegaly with pulmonary vascular prominence and interstitial prominence noted consistent with congestive heart failure. No focal alveolar infiltrate. No pleural effusion or pneumothorax noted. Surgical clips noted over both axilla.  IMPRESSION: Congestive heart failure with pulmonary interstitial edema.   Electronically Signed   By: Maisie Fus  Register   On: 05/15/2013 17:01    Microbiology: No results found for this or any previous visit (from the past 240 hour(s)).   Labs: Basic Metabolic Panel:  Recent Labs Lab 05/15/13 1415 05/16/13 0212  NA 136 138  K 3.9 3.8  CL 98 100  CO2 24 27  GLUCOSE 110* 118*  BUN 15 14  CREATININE 1.06 1.14  CALCIUM 9.1 9.0   Liver Function Tests:  Recent Labs Lab 05/15/13 1415  AST 21  ALT 16  ALKPHOS 93  BILITOT 0.4  PROT 8.1  ALBUMIN 3.7   No results found for this basename: LIPASE,  AMYLASE,  in the last 168 hours No results found for this basename: AMMONIA,  in the last 168 hours CBC:  Recent Labs Lab 05/15/13 1415 05/16/13 0212  WBC 9.1 8.5  HGB 13.7 12.8*  HCT 40.2 38.0*  MCV 95.9 95.7  PLT 276 254   Cardiac Enzymes:  Recent Labs Lab 05/15/13 1415 05/15/13 2042 05/16/13 0212 05/16/13 0801  TROPONINI <0.30 <0.30 <0.30 <0.30   BNP: BNP (last 3 results)  Recent Labs  05/15/13 1415 05/15/13 2042  PROBNP 1558.0* 1687.0*   CBG: No results found for this basename: GLUCAP,  in the last 168 hours     Signed:  Chaya Jan  [facility] Pager: [phone] 05/17/2013, 2:25 PM

## 2013-05-17 NOTE — Plan of Care (Signed)
Problem: Phase III Progression Outcomes Goal: Sinus rhythm established or heart rate < 100 at rest Outcome: Not Applicable Date Met:  05/17/13 Patient in afib, HR in the 80s and 90s, tachy at times with activity. MD aware. Okay for discharge. Earnstine Regal, RN

## 2013-05-17 NOTE — Progress Notes (Signed)
05/17/13 1513 reviewed discharge instructions with patient, nephew-andy Ilyas at bedside. Given copy of instructions, medication list, prescription, f/u appointment information. Cardiology to call his nephew with f/u appointment per MD request. Dr. Ardyth Harps left message with them to call him. Contact information for cardiology and PCP provided on AVS. Noted when home medications next due on list, patient and nephew aware. Verbalized understanding of instructions, when to call MD. IV site d/c'd per nurse tech, site within normal limits. No c/o pain or discomfort prior to discharge. Pt left floor in stable condition, via ambulation (refused wheelchair), accompanied by nurse. Earnstine Regal, RN

## 2013-05-26 ENCOUNTER — Encounter (HOSPITAL_COMMUNITY): Payer: Self-pay | Admitting: Emergency Medicine

## 2013-05-26 ENCOUNTER — Emergency Department (HOSPITAL_COMMUNITY)
Admission: EM | Admit: 2013-05-26 | Discharge: 2013-05-26 | Disposition: A | Discharge status: Home / Self Care | Payer: Medicare Other | Attending: Emergency Medicine | Admitting: Emergency Medicine

## 2013-05-26 ENCOUNTER — Emergency Department (HOSPITAL_COMMUNITY): Payer: Medicare Other

## 2013-05-26 DIAGNOSIS — R0789 Other chest pain: Secondary | ICD-10-CM | POA: Insufficient documentation

## 2013-05-26 DIAGNOSIS — Z8739 Personal history of other diseases of the musculoskeletal system and connective tissue: Secondary | ICD-10-CM | POA: Insufficient documentation

## 2013-05-26 DIAGNOSIS — Z79899 Other long term (current) drug therapy: Secondary | ICD-10-CM | POA: Insufficient documentation

## 2013-05-26 DIAGNOSIS — I10 Essential (primary) hypertension: Secondary | ICD-10-CM | POA: Insufficient documentation

## 2013-05-26 DIAGNOSIS — M542 Cervicalgia: Secondary | ICD-10-CM | POA: Insufficient documentation

## 2013-05-26 DIAGNOSIS — E7800 Pure hypercholesterolemia, unspecified: Secondary | ICD-10-CM | POA: Insufficient documentation

## 2013-05-26 DIAGNOSIS — J449 Chronic obstructive pulmonary disease, unspecified: Secondary | ICD-10-CM | POA: Insufficient documentation

## 2013-05-26 DIAGNOSIS — E785 Hyperlipidemia, unspecified: Secondary | ICD-10-CM | POA: Insufficient documentation

## 2013-05-26 DIAGNOSIS — F0390 Unspecified dementia without behavioral disturbance: Secondary | ICD-10-CM | POA: Insufficient documentation

## 2013-05-26 DIAGNOSIS — Z85820 Personal history of malignant melanoma of skin: Secondary | ICD-10-CM | POA: Insufficient documentation

## 2013-05-26 DIAGNOSIS — R5381 Other malaise: Secondary | ICD-10-CM | POA: Insufficient documentation

## 2013-05-26 DIAGNOSIS — R531 Weakness: Secondary | ICD-10-CM

## 2013-05-26 DIAGNOSIS — I4891 Unspecified atrial fibrillation: Secondary | ICD-10-CM | POA: Insufficient documentation

## 2013-05-26 DIAGNOSIS — Z87891 Personal history of nicotine dependence: Secondary | ICD-10-CM | POA: Insufficient documentation

## 2013-05-26 DIAGNOSIS — Z923 Personal history of irradiation: Secondary | ICD-10-CM | POA: Insufficient documentation

## 2013-05-26 DIAGNOSIS — Z87898 Personal history of other specified conditions: Secondary | ICD-10-CM | POA: Insufficient documentation

## 2013-05-26 DIAGNOSIS — J4489 Other specified chronic obstructive pulmonary disease: Secondary | ICD-10-CM | POA: Insufficient documentation

## 2013-05-26 DIAGNOSIS — I739 Peripheral vascular disease, unspecified: Secondary | ICD-10-CM | POA: Insufficient documentation

## 2013-05-26 DIAGNOSIS — Z7982 Long term (current) use of aspirin: Secondary | ICD-10-CM | POA: Insufficient documentation

## 2013-05-26 LAB — URINALYSIS, ROUTINE W REFLEX MICROSCOPIC
Bilirubin Urine: NEGATIVE
Ketones, ur: NEGATIVE mg/dL
Leukocytes, UA: NEGATIVE
Nitrite: NEGATIVE
Specific Gravity, Urine: 1.02 (ref 1.005–1.030)
Urobilinogen, UA: 0.2 mg/dL (ref 0.0–1.0)
pH: 7 (ref 5.0–8.0)

## 2013-05-26 LAB — BASIC METABOLIC PANEL
CO2: 25 mEq/L (ref 19–32)
Chloride: 100 mEq/L (ref 96–112)
Creatinine, Ser: 1.14 mg/dL (ref 0.50–1.35)
GFR calc Af Amer: 68 mL/min — ABNORMAL LOW (ref >90)
GFR calc non Af Amer: 59 mL/min — ABNORMAL LOW (ref >90)
Potassium: 3.8 mEq/L (ref 3.5–5.1)
Sodium: 137 mEq/L (ref 135–145)

## 2013-05-26 LAB — CBC WITH DIFFERENTIAL/PLATELET
Basophils Absolute: 0.1 10*3/uL (ref 0.0–0.1)
Lymphocytes Relative: 16 % (ref 12–46)
Neutro Abs: 5.5 10*3/uL (ref 1.7–7.7)
Neutrophils Relative %: 70 % (ref 43–77)
Platelets: 282 10*3/uL (ref 150–400)
RDW: 14.2 % (ref 11.5–15.5)
WBC: 7.8 10*3/uL (ref 4.0–10.5)

## 2013-05-26 LAB — URINE MICROSCOPIC-ADD ON

## 2013-05-26 LAB — TROPONIN I: Troponin I: <0.3 ng/mL (ref <0.30)

## 2013-05-26 NOTE — ED Notes (Signed)
Reports generalized weakness starting today.  Poor historian - denies pain/sob.

## 2013-05-26 NOTE — ED Notes (Signed)
Pt informed of urine sample needed. Urinal given. Pt voiced understanding.

## 2013-05-26 NOTE — ED Provider Notes (Signed)
CSN: [id number]     Arrival date & time 05/26/13  4098 History  This chart was scribed for Curtis Hutching, MD, by Yevette Edwards, ED Scribe. This patient was seen in room APA16A/AP[facility]16A and the patient's care was started at 9:46 AM.   First MD Initiated Contact with Patient 05/26/13 [phone]     Chief Complaint  Patient presents with  . Fatigue   The history is provided by the patient and a relative. The history is limited by the condition of the patient. No language interpreter was used.   Level Five Caveat: Dementia   HPI Comments: Curtis Lang is a 79 y.o. male, with a h/o ASCVD, COPD, and lymphoma, who presents to the Emergency Department complaining of generalized chest discomfort which began yesterday evening. He reports his chest "does not feel right." His sister-in-law states he is experiencing chest tightness and that he has also had several weeks of intermittent posterior neck pain. The pt's family reports he is more pale than usual. The pt was admitted to the ED ten days ago for similar symptoms, but he was discharged the following day. He was prescribed 325 mg aspirin and metoprolol succinate.   Dr. Phillips Odor is his PCP. He has a cardiology appointment with Dr. Wyline Mood at the [facility] tomorrow.   He is a former smoker.   Past Medical History  Diagnosis Date  . Arteriosclerotic cardiovascular disease (ASCVD)     BMS to RCA 1997; cutting ballon for RCA restenosis in 2001; cath 12/10 - 100% RCA L->R collaterals; nl EF; 40% LAD and OM1  . Hypertension   . Hyperlipidemia   . Cerebrovascular disease     bilateral bruits; duplex in 2009 - mild plaque w/o stenosis  . Peripheral vascular disease     ABI on 0.87 on left  . Tobacco abuse, in remission     60 pack years; discontinued in 2003  . COPD (chronic obstructive pulmonary disease)   . Fasting hyperglycemia   . DDD (degenerative disc disease), cervical   . Muscle discomfort     No improvement after statins temporary discontinued   . Hypercholesteremia   . Lymphoma     chemotherapy and radiation therapy in 2006; recurrence in 2007  . Squamous cell carcinoma     s/p Mohs surgery  . Cancer     b cell lymphoma  . Diffuse large B cell lymphoma 03/18/2010   Past Surgical History  Procedure Laterality Date  . Anterior fusion cervical spine  2003    posterior?  . Inguinal hernia repair  1996    Right  . Orif femur fracture  1996  . Mohs surgery    . Colonoscopy  05/16/2011    Procedure: COLONOSCOPY;  Surgeon: Dalia Heading; diverticulosis, sigmoid polypectomy  . Port-a-cath removal Right 03/10/2013    Procedure: REMOVAL PORT-A-CATH;  Surgeon: Marlane Hatcher, MD;  Location: [facility];  Service: General;  Laterality: Right;   Family History  Problem Relation Age of Onset  . Stroke Father     deceased - 70  . Stroke Mother     deceased - 26   . Heart attack Brother 56  . Heart attack Sister   . Colon cancer Neg Hx    History  Substance Use Topics  . Smoking status: Former Smoker -- 1.00 packs/day for 30 years  . Smokeless tobacco: Not on file  . Alcohol Use: No    Review of Systems  Unable to perform ROS: Dementia  Allergies  Review of patient's allergies indicates no known allergies.  Home Medications   Current Outpatient Rx  Name  Route  Sig  Dispense  Refill  . aspirin EC 325 MG EC tablet   Oral   Take 1 tablet (325 mg total) by mouth daily.   30 tablet   0   . atorvastatin (LIPITOR) 40 MG tablet   Oral   Take 40 mg by mouth daily.         . furosemide (LASIX) 40 MG tablet   Oral   Take 1 tablet (40 mg total) by mouth daily.   30 tablet   6     Generic EAV:WUJWJ    40MG  Generic XBJ:YNWGN    56M .Marland Kitchen.   . lisinopril (PRINIVIL,ZESTRIL) 20 MG tablet   Oral   Take 1 tablet (20 mg total) by mouth daily.   90 tablet   0     Pt needs appointment for further refills.   . metoprolol succinate (TOPROL-XL) 50 MG 24 hr tablet   Oral   Take 1 tablet (50 mg total) by mouth daily. Take  with or immediately following a meal.   30 tablet   3   . omeprazole (PRILOSEC) 20 MG capsule   Oral   Take 20 mg by mouth daily.         . potassium chloride (K-DUR,KLOR-CON) 10 MEQ tablet      TAKE 1 TABLET BY MOUTH DAILY. EMERGENCY REFILL FAXED DR   30 tablet   1     Generic For:K-DUR    CR Generic For:K-DUR    ...    Triage Vitals: BP 141/86  Pulse 102  Resp 20  SpO2 98%  Physical Exam  Nursing note and vitals reviewed. Constitutional: He is oriented to person, place, and time. He appears well-developed and well-nourished.  HENT:  Head: Normocephalic and atraumatic.  Eyes: Conjunctivae and EOM are normal. Pupils are equal, round, and reactive to light.  Neck: Normal range of motion. Neck supple.  Cardiovascular: Normal rate and normal heart sounds.   Irregularly irregular.   Pulmonary/Chest: Effort normal and breath sounds normal.  Abdominal: Soft. Bowel sounds are normal.  Musculoskeletal: Normal range of motion.  Neurological: He is alert and oriented to person, place, and time.  Skin: Skin is warm and dry.  Psychiatric: He has a normal mood and affect. His behavior is normal.    ED Course  Procedures (including critical care time)  DIAGNOSTIC STUDIES: Oxygen Saturation is 98% on room air, normal by my interpretation.    COORDINATION OF CARE:  10:04 AM- Discussed treatment plan with patient, which includes an EKG, UA, and troponin, and the patient agreed to the plan.   Labs Review Labs Reviewed  CBC WITH DIFFERENTIAL - Abnormal; Notable for the following:    RBC 4.21 (*)    Eosinophils Relative 6 (*)    All other components within normal limits  BASIC METABOLIC PANEL - Abnormal; Notable for the following:    Glucose, Bld 149 (*)    GFR calc non Af Amer 59 (*)    GFR calc Af Amer 68 (*)    All other components within normal limits  TROPONIN I  URINALYSIS, ROUTINE W REFLEX MICROSCOPIC   Imaging Review Dg Chest Portable 1 View  05/26/2013    CLINICAL DATA:  Chest tightness.  History of melanoma.  EXAM: PORTABLE CHEST - 1 VIEW  COMPARISON:  05/15/2013 chest x-ray and 03/13/2013 chest CT.  FINDINGS: Pulmonary vascular congestion.  CT detected right middle lobe 4 mm nodule not appreciated on the current plain film.  Heart size top-normal to slightly enlarged.  Calcified aorta.  Post axillary dissection.  No gross pneumothorax or segmental consolidation.  IMPRESSION: Pulmonary vascular congestion.  CT detected right middle lobe 4 mm nodule not appreciated on the current plain film.  Heart size top-normal to slightly enlarged.  Calcified aorta.   Electronically Signed   By: Bridgett Larsson M.D.   On: 05/26/2013 09:25    EKG Interpretation    Date/Time:  Monday May 26 2013 08:57:43 EST Ventricular Rate:  108 PR Interval:    QRS Duration: 118 QT Interval:  326 QTC Calculation: 436 R Axis:   -25 Text Interpretation:  Atrial fibrillation with rapid ventricular response Septal infarct (cited on or before 16-May-2013) Abnormal ECG When compared with ECG of 16-May-2013 05:14, QT has shortened Confirmed by Macintyre Alexa  MD, Clovia Reine ([id number]) on 05/26/2013 11:19:02 AM            MDM  No diagnosis found. Patient in no obvious distress. Has history of atrial fibrillation. Troponin negative. Patient has cardiology followup tomorrow.  I personally performed the services described in this documentation, which was scribed in my presence. The recorded information has been reviewed and is accurate.    Curtis Hutching, MD 05/26/13 [phone]

## 2013-05-27 ENCOUNTER — Encounter: Payer: Self-pay | Admitting: Cardiology

## 2013-05-27 ENCOUNTER — Ambulatory Visit (INDEPENDENT_AMBULATORY_CARE_PROVIDER_SITE_OTHER): Payer: Medicare Other | Admitting: Cardiology

## 2013-05-27 VITALS — BP 127/72 | HR 92 | Ht 67.0 in | Wt 168.0 lb

## 2013-05-27 DIAGNOSIS — I2510 Atherosclerotic heart disease of native coronary artery without angina pectoris: Secondary | ICD-10-CM

## 2013-05-27 DIAGNOSIS — I4891 Unspecified atrial fibrillation: Secondary | ICD-10-CM

## 2013-05-27 DIAGNOSIS — I5022 Chronic systolic (congestive) heart failure: Secondary | ICD-10-CM

## 2013-05-27 DIAGNOSIS — F0390 Unspecified dementia without behavioral disturbance: Secondary | ICD-10-CM

## 2013-05-27 MED ORDER — METOPROLOL SUCCINATE ER 100 MG PO TB24: 100.0000 mg | ORAL_TABLET | Freq: Every day | ORAL | Status: DC

## 2013-05-27 MED ORDER — APIXABAN 5 MG PO TABS: 5.0000 mg | ORAL_TABLET | Freq: Two times a day (BID) | ORAL | Status: DC

## 2013-05-27 NOTE — Patient Instructions (Signed)
Your physician recommends that you schedule a follow-up appointment in: 1 month with Dr. Wyline Mood. This appointment will be scheduled before you leave.  Your physician has recommended you make the following change in your medication:  Decrease Aspirin 81 MG once daily Increase Toprol XL 100 MG once daily Start Eliquis 5 MG twice daily  Continue all other medications the same.   You have been referred to Neurology in [place].

## 2013-05-27 NOTE — Progress Notes (Signed)
Clinical Summary Curtis Lang is a 79 y.o.male seen today as a hospital follow up appointment.   1. CAD/Systolic dysfunction - prior PCI to RCA in 1997 with cutting balloon to RCA in 2001, last cath 05/2009 with chronically occluded RCA with left to right collaterals and moderate disease of LAD and LCX. LV gram at that time showed LVEF 55% with inferior hypokinesis. - admitted 05/15/13 to [facility] with chest pain, though patient was very poor historian and the history of symptoms was unclear. There was no evidence of ACS by EKG or cardiac enzymes. Echo showed new finding of severe LV systolic dysfunction, LVEF 25-30%.  - patient is very poor historian and is not able to describe if he experiences dyspnea, orthopnea, PND, or lower extremity edema.   2. Afib - new diagnosis 05/2013 - CHADS2Vasc score of 5, he has not been initiated on anticoagulation previously  3. Lymphoma -discussed status with his oncologist, he is in remission currently. No contraindication to starting anticoagulation from there standpoint.   Past Medical History  Diagnosis Date  . Arteriosclerotic cardiovascular disease (ASCVD)     BMS to RCA 1997; cutting ballon for RCA restenosis in 2001; cath 12/10 - 100% RCA L->R collaterals; nl EF; 40% LAD and OM1  . Hypertension   . Hyperlipidemia   . Cerebrovascular disease     bilateral bruits; duplex in 2009 - mild plaque w/o stenosis  . Peripheral vascular disease     ABI on 0.87 on left  . Tobacco abuse, in remission     60 pack years; discontinued in 2003  . COPD (chronic obstructive pulmonary disease)   . Fasting hyperglycemia   . DDD (degenerative disc disease), cervical   . Muscle discomfort     No improvement after statins temporary discontinued  . Hypercholesteremia   . Lymphoma     chemotherapy and radiation therapy in 2006; recurrence in 2007  . Squamous cell carcinoma     s/p Mohs surgery  . Cancer     b cell lymphoma  . 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 aspirin EC 325 MG EC tablet Take 1 tablet (325 mg total) by mouth daily.  30 tablet  0  . atorvastatin (LIPITOR) 40 MG tablet Take 40 mg by mouth daily.      Marland Kitchen lisinopril (PRINIVIL,ZESTRIL) 20 MG tablet Take 1 tablet (20 mg total) by mouth daily.  90 tablet  0  . metoprolol succinate (TOPROL-XL) 50 MG 24 hr tablet Take 1 tablet (50 mg total) by mouth daily. Take with or immediately following a meal.  30 tablet  3  . omeprazole (PRILOSEC) 20 MG capsule Take 20 mg by mouth daily.      . potassium chloride (K-DUR,KLOR-CON) 10 MEQ tablet TAKE 1 TABLET BY MOUTH DAILY. EMERGENCY REFILL FAXED DR  30 tablet  1   No current facility-administered medications for this visit.     Past Surgical History  Procedure Laterality Date  . Anterior fusion cervical spine  2003    posterior?  . Inguinal hernia repair  1996    Right  . Orif femur fracture  1996  . Mohs surgery    . Colonoscopy  05/16/2011    Procedure: COLONOSCOPY;  Surgeon: Dalia Heading; diverticulosis, sigmoid polypectomy  . Port-a-cath removal Right 03/10/2013    Procedure: REMOVAL PORT-A-CATH;  Surgeon: Marlane Hatcher,  MD;  Location: [facility];  Service: General;  Laterality: Right;     No Known Allergies    Family History  Problem Relation Age of Onset  . Stroke Father     deceased - 51  . Stroke Mother     deceased - 70   . Heart attack Brother 56  . Heart attack Sister   . Colon cancer Neg Hx      Social History Curtis Lang reports that he has quit smoking. He does not have any smokeless tobacco history on file. Curtis Lang reports that he does not drink alcohol.   Review of Systems Unable to obtain 14 point review of systems, patient does not recall if he has had any symptoms at all.  .   Physical Examination p 92 bp 127/72 Wt 168 lbs BMI 26 Gen: resting comfortably, no acute distress HEENT:  no scleral icterus, pupils equal round and reactive, no palptable cervical adenopathy,  CV: RRR, no m/r/g, no JVD, no carotid bruits Resp: Clear to auscultation bilaterally GI: abdomen is soft, non-tender, non-distended, normal bowel sounds, no hepatosplenomegaly MSK: extremities are warm, no edema.  Skin: warm, no rash Neuro:  no focal deficits. He oriented to person and place but not time.  Psych: appropriate affect   Diagnostic Studies 05/16/13 Echo LVEF 25-30%, mild LVH, increased LA pressure, mild MR,mild RV dysfunction     Assessment and Plan  1. CAD/Systolic dysfunction - prior history of CAD with interventions, recently found to have severe LV systolic dysfunction LVEF 25-30%. This is most likely secondary to ischemic cardiomyopathy - unable to assess patient's symptoms as he is a very poor historian, he does not carry the diagnosis but he has clear signs of advanced dementia - will increase Toprol XL to 100mg  daily and change ASA to 81 mg daily - will continue to consider ischemic evaluation, however would like to have the patient seen by neuro first to further evaluate his advanced dementia.  2. Afib - continue rate control, patient with CHADS2Vascscore of 5, will start elaquis 5mg  bid for stroke prophylaxis.  3. Lymphoma - based on prior correspondence with his oncology team, he does not have active disease. They agreed it was ok to start anticoagulation, if we pursue heart cath likely touch base with them again prior.    Follow up 1 month   Antoine Poche, M.D., F.A.C.C.

## 2013-06-20 ENCOUNTER — Other Ambulatory Visit: Payer: Self-pay | Admitting: Physician Assistant

## 2013-06-20 ENCOUNTER — Other Ambulatory Visit: Payer: Self-pay | Admitting: Cardiology

## 2013-06-23 ENCOUNTER — Ambulatory Visit: Payer: Self-pay | Admitting: Neurology

## 2013-06-24 ENCOUNTER — Ambulatory Visit: Payer: Medicare Other | Admitting: Cardiology

## 2013-08-28 ENCOUNTER — Other Ambulatory Visit (HOSPITAL_COMMUNITY): Payer: Self-pay | Admitting: Family Medicine

## 2013-08-28 ENCOUNTER — Ambulatory Visit (HOSPITAL_COMMUNITY)
Admission: RE | Admit: 2013-08-28 | Discharge: 2013-08-28 | Disposition: A | Discharge status: Home / Self Care | Payer: Medicare Other | Source: Ambulatory Visit | Attending: Family Medicine | Admitting: Family Medicine

## 2013-08-28 DIAGNOSIS — J90 Pleural effusion, not elsewhere classified: Secondary | ICD-10-CM | POA: Insufficient documentation

## 2013-08-28 DIAGNOSIS — R109 Unspecified abdominal pain: Secondary | ICD-10-CM

## 2013-09-03 ENCOUNTER — Encounter: Payer: Self-pay | Admitting: *Deleted

## 2013-09-05 ENCOUNTER — Inpatient Hospital Stay (HOSPITAL_COMMUNITY)
Admission: EM | Admit: 2013-09-05 | Discharge: 2013-09-09 | DRG: 292 | Disposition: A | Discharge status: Skilled Nursing Facility | Payer: Medicare Other | Attending: Internal Medicine | Admitting: Internal Medicine

## 2013-09-05 ENCOUNTER — Encounter (HOSPITAL_COMMUNITY): Payer: Self-pay | Admitting: Emergency Medicine

## 2013-09-05 ENCOUNTER — Emergency Department (HOSPITAL_COMMUNITY): Payer: Medicare Other

## 2013-09-05 DIAGNOSIS — C8330 Diffuse large B-cell lymphoma, unspecified site: Secondary | ICD-10-CM | POA: Diagnosis present

## 2013-09-05 DIAGNOSIS — Z8249 Family history of ischemic heart disease and other diseases of the circulatory system: Secondary | ICD-10-CM

## 2013-09-05 DIAGNOSIS — Z923 Personal history of irradiation: Secondary | ICD-10-CM

## 2013-09-05 DIAGNOSIS — Z66 Do not resuscitate: Secondary | ICD-10-CM | POA: Diagnosis present

## 2013-09-05 DIAGNOSIS — D649 Anemia, unspecified: Secondary | ICD-10-CM

## 2013-09-05 DIAGNOSIS — I739 Peripheral vascular disease, unspecified: Secondary | ICD-10-CM | POA: Diagnosis present

## 2013-09-05 DIAGNOSIS — E785 Hyperlipidemia, unspecified: Secondary | ICD-10-CM | POA: Diagnosis present

## 2013-09-05 DIAGNOSIS — I4891 Unspecified atrial fibrillation: Secondary | ICD-10-CM | POA: Diagnosis present

## 2013-09-05 DIAGNOSIS — E440 Moderate protein-calorie malnutrition: Secondary | ICD-10-CM | POA: Insufficient documentation

## 2013-09-05 DIAGNOSIS — N400 Enlarged prostate without lower urinary tract symptoms: Secondary | ICD-10-CM

## 2013-09-05 DIAGNOSIS — J4489 Other specified chronic obstructive pulmonary disease: Secondary | ICD-10-CM | POA: Diagnosis present

## 2013-09-05 DIAGNOSIS — I679 Cerebrovascular disease, unspecified: Secondary | ICD-10-CM | POA: Diagnosis present

## 2013-09-05 DIAGNOSIS — R0789 Other chest pain: Secondary | ICD-10-CM

## 2013-09-05 DIAGNOSIS — I5022 Chronic systolic (congestive) heart failure: Secondary | ICD-10-CM

## 2013-09-05 DIAGNOSIS — IMO0002 Reserved for concepts with insufficient information to code with codable children: Secondary | ICD-10-CM

## 2013-09-05 DIAGNOSIS — F0390 Unspecified dementia without behavioral disturbance: Secondary | ICD-10-CM | POA: Diagnosis present

## 2013-09-05 DIAGNOSIS — I5023 Acute on chronic systolic (congestive) heart failure: Secondary | ICD-10-CM | POA: Diagnosis present

## 2013-09-05 DIAGNOSIS — C8589 Other specified types of non-Hodgkin lymphoma, extranodal and solid organ sites: Secondary | ICD-10-CM | POA: Diagnosis present

## 2013-09-05 DIAGNOSIS — Z823 Family history of stroke: Secondary | ICD-10-CM

## 2013-09-05 DIAGNOSIS — M5030 Other cervical disc degeneration, unspecified cervical region: Secondary | ICD-10-CM | POA: Diagnosis present

## 2013-09-05 DIAGNOSIS — I10 Essential (primary) hypertension: Secondary | ICD-10-CM | POA: Diagnosis present

## 2013-09-05 DIAGNOSIS — J449 Chronic obstructive pulmonary disease, unspecified: Secondary | ICD-10-CM

## 2013-09-05 DIAGNOSIS — E7800 Pure hypercholesterolemia, unspecified: Secondary | ICD-10-CM | POA: Diagnosis present

## 2013-09-05 DIAGNOSIS — I509 Heart failure, unspecified: Secondary | ICD-10-CM | POA: Diagnosis present

## 2013-09-05 DIAGNOSIS — I2589 Other forms of chronic ischemic heart disease: Secondary | ICD-10-CM | POA: Diagnosis present

## 2013-09-05 DIAGNOSIS — I2510 Atherosclerotic heart disease of native coronary artery without angina pectoris: Secondary | ICD-10-CM | POA: Diagnosis present

## 2013-09-05 DIAGNOSIS — J811 Chronic pulmonary edema: Secondary | ICD-10-CM

## 2013-09-05 DIAGNOSIS — Z87891 Personal history of nicotine dependence: Secondary | ICD-10-CM

## 2013-09-05 DIAGNOSIS — Z9221 Personal history of antineoplastic chemotherapy: Secondary | ICD-10-CM

## 2013-09-05 DIAGNOSIS — F17201 Nicotine dependence, unspecified, in remission: Secondary | ICD-10-CM

## 2013-09-05 DIAGNOSIS — Z7982 Long term (current) use of aspirin: Secondary | ICD-10-CM

## 2013-09-05 DIAGNOSIS — R079 Chest pain, unspecified: Secondary | ICD-10-CM

## 2013-09-05 LAB — CBC WITH DIFFERENTIAL/PLATELET
Basophils Absolute: 0 10*3/uL (ref 0.0–0.1)
Basophils Relative: 0 % (ref 0–1)
Eosinophils Absolute: 0.1 10*3/uL (ref 0.0–0.7)
Eosinophils Relative: 1 % (ref 0–5)
HEMATOCRIT: 42.4 % (ref 39.0–52.0)
HEMOGLOBIN: 14.1 g/dL (ref 13.0–17.0)
Lymphocytes Relative: 10 % — ABNORMAL LOW (ref 12–46)
Lymphs Abs: 0.8 10*3/uL (ref 0.7–4.0)
MCH: 32.6 pg (ref 26.0–34.0)
MCHC: 33.3 g/dL (ref 30.0–36.0)
MCV: 98.1 fL (ref 78.0–100.0)
MONO ABS: 0.5 10*3/uL (ref 0.1–1.0)
MONOS PCT: 6 % (ref 3–12)
Neutro Abs: 7 10*3/uL (ref 1.7–7.7)
Neutrophils Relative %: 84 % — ABNORMAL HIGH (ref 43–77)
Platelets: 285 10*3/uL (ref 150–400)
RBC: 4.32 MIL/uL (ref 4.22–5.81)
RDW: 16.3 % — ABNORMAL HIGH (ref 11.5–15.5)
WBC: 8.4 10*3/uL (ref 4.0–10.5)

## 2013-09-05 LAB — BASIC METABOLIC PANEL
BUN: 19 mg/dL (ref 6–23)
CHLORIDE: 98 meq/L (ref 96–112)
CO2: 21 mEq/L (ref 19–32)
Calcium: 9.3 mg/dL (ref 8.4–10.5)
Creatinine, Ser: 1.24 mg/dL (ref 0.50–1.35)
GFR calc non Af Amer: 53 mL/min — ABNORMAL LOW (ref >90)
GFR, EST AFRICAN AMERICAN: 62 mL/min — AB (ref >90)
GLUCOSE: 141 mg/dL — AB (ref 70–99)
Potassium: 4.7 mEq/L (ref 3.7–5.3)
SODIUM: 136 meq/L — AB (ref 137–147)

## 2013-09-05 LAB — MRSA PCR SCREENING: MRSA by PCR: NEGATIVE

## 2013-09-05 LAB — TROPONIN I: Troponin I: <0.3 ng/mL (ref <0.30)

## 2013-09-05 LAB — PRO B NATRIURETIC PEPTIDE: Pro B Natriuretic peptide (BNP): 16211 pg/mL — ABNORMAL HIGH (ref 0–450)

## 2013-09-05 MED ORDER — ATORVASTATIN CALCIUM 40 MG PO TABS
40.0000 mg | ORAL_TABLET | Freq: Every day | ORAL | Status: DC
  Administered 2013-09-05 – 2013-09-08 (×4): 40 mg via ORAL
  Filled 2013-09-05 (×4): qty 1

## 2013-09-05 MED ORDER — FUROSEMIDE 10 MG/ML IJ SOLN
40.0000 mg | Freq: Once | INTRAMUSCULAR | Status: AC
  Administered 2013-09-05: 40 mg via INTRAVENOUS
  Filled 2013-09-05: qty 4

## 2013-09-05 MED ORDER — DILTIAZEM HCL 100 MG IV SOLR
5.0000 mg/h | INTRAVENOUS | Status: DC
  Administered 2013-09-05 (×2): 7 mg/h via INTRAVENOUS
  Filled 2013-09-05: qty 100

## 2013-09-05 MED ORDER — POTASSIUM CHLORIDE CRYS ER 20 MEQ PO TBCR
20.0000 meq | EXTENDED_RELEASE_TABLET | Freq: Two times a day (BID) | ORAL | Status: DC
  Administered 2013-09-05 – 2013-09-09 (×9): 20 meq via ORAL
  Filled 2013-09-05 (×9): qty 1

## 2013-09-05 MED ORDER — METOPROLOL SUCCINATE ER 50 MG PO TB24
100.0000 mg | ORAL_TABLET | Freq: Every day | ORAL | Status: DC
  Administered 2013-09-05 – 2013-09-09 (×5): 100 mg via ORAL
  Filled 2013-09-05 (×5): qty 2

## 2013-09-05 MED ORDER — SODIUM CHLORIDE 0.9 % IJ SOLN
3.0000 mL | INTRAMUSCULAR | Status: DC | PRN
Start: 2013-09-05 — End: 2013-09-09

## 2013-09-05 MED ORDER — ACETAMINOPHEN 325 MG PO TABS: 650.0000 mg | ORAL_TABLET | Freq: Four times a day (QID) | ORAL | Status: DC | PRN

## 2013-09-05 MED ORDER — DILTIAZEM LOAD VIA INFUSION
10.0000 mg | Freq: Once | INTRAVENOUS | Status: AC
  Administered 2013-09-05: 10 mg via INTRAVENOUS
  Filled 2013-09-05: qty 10

## 2013-09-05 MED ORDER — APIXABAN 5 MG PO TABS
5.0000 mg | ORAL_TABLET | Freq: Two times a day (BID) | ORAL | Status: DC
  Administered 2013-09-05 – 2013-09-09 (×9): 5 mg via ORAL
  Filled 2013-09-05 (×9): qty 1

## 2013-09-05 MED ORDER — ONDANSETRON HCL 4 MG/2ML IJ SOLN
4.0000 mg | Freq: Four times a day (QID) | INTRAMUSCULAR | Status: DC | PRN
Start: 2013-09-05 — End: 2013-09-09

## 2013-09-05 MED ORDER — DEXTROSE 5 % IV SOLN
5.0000 mg/h | INTRAVENOUS | Status: DC
  Administered 2013-09-05: 5 mg/h via INTRAVENOUS
  Filled 2013-09-05: qty 100

## 2013-09-05 MED ORDER — FUROSEMIDE 10 MG/ML IJ SOLN
60.0000 mg | Freq: Two times a day (BID) | INTRAMUSCULAR | Status: DC
  Administered 2013-09-05: 20 mg via INTRAVENOUS
  Administered 2013-09-06 – 2013-09-07 (×3): 60 mg via INTRAVENOUS
  Filled 2013-09-05 (×4): qty 6

## 2013-09-05 MED ORDER — LISINOPRIL 10 MG PO TABS
10.0000 mg | ORAL_TABLET | Freq: Every day | ORAL | Status: DC
  Administered 2013-09-05 – 2013-09-09 (×4): 10 mg via ORAL
  Filled 2013-09-05 (×5): qty 1

## 2013-09-05 MED ORDER — SODIUM CHLORIDE 0.9 % IV SOLN
INTRAVENOUS | Status: DC
  Administered 2013-09-05: 12:00:00 via INTRAVENOUS

## 2013-09-05 MED ORDER — PANTOPRAZOLE SODIUM 40 MG PO TBEC
40.0000 mg | DELAYED_RELEASE_TABLET | Freq: Every day | ORAL | Status: DC
  Administered 2013-09-05 – 2013-09-09 (×5): 40 mg via ORAL
  Filled 2013-09-05 (×5): qty 1

## 2013-09-05 MED ORDER — BOOST / RESOURCE BREEZE PO LIQD
1.0000 | Freq: Three times a day (TID) | ORAL | Status: DC
  Administered 2013-09-06 – 2013-09-09 (×9): 1 via ORAL

## 2013-09-05 NOTE — ED Notes (Signed)
Pt here by POV for sob for 8-10 days.  Pt denies any cough or cold.  Pt denies any CP with this but states that "my chest feels funny sometimes". Pt is not able to give me any information on his health history.  Pt came back to room ambulatory as he refused a wheelchair

## 2013-09-05 NOTE — Progress Notes (Signed)
UR chart review completed.  

## 2013-09-05 NOTE — H&P (Addendum)
[facility] History and Physical  Curtis Lang NFA:[id number] DOB: 04-01-1931 DOA: 09/05/2013  Referring physician: EDP PCP: Purvis Kilts, MD  Cards: Dr.Jonathan Branch  Chief Complaint: shortness of breath  HPI: Curtis Lang is a 80 y.o. male with PMH of Chronic Systolic CHF/Ischemic cardiomyopathy, EF of 25%, Afib recently started on Apixaban, Dementia, H/o Diffuse large B cell lymphoma presents to the ER today with the above complaints. Pt reports progressive dyspnea on exertion for past week to 10days, with some Orthopnea and leg swelling. Pt unclear on his medications and his friend reports that due to his dementia she thinks that he gets his medications mixed up and not sure if he is compliant with his diet as he lives alone. In ER, noted to be in heart failure with Afib, RVR and started on a Diltiazem drip  Review of Systems:  Constitutional:  No weight loss, night sweats, Fevers, chills, fatigue.  HEENT:  No headaches, Difficulty swallowing,Tooth/dental problems,Sore throat,  No sneezing, itching, ear ache, nasal congestion, post nasal drip,  Cardio-vascular:  No chest pain, Orthopnea, PND, swelling in lower extremities, anasarca, dizziness, palpitations  GI:  No heartburn, indigestion, abdominal pain, nausea, vomiting, diarrhea, change in bowel habits, loss of appetite  Resp:  No shortness of breath with exertion or at rest. No excess mucus, no productive cough, No non-productive cough, No coughing up of blood.No change in color of mucus.No wheezing.No chest wall deformity  Skin:  no rash or lesions.  GU:  no dysuria, change in color of urine, no urgency or frequency. No flank pain.  Musculoskeletal:  No joint pain or swelling. No decreased range of motion. No back pain.  Psych:  No change in mood or affect. No depression or anxiety. No memory loss.   Past Medical History  Diagnosis Date  . Arteriosclerotic cardiovascular disease (ASCVD)     BMS to  RCA 1997; cutting ballon for RCA restenosis in 2001; cath 12/10 - 100% RCA L->R collaterals; nl EF; 40% LAD and OM1  . Hypertension   . Hyperlipidemia   . Cerebrovascular disease     bilateral bruits; duplex in 2009 - mild plaque w/o stenosis  . Peripheral vascular disease     ABI on 0.87 on left  . Tobacco abuse, in remission     60 pack years; discontinued in 2003  . COPD (chronic obstructive pulmonary disease)   . Fasting hyperglycemia   . DDD (degenerative disc disease), cervical   . Muscle discomfort     No improvement after statins temporary discontinued  . Hypercholesteremia   . Lymphoma     chemotherapy and radiation therapy in 2006; recurrence in 2007  . Squamous cell carcinoma     s/p Mohs surgery  . Cancer     b cell lymphoma  . Diffuse large B cell lymphoma 03/18/2010   Past Surgical History  Procedure Laterality Date  . Anterior fusion cervical spine  2003    posterior?  . Inguinal hernia repair  1996    Right  . Orif femur fracture  1996  . Mohs surgery    . Colonoscopy  05/16/2011    Procedure: COLONOSCOPY;  Surgeon: Jamesetta So; diverticulosis, sigmoid polypectomy  . Port-a-cath removal Right 03/10/2013    Procedure: REMOVAL PORT-A-CATH;  Surgeon: Scherry Ran, MD;  Location: [facility];  Service: General;  Laterality: Right;   Social History:  reports that he has quit smoking. He does not have any smokeless tobacco history on file.  He reports that he does not drink alcohol or use illicit drugs.  No Known Allergies  Family History  Problem Relation Age of Onset  . Stroke Father     deceased - 3  . Stroke Mother     deceased - 49   . Heart attack Brother 86  . Heart attack Sister   . Colon cancer Neg Hx      Prior to Admission medications   Medication Sig Start Date End Date Taking? Authorizing Provider  apixaban (ELIQUIS) 5 MG TABS tablet Take 1 tablet (5 mg total) by mouth 2 (two) times daily. 05/27/13   Arnoldo Lenis, MD  aspirin 81 MG  tablet Take 81 mg by mouth daily.    Historical Provider, MD  atorvastatin (LIPITOR) 40 MG tablet Take 40 mg by mouth daily.    Historical Provider, MD  atorvastatin (LIPITOR) 40 MG tablet TAKE 1 TABLET BY MOUTH DAILY. 06/20/13   Arnoldo Lenis, MD  furosemide (LASIX) 40 MG tablet TAKE 1 TABLET BY MOUTH DAILY. 06/20/13   Arnoldo Lenis, MD  lisinopril (PRINIVIL,ZESTRIL) 20 MG tablet TAKE 1 TABLET BY MOUTH EVERY DAY 06/20/13   Arnoldo Lenis, MD  metoprolol succinate (TOPROL-XL) 100 MG 24 hr tablet Take 1 tablet (100 mg total) by mouth daily. Take with or immediately following a meal. 05/27/13   Arnoldo Lenis, MD  omeprazole (PRILOSEC) 20 MG capsule Take 20 mg by mouth daily. 02/11/13   Historical Provider, MD  potassium chloride (K-DUR,KLOR-CON) 10 MEQ tablet TAKE 1 TABLET BY MOUTH DAILY. EMERGENCY REFILL FAXED DR 05/12/13   Lendon Colonel, NP   Physical Exam: Filed Vitals:   09/05/13 1200  BP: 131/88  Pulse: 60  Temp:   Resp: 25    BP 131/88  Pulse 60  Temp(Src) 97.7 F (36.5 C) (Oral)  Resp 25  Ht 5\' 9"  (1.753 m)  Wt 72.576 kg (160 lb)  BMI 23.62 kg/m2  SpO2 92%  General:  Appears calm and comfortable, AAO to self and place, confused about time Eyes: PERRL, normal lids, irises & conjunctiva ENT: grossly normal hearing, lips & tongue Neck: no LAD, masses or thyromegaly Cardiovascular: RRR, no m/r/g. No LE edema. Telemetry: afib , RVR Respiratory:crackles at both bases Abdomen: soft, ntnd Skin: no rash or induration seen on limited exam Musculoskeletal: 2 plus edema b/l Psychiatric: grossly normal mood and affect, speech fluent, difficulty with short term memory Neurologic: grossly non-focal.          Labs on Admission:  Basic Metabolic Panel:  Recent Labs Lab 09/05/13 1118  NA 136*  K 4.7  CL 98  CO2 21  GLUCOSE 141*  BUN 19  CREATININE 1.24  CALCIUM 9.3   Liver Function Tests: No results found for this basename: AST, ALT, ALKPHOS, BILITOT, PROT,  ALBUMIN,  in the last 168 hours No results found for this basename: LIPASE, AMYLASE,  in the last 168 hours No results found for this basename: AMMONIA,  in the last 168 hours CBC:  Recent Labs Lab 09/05/13 1118  WBC 8.4  NEUTROABS 7.0  HGB 14.1  HCT 42.4  MCV 98.1  PLT 285   Cardiac Enzymes:  Recent Labs Lab 09/05/13 1118  TROPONINI <0.30    BNP (last 3 results)  Recent Labs  05/15/13 1415 05/15/13 2042 09/05/13 1118  PROBNP 1558.0* 1687.0* 16211.0*   CBG: No results found for this basename: GLUCAP,  in the last 168 hours  Radiological Exams on Admission:  Dg Chest Port 1 View  09/05/2013   CLINICAL DATA:  Short of breath  EXAM: PORTABLE CHEST - 1 VIEW  COMPARISON:  08/28/2013  FINDINGS: Worsening congestive heart failure with edema and bilateral effusions. Hypoventilation with bibasilar atelectasis.  IMPRESSION: Worsening congestive heart failure.   Electronically Signed   By: Franchot Gallo M.D.   On: 09/05/2013 11:39    EKG: Independently reviewed. afib with RVR  Assessment/Plan   Acute on chronic systolic CHF -Admit to SDU -Diurese with IV lasix 60mg  Q12 -last ECHO 12/14, EF of 25% -I/Os, daily weights -continue Toprol, Lisinopril -Question compliance with med regimen/diet due to Dementia  Afib with RVR -currently on diltiazem gtt in ER, will continue same -resume Toprol -continue Apixaban  Diffuse Large B cell lymphoma -reportedly in remission per Pt and friend  Dementia -supposedly on aricept, will ask Pharmacy to confirm  Hypertension -continue toprol and lisinopril  COPD -stable  DVT proph: on anticoagulation with apixaban  Code Status: DNR, per pt wishes Family Communication: d/w pt and friend at bedside Disposition Plan: inpatient  Time spent: 29min  Curtis Lang [facility] Pager [phone]

## 2013-09-05 NOTE — ED Provider Notes (Signed)
CSN: [id number]     Arrival date & time 09/05/13  1022 History   First MD Initiated Contact with Patient 09/05/13 1056     Chief Complaint  Patient presents with  . Shortness of Breath      Patient is a 80 y.o. male presenting with shortness of breath. The history is provided by the patient and a friend. The history is limited by the condition of the patient (Hx dementia).  Shortness of Breath Pt was seen at 1100. Per pt and his friend: c/o gradual onset and worsening of persistent SOB for the past 10 days. SOB worsens with ambulation. Has been associated with increasing pedal edema. Pt's friend states pt was evaluated by his PMD last week for same, "and just given Aricept for his forgetfulness." States they tried to go back to his PMD today for re-evaluation, but were unable to be seen. Pt himself denies any complaints while sitting on stretcher.     Past Medical History  Diagnosis Date  . Arteriosclerotic cardiovascular disease (ASCVD)     BMS to RCA 1997; cutting ballon for RCA restenosis in 2001; cath 12/10 - 100% RCA L->R collaterals; nl EF; 40% LAD and OM1  . Hypertension   . Hyperlipidemia   . Cerebrovascular disease     bilateral bruits; duplex in 2009 - mild plaque w/o stenosis  . Peripheral vascular disease     ABI on 0.87 on left  . Tobacco abuse, in remission     60 pack years; discontinued in 2003  . COPD (chronic obstructive pulmonary disease)   . Fasting hyperglycemia   . DDD (degenerative disc disease), cervical   . Muscle discomfort     No improvement after statins temporary discontinued  . Hypercholesteremia   . Lymphoma     chemotherapy and radiation therapy in 2006; recurrence in 2007  . Squamous cell carcinoma     s/p Mohs surgery  . Cancer     b cell lymphoma  . Diffuse large B cell lymphoma 03/18/2010   Past Surgical History  Procedure Laterality Date  . Anterior fusion cervical spine  2003    posterior?  . Inguinal hernia repair  1996    Right  .  Orif femur fracture  1996  . Mohs surgery    . Colonoscopy  05/16/2011    Procedure: COLONOSCOPY;  Surgeon: Jamesetta So; diverticulosis, sigmoid polypectomy  . Port-a-cath removal Right 03/10/2013    Procedure: REMOVAL PORT-A-CATH;  Surgeon: Scherry Ran, MD;  Location: [facility];  Service: General;  Laterality: Right;   Family History  Problem Relation Age of Onset  . Stroke Father     deceased - 4  . Stroke Mother     deceased - 30   . Heart attack Brother 19  . Heart attack Sister   . Colon cancer Neg Hx    History  Substance Use Topics  . Smoking status: Former Smoker -- 1.00 packs/day for 30 years  . Smokeless tobacco: Not on file  . Alcohol Use: No    Review of Systems  Unable to perform ROS: Dementia  Respiratory: Positive for shortness of breath.       Allergies  Review of patient's allergies indicates no known allergies.  Home Medications   Current Outpatient Rx  Name  Route  Sig  Dispense  Refill  . apixaban (ELIQUIS) 5 MG TABS tablet   Oral   Take 1 tablet (5 mg total) by mouth 2 (two) times daily.  60 tablet   6   . aspirin 81 MG tablet   Oral   Take 81 mg by mouth daily.         Marland Kitchen atorvastatin (LIPITOR) 40 MG tablet   Oral   Take 40 mg by mouth daily.         Marland Kitchen atorvastatin (LIPITOR) 40 MG tablet      TAKE 1 TABLET BY MOUTH DAILY.   30 tablet   1     Generic YSA:[id number]   40MG   06/20/2013 1:20:58 PM   . furosemide (LASIX) 40 MG tablet      TAKE 1 TABLET BY MOUTH DAILY.   30 tablet   6     Generic UXN:ATFTD    40MG   06/20/2013 1:21:34 PM   . lisinopril (PRINIVIL,ZESTRIL) 20 MG tablet      TAKE 1 TABLET BY MOUTH EVERY DAY   90 tablet   6     Generic DUK:[id number]   20MG   06/20/2013 1:20:46 PM   . metoprolol succinate (TOPROL-XL) 100 MG 24 hr tablet   Oral   Take 1 tablet (100 mg total) by mouth daily. Take with or immediately following a meal.   30 tablet   6     Dose Increase 05-27-2013   . omeprazole (PRILOSEC)  20 MG capsule   Oral   Take 20 mg by mouth daily.         . potassium chloride (K-DUR,KLOR-CON) 10 MEQ tablet      TAKE 1 TABLET BY MOUTH DAILY. EMERGENCY REFILL FAXED DR   30 tablet   1     Generic For:K-DUR    10MEQ CR Generic For:K-DUR    ...    BP 131/88  Pulse 60  Temp(Src) 97.7 F (36.5 C) (Oral)  Resp 25  Ht 5\' 9"  (1.753 m)  Wt 160 lb (72.576 kg)  BMI 23.62 kg/m2  SpO2 92% Physical Exam 1105: Physical examination:  Nursing notes reviewed; Vital signs and O2 SAT reviewed;  Constitutional: Well developed, Well nourished, Well hydrated, In no acute distress; Head:  Normocephalic, atraumatic; Eyes: EOMI, PERRL, No scleral icterus; ENMT: Mouth and pharynx normal, Mucous membranes moist; Neck: Supple, Full range of motion, No lymphadenopathy; Cardiovascular: Tachycardic rate and irregular irregular rhythm, No gallop; Respiratory: Breath sounds coarse & equal bilaterally, No wheezes. Speaking full sentences with ease, Normal respiratory effort/excursion; Chest: Nontender, Movement normal; Abdomen: Soft, Nontender, Nondistended, Normal bowel sounds; Genitourinary: No CVA tenderness; Extremities: Pulses normal, No tenderness, +1 pedal edema bilat without calf asymmetry.; Neuro: Awake, alert, vague historian. +HOH, otherwise major CN grossly intact.  Speech clear. No gross focal motor or sensory deficits in extremities.; Skin: Color normal, Warm, Dry.    ED Course  Procedures   EKG Interpretation None      MDM  MDM Reviewed: nursing note, vitals and previous chart Reviewed previous: labs and ECG Interpretation: labs, ECG and x-ray Total time providing critical care: 30-74 minutes. This excludes time spent performing separately reportable procedures and services. Consults: admitting MD    CRITICAL CARE Performed by: Alfonzo Feller Total critical care time: 40 Critical care time was exclusive of separately billable procedures and treating other patients. Critical  care was necessary to treat or prevent imminent or life-threatening deterioration. Critical care was time spent personally by me on the following activities: development of treatment plan with patient and/or surrogate as well as nursing, discussions with consultants, evaluation of patient's response to treatment, examination of patient, obtaining history from  patient or surrogate, ordering and performing treatments and interventions, ordering and review of laboratory studies, ordering and review of radiographic studies, pulse oximetry and re-evaluation of patient's condition.    Date: 09/05/2013 on arrival  Rate: 137   Rhythm: atrial fibrillation with RVR  QRS Axis: left  Intervals: normal  ST/T Wave abnormalities: nonspecific ST/T changes  Conduction Disutrbances:none  Narrative Interpretation:   Old EKG Reviewed: changes noted; afib rate is faster compared with previous EKG dated 05/26/2013.  Results for orders placed during the hospital encounter of 65/68/12  BASIC METABOLIC PANEL      Result Value Ref Range   Sodium 136 (*) 137 - 147 mEq/L   Potassium 4.7  3.7 - 5.3 mEq/L   Chloride 98  96 - 112 mEq/L   CO2 21  19 - 32 mEq/L   Glucose, Bld 141 (*) 70 - 99 mg/dL   BUN 19  6 - 23 mg/dL   Creatinine, Ser 1.24  0.50 - 1.35 mg/dL   Calcium 9.3  8.4 - 10.5 mg/dL   GFR calc non Af Amer 53 (*) >90 mL/min   GFR calc Af Amer 62 (*) >90 mL/min  CBC WITH DIFFERENTIAL      Result Value Ref Range   WBC 8.4  4.0 - 10.5 K/uL   RBC 4.32  4.22 - 5.81 MIL/uL   Hemoglobin 14.1  13.0 - 17.0 g/dL   HCT 42.4  39.0 - 52.0 %   MCV 98.1  78.0 - 100.0 fL   MCH 32.6  26.0 - 34.0 pg   MCHC 33.3  30.0 - 36.0 g/dL   RDW 16.3 (*) 11.5 - 15.5 %   Platelets 285  150 - 400 K/uL   Neutrophils Relative % 84 (*) 43 - 77 %   Neutro Abs 7.0  1.7 - 7.7 K/uL   Lymphocytes Relative 10 (*) 12 - 46 %   Lymphs Abs 0.8  0.7 - 4.0 K/uL   Monocytes Relative 6  3 - 12 %   Monocytes Absolute 0.5  0.1 - 1.0 K/uL    Eosinophils Relative 1  0 - 5 %   Eosinophils Absolute 0.1  0.0 - 0.7 K/uL   Basophils Relative 0  0 - 1 %   Basophils Absolute 0.0  0.0 - 0.1 K/uL  TROPONIN I      Result Value Ref Range   Troponin I <0.30  <0.30 ng/mL  PRO B NATRIURETIC PEPTIDE      Result Value Ref Range   Pro B Natriuretic peptide (BNP) 16211.0 (*) 0 - 450 pg/mL   Dg Chest Port 1 View 09/05/2013   CLINICAL DATA:  Short of breath  EXAM: PORTABLE CHEST - 1 VIEW  COMPARISON:  08/28/2013  FINDINGS: Worsening congestive heart failure with edema and bilateral effusions. Hypoventilation with bibasilar atelectasis.  IMPRESSION: Worsening congestive heart failure.   Electronically Signed   By: Franchot Gallo M.D.   On: 09/05/2013 11:39    1110:  On arrival, pt's monitor with afib/RVR, rates 140-150's. Will dose IV cardizem bolus and gtt.  1145:  HR steadily decreasing while on cardizem gtt. SBP remains stable. Pt continues to deny CP/SOB while sitting on stretcher.  1210:  Monitor HR 80-90's after IV cardizem bolus and gtt. BNP elevated with acute CHF on CXR; will dose IV lasix.  Dx and testing d/w pt and friend.  Questions answered.  Verb understanding, agreeable to admit.  T/C to [facility]. Broadus John, case discussed, including:  HPI, pertinent PM/SHx, VS/PE, dx testing, ED course and treatment:  Agreeable to admit, requests to write temporary orders, obtain inpatient tele bed to team 2.        Alfonzo Feller, DO 09/07/13 1536

## 2013-09-05 NOTE — Progress Notes (Signed)
INITIAL NUTRITION ASSESSMENT  DOCUMENTATION CODES Per approved criteria  -Non-severe (moderate) malnutrition in the context of chronic illness   Pt meets criteria for moderate MALNUTRITION in the context of chronic illness as evidenced by 10% wt loss x 6 months, <75% energy intake x 1 month, and moderate muscle depletion.  INTERVENTION: Resource Breeze po TID, each supplement provides 250 kcal and 9 grams of protein  NUTRITION DIAGNOSIS: Inadequate oral intake related to poor po intake, early satiety as evidenced by pt hx, 10% wt loss x 6 months.   Goal: Pt will meet >90% of estimated nutritional needs  Monitor:  PO intake, labs, skin assessments, weight changes, I/O's  Reason for Assessment: MST=3  80 y.o. male  Admitting Dx: Acute on chronic systolic CHF (congestive heart failure), NYHA class 3  ASSESSMENT: Curtis Lang is a very pleasant gentleman who was admitted for SOB and CHF exacerbation. Significant other and pt provided hx at bedside. Both confirm poor po intake and wt loss x 3 months. RN confirms weight loss and poor appetite, due to early satiety.  Curtis Lang reports UBW of 170-175#; he reports "I used to eat really well, but now I don't". Significant other reports declining appetite over the past 2-3 months, reporting he eats "practically nothing". She reports she had purchased him Boost about 1-2 months ago, however, pt only drank 1/2 bottle because he did not like the taste. Noted that lunch tray was minimally completed (less than 25%); pt ate a few bites of salmon patty and the green beans and baked potato were untouched. He reports intermittent problems swallowing, but cannot elaborate further. He and significant other report main issue is poor appetite, rather than swallowing issues.  Home diet consists 50% of cooked meals (from significant other) and 50% eating out at fast food establishments, such as [place].  Wt hx reveals 14# (105) wt loss x 6 months, which is  clinically significant. Also noted a 12# (7.1%) wt loss x 3 months, which is not clinically significant. Pt is open to trying nutritional supplements, although would prefer a different alternative to Boost or Ensure. After describing options, pt is most agreeable to [employer].  Nutrition Focused Physical Exam:  Subcutaneous Fat:  Orbital Region: WDL Upper Arm Region: moderate depletion Thoracic and Lumbar Region: WDL  Muscle:  Temple Region: WDL Clavicle Bone Region: WDL Clavicle and Acromion Bone Region: WDL Scapular Bone Region: WDL Dorsal Hand: moderate depletion Patellar Region: moderate depletion Anterior Thigh Region: moderate depletion Posterior Calf Region: WDL  Edema: none present   Height: Ht Readings from Last 1 Encounters:  09/05/13 5\' 7"  (1.702 m)    Weight: Wt Readings from Last 1 Encounters:  09/05/13 156 lb 4.9 oz (70.9 kg)    Ideal Body Weight: 148#  % Ideal Body Weight: 105%  Wt Readings from Last 10 Encounters:  09/05/13 156 lb 4.9 oz (70.9 kg)  05/27/13 168 lb (76.204 kg)  05/17/13 162 lb 4.1 oz (73.6 kg)  04/22/13 165 lb (74.844 kg)  03/20/13 170 lb 9.6 oz (77.384 kg)  03/07/13 170 lb (77.111 kg)  03/05/13 170 lb 9.6 oz (77.384 kg)  03/11/12 173 lb (78.472 kg)  01/29/12 171 lb (77.565 kg)  08/13/11 170 lb (77.111 kg)    Usual Body Weight: 175#  % Usual Body Weight: 89%  BMI:  Body mass index is 24.48 kg/(m^2). Meets criteria for normal weight.   Estimated Nutritional Needs: Kcal: 2585-2778 daily Protein: 71-89 grams daily Fluid: 1.8-2.1 L daily  Skin: WDL  Diet Order: Cardiac  EDUCATION NEEDS: -Education needs addressed   Intake/Output Summary (Last 24 hours) at 09/05/13 1635 Last data filed at 09/05/13 1500  Gross per 24 hour  Intake      0 ml  Output   1300 ml  Net  -1300 ml    Last BM:  09/05/13  Labs:   Recent Labs Lab 09/05/13 1118  NA 136*  K 4.7  CL 98  CO2 21  BUN 19  CREATININE 1.24  CALCIUM  9.3  GLUCOSE 141*    CBG (last 3)  No results found for this basename: GLUCAP,  in the last 72 hours  Scheduled Meds: . apixaban  5 mg Oral BID  . atorvastatin  40 mg Oral q1800  . furosemide  60 mg Intravenous Q12H  . lisinopril  10 mg Oral Daily  . metoprolol succinate  100 mg Oral Daily  . pantoprazole  40 mg Oral Daily  . potassium chloride  20 mEq Oral BID    Continuous Infusions: . diltiazem (CARDIZEM) infusion 7 mg/hr (09/05/13 1535)    Past Medical History  Diagnosis Date  . Arteriosclerotic cardiovascular disease (ASCVD)     BMS to RCA 1997; cutting ballon for RCA restenosis in 2001; cath 12/10 - 100% RCA L->R collaterals; nl EF; 40% LAD and OM1  . Hypertension   . Hyperlipidemia   . Cerebrovascular disease     bilateral bruits; duplex in 2009 - mild plaque w/o stenosis  . Peripheral vascular disease     ABI on 0.87 on left  . Tobacco abuse, in remission     60 pack years; discontinued in 2003  . COPD (chronic obstructive pulmonary disease)   . Fasting hyperglycemia   . DDD (degenerative disc disease), cervical   . Muscle discomfort     No improvement after statins temporary discontinued  . Hypercholesteremia   . Lymphoma     chemotherapy and radiation therapy in 2006; recurrence in 2007  . Squamous cell carcinoma     s/p Mohs surgery  . Cancer     b cell lymphoma  . Diffuse large B cell lymphoma 03/18/2010    Past Surgical History  Procedure Laterality Date  . Anterior fusion cervical spine  2003    posterior?  . Inguinal hernia repair  1996    Right  . Orif femur fracture  1996  . Mohs surgery    . Colonoscopy  05/16/2011    Procedure: COLONOSCOPY;  Surgeon: Jamesetta So; diverticulosis, sigmoid polypectomy  . Port-a-cath removal Right 03/10/2013    Procedure: REMOVAL PORT-A-CATH;  Surgeon: Scherry Ran, MD;  Location: [facility];  Service: General;  Laterality: Right;    Curtis Lang, RD, LDN Pager: [phone]

## 2013-09-06 DIAGNOSIS — E440 Moderate protein-calorie malnutrition: Secondary | ICD-10-CM | POA: Insufficient documentation

## 2013-09-06 DIAGNOSIS — J449 Chronic obstructive pulmonary disease, unspecified: Secondary | ICD-10-CM

## 2013-09-06 DIAGNOSIS — I10 Essential (primary) hypertension: Secondary | ICD-10-CM

## 2013-09-06 DIAGNOSIS — I5023 Acute on chronic systolic (congestive) heart failure: Secondary | ICD-10-CM

## 2013-09-06 DIAGNOSIS — I4891 Unspecified atrial fibrillation: Secondary | ICD-10-CM

## 2013-09-06 LAB — COMPREHENSIVE METABOLIC PANEL
ALBUMIN: 3.4 g/dL — AB (ref 3.5–5.2)
ALT: 34 U/L (ref 0–53)
AST: 41 U/L — AB (ref 0–37)
Alkaline Phosphatase: 89 U/L (ref 39–117)
BUN: 19 mg/dL (ref 6–23)
CALCIUM: 9.3 mg/dL (ref 8.4–10.5)
CHLORIDE: 98 meq/L (ref 96–112)
CO2: 24 meq/L (ref 19–32)
Creatinine, Ser: 1.24 mg/dL (ref 0.50–1.35)
GFR calc Af Amer: 62 mL/min — ABNORMAL LOW (ref >90)
GFR, EST NON AFRICAN AMERICAN: 53 mL/min — AB (ref >90)
Glucose, Bld: 143 mg/dL — ABNORMAL HIGH (ref 70–99)
Potassium: 4.3 mEq/L (ref 3.7–5.3)
SODIUM: 139 meq/L (ref 137–147)
Total Bilirubin: 0.7 mg/dL (ref 0.3–1.2)
Total Protein: 7.4 g/dL (ref 6.0–8.3)

## 2013-09-06 NOTE — Progress Notes (Signed)
[facility] PROGRESS NOTE  Curtis Lang [id number]:[id number] DOB: 02-Jul-1930 DOA: 09/05/2013 PCP: Purvis Kilts, MD  Assessment/Plan:  Acute on chronic systolic CHF  -improving  -continue diuresis with IV lasix 60mg  Q12  -last ECHO 12/14, EF of 25%  -I/Os, daily weights  -continue Toprol, Lisinopril  -Question compliance with med regimen/diet due to Dementia   Afib with RVR -rate improved  -wean off diltiazem gtt -resume Toprol  -continue Apixaban   Diffuse Large B cell lymphoma  -reportedly in remission per Pt and friend   Dementia  -supposedly on aricept, will ask Pharmacy to confirm   Hypertension  -continue toprol and lisinopril  COPD  -stable   DVT proph: on anticoagulation with apixaban   Code Status: DNR, per pt wishes  Family Communication: d/w pt and friend at bedside yesterday Disposition Plan: tx to tele    HPI/Subjective: Feels better  Objective: Filed Vitals:   09/06/13 1400  BP: 103/68  Pulse: 65  Temp:   Resp: 28    Intake/Output Summary (Last 24 hours) at 09/06/13 1537 Last data filed at 09/06/13 1400  Gross per 24 hour  Intake    320 ml  Output   2850 ml  Net  -2530 ml   Filed Weights   09/05/13 1145 09/05/13 1424 09/06/13 0800  Weight: 72.576 kg (160 lb) 70.9 kg (156 lb 4.9 oz) 69.9 kg (154 lb 1.6 oz)    Exam:   General:  AAOx2  Cardiovascular: S1S2/Irregular rate and rhythm  Respiratory: Fine basilar crackles  Abdomen: soft, Nt, BS present  Musculoskeletal: trace edema c/c  Data Reviewed: Basic Metabolic Panel:  Recent Labs Lab 09/05/13 1118 09/06/13 0454  NA 136* 139  K 4.7 4.3  CL 98 98  CO2 21 24  GLUCOSE 141* 143*  BUN 19 19  CREATININE 1.24 1.24  CALCIUM 9.3 9.3   Liver Function Tests:  Recent Labs Lab 09/06/13 0454  AST 41*  ALT 34  ALKPHOS 89  BILITOT 0.7  PROT 7.4  ALBUMIN 3.4*   No results found for this basename: LIPASE, AMYLASE,  in the last 168 hours No results found for  this basename: AMMONIA,  in the last 168 hours CBC:  Recent Labs Lab 09/05/13 1118  WBC 8.4  NEUTROABS 7.0  HGB 14.1  HCT 42.4  MCV 98.1  PLT 285   Cardiac Enzymes:  Recent Labs Lab 09/05/13 1118  TROPONINI <0.30   BNP (last 3 results)  Recent Labs  05/15/13 1415 05/15/13 2042 09/05/13 1118  PROBNP 1558.0* 1687.0* 16211.0*   CBG: No results found for this basename: GLUCAP,  in the last 168 hours  Recent Results (from the past 240 hour(s))  MRSA PCR SCREENING     Status: None   Collection Time    09/05/13  2:10 PM      Result Value Ref Range Status   MRSA by PCR NEGATIVE  NEGATIVE Final   Comment:            The GeneXpert MRSA Assay (FDA     approved for NASAL specimens     only), is one component of a     comprehensive MRSA colonization     surveillance program. It is not     intended to diagnose MRSA     infection nor to guide or     monitor treatment for     MRSA infections.     Studies: Dg Chest Port 1 View  09/05/2013   CLINICAL  DATA:  Short of breath  EXAM: PORTABLE CHEST - 1 VIEW  COMPARISON:  08/28/2013  FINDINGS: Worsening congestive heart failure with edema and bilateral effusions. Hypoventilation with bibasilar atelectasis.  IMPRESSION: Worsening congestive heart failure.   Electronically Signed   By: Franchot Gallo M.D.   On: 09/05/2013 11:39    Scheduled Meds: . apixaban  5 mg Oral BID  . atorvastatin  40 mg Oral q1800  . feeding supplement (RESOURCE BREEZE)  1 Container Oral TID BM  . furosemide  60 mg Intravenous Q12H  . lisinopril  10 mg Oral Daily  . metoprolol succinate  100 mg Oral Daily  . pantoprazole  40 mg Oral Daily  . potassium chloride  20 mEq Oral BID   Continuous Infusions:  Antibiotics Given (last 72 hours)   None      Principal Problem:   Acute on chronic systolic CHF (congestive heart failure), NYHA class 3 Active Problems:   Diffuse large B cell lymphoma   HYPERTENSION   COPD   Atrial fibrillation with rapid  ventricular response   Dementia   CHF (congestive heart failure)   Malnutrition of moderate degree    Time spent:40min    [facility]  [facility] Pager [phone]. If 7PM-7AM, please contact night-coverage at [email or web address], password [facility] 09/06/2013, 3:37 PM  LOS: 1 day

## 2013-09-07 LAB — BASIC METABOLIC PANEL
BUN: 19 mg/dL (ref 6–23)
CO2: 27 mEq/L (ref 19–32)
CREATININE: 1.12 mg/dL (ref 0.50–1.35)
Calcium: 9.8 mg/dL (ref 8.4–10.5)
Chloride: 95 mEq/L — ABNORMAL LOW (ref 96–112)
GFR calc non Af Amer: 60 mL/min — ABNORMAL LOW (ref >90)
GFR, EST AFRICAN AMERICAN: 70 mL/min — AB (ref >90)
Glucose, Bld: 116 mg/dL — ABNORMAL HIGH (ref 70–99)
Potassium: 3.9 mEq/L (ref 3.7–5.3)
Sodium: 138 mEq/L (ref 137–147)

## 2013-09-07 MED ORDER — FUROSEMIDE 10 MG/ML IJ SOLN
40.0000 mg | Freq: Two times a day (BID) | INTRAMUSCULAR | Status: DC
  Administered 2013-09-07 (×2): 40 mg via INTRAVENOUS
  Filled 2013-09-07 (×2): qty 4

## 2013-09-07 MED ORDER — DONEPEZIL HCL 5 MG PO TABS
5.0000 mg | ORAL_TABLET | Freq: Every day | ORAL | Status: DC
  Administered 2013-09-07 – 2013-09-08 (×2): 5 mg via ORAL
  Filled 2013-09-07 (×2): qty 1

## 2013-09-07 MED ORDER — HALOPERIDOL LACTATE 5 MG/ML IJ SOLN: 1.0000 mg | Freq: Four times a day (QID) | INTRAMUSCULAR | Status: DC | PRN

## 2013-09-07 MED ORDER — LORAZEPAM 2 MG/ML IJ SOLN
0.5000 mg | Freq: Once | INTRAMUSCULAR | Status: AC
  Administered 2013-09-07: 0.5 mg via INTRAVENOUS
  Filled 2013-09-07: qty 1

## 2013-09-07 NOTE — Progress Notes (Addendum)
[facility] PROGRESS NOTE  Curtis Lang [id number]:[id number] DOB: 04-May-1931 DOA: 09/05/2013 PCP: Purvis Kilts, MD  Assessment/Plan:  Acute on chronic systolic CHF  -improving  -continue diuresis, cut down lasix  To 40mg  Q12  -last ECHO 12/14, EF of 25%  -4.4L negative, weight down 5kg  -continue Toprol, Lisinopril  -Question compliance with med regimen/diet due to Dementia   Afib with RVR -rate improved  -wean off diltiazem gtt -resume Toprol  -continue Apixaban   Diffuse Large B cell lymphoma  -reportedly in remission per Pt and friend   Dementia with some sundowning overnight -resume Aricept QHS -if recurs may need to consider low dose antipsychotics Qpm  Hypertension  -continue toprol and lisinopril   COPD  -stable   DVT proph: on anticoagulation with apixaban   Code Status: DNR, per pt wishes  Family Communication: d/w nephew Disposition Plan: ideally needs ALF, d/w nephew    HPI/Subjective: Agitated overnight given Ativan earlier today  Objective: Filed Vitals:   09/07/13 0432  BP: 113/64  Pulse: 67  Temp: 97.3 F (36.3 C)  Resp: 20    Intake/Output Summary (Last 24 hours) at 09/07/13 1148 Last data filed at 09/07/13 0903  Gross per 24 hour  Intake   1052 ml  Output   2705 ml  Net  -1653 ml   Filed Weights   09/05/13 1424 09/06/13 0800 09/07/13 0432  Weight: 70.9 kg (156 lb 4.9 oz) 69.9 kg (154 lb 1.6 oz) 67.8 kg (149 lb 7.6 oz)    Exam:   General: sleepy, arousible  Cardiovascular: S1S2/Irregular rate and rhythm  Respiratory: Fine basilar crackles  Abdomen: soft, Nt, BS present  Musculoskeletal: trace edema c/c  Data Reviewed: Basic Metabolic Panel:  Recent Labs Lab 09/05/13 1118 09/06/13 0454 09/07/13 0614  NA 136* 139 138  K 4.7 4.3 3.9  CL 98 98 95*  CO2 21 24 27   GLUCOSE 141* 143* 116*  BUN 19 19 19   CREATININE 1.24 1.24 1.12  CALCIUM 9.3 9.3 9.8   Liver Function Tests:  Recent Labs Lab  09/06/13 0454  AST 41*  ALT 34  ALKPHOS 89  BILITOT 0.7  PROT 7.4  ALBUMIN 3.4*   No results found for this basename: LIPASE, AMYLASE,  in the last 168 hours No results found for this basename: AMMONIA,  in the last 168 hours CBC:  Recent Labs Lab 09/05/13 1118  WBC 8.4  NEUTROABS 7.0  HGB 14.1  HCT 42.4  MCV 98.1  PLT 285   Cardiac Enzymes:  Recent Labs Lab 09/05/13 1118  TROPONINI <0.30   BNP (last 3 results)  Recent Labs  05/15/13 1415 05/15/13 2042 09/05/13 1118  PROBNP 1558.0* 1687.0* 16211.0*   CBG: No results found for this basename: GLUCAP,  in the last 168 hours  Recent Results (from the past 240 hour(s))  MRSA PCR SCREENING     Status: None   Collection Time    09/05/13  2:10 PM      Result Value Ref Range Status   MRSA by PCR NEGATIVE  NEGATIVE Final   Comment:            The GeneXpert MRSA Assay (FDA     approved for NASAL specimens     only), is one component of a     comprehensive MRSA colonization     surveillance program. It is not     intended to diagnose MRSA     infection nor to guide or  monitor treatment for     MRSA infections.     Studies: No results found.  Scheduled Meds: . apixaban  5 mg Oral BID  . atorvastatin  40 mg Oral q1800  . feeding supplement (RESOURCE BREEZE)  1 Container Oral TID BM  . furosemide  40 mg Intravenous Q12H  . lisinopril  10 mg Oral Daily  . metoprolol succinate  100 mg Oral Daily  . pantoprazole  40 mg Oral Daily  . potassium chloride  20 mEq Oral BID   Continuous Infusions:  Antibiotics Given (last 72 hours)   None      Principal Problem:   Acute on chronic systolic CHF (congestive heart failure), NYHA class 3 Active Problems:   Diffuse large B cell lymphoma   HYPERTENSION   COPD   Atrial fibrillation with rapid ventricular response   Dementia   CHF (congestive heart failure)   Malnutrition of moderate degree    Time spent:21min    [facility]  [facility] Pager [phone]. If 7PM-7AM, please contact night-coverage at [email or web address], password [facility] 09/07/2013, 11:48 AM  LOS: 2 days

## 2013-09-07 NOTE — Progress Notes (Signed)
Pt agitated and saying that he is going home. Attempting to get out of bed. Confused to time and place. Attempted to reorient pt. Dr. [place] Pink called and orders given for Ativan 0.5mg  IV x 1 dose. Ativan given and will continue to monitor pt.

## 2013-09-08 DIAGNOSIS — I679 Cerebrovascular disease, unspecified: Secondary | ICD-10-CM

## 2013-09-08 LAB — CBC
HEMATOCRIT: 47.3 % (ref 39.0–52.0)
Hemoglobin: 15.6 g/dL (ref 13.0–17.0)
MCH: 32.5 pg (ref 26.0–34.0)
MCHC: 33 g/dL (ref 30.0–36.0)
MCV: 98.5 fL (ref 78.0–100.0)
Platelets: 257 10*3/uL (ref 150–400)
RBC: 4.8 MIL/uL (ref 4.22–5.81)
RDW: 16.1 % — AB (ref 11.5–15.5)
WBC: 7.8 10*3/uL (ref 4.0–10.5)

## 2013-09-08 LAB — BASIC METABOLIC PANEL
BUN: 18 mg/dL (ref 6–23)
CO2: 31 mEq/L (ref 19–32)
Calcium: 9.2 mg/dL (ref 8.4–10.5)
Chloride: 97 mEq/L (ref 96–112)
Creatinine, Ser: 1.14 mg/dL (ref 0.50–1.35)
GFR calc Af Amer: 68 mL/min — ABNORMAL LOW (ref >90)
GFR, EST NON AFRICAN AMERICAN: 59 mL/min — AB (ref >90)
Glucose, Bld: 117 mg/dL — ABNORMAL HIGH (ref 70–99)
POTASSIUM: 3.9 meq/L (ref 3.7–5.3)
Sodium: 142 mEq/L (ref 137–147)

## 2013-09-08 MED ORDER — FUROSEMIDE 40 MG PO TABS
40.0000 mg | ORAL_TABLET | Freq: Every day | ORAL | Status: DC
  Administered 2013-09-08 – 2013-09-09 (×2): 40 mg via ORAL
  Filled 2013-09-08 (×2): qty 1

## 2013-09-08 NOTE — Clinical Social Work Placement (Signed)
Clinical Social Work Department CLINICAL SOCIAL WORK PLACEMENT NOTE 09/08/2013  Patient:  Curtis Lang, Curtis Lang  Account Number:  [id number] Admit date:  09/05/2013  Clinical Social Worker:  Benay Pike, LCSW  Date/time:  09/08/2013 11:13 AM  Clinical Social Work is seeking post-discharge placement for this patient at the following level of care:   SKILLED NURSING   (*CSW will update this form in Epic as items are completed)   09/08/2013  Patient/family provided with [place] Department of Clinical Social Work's list of facilities offering this level of care within the geographic area requested by the patient (or if unable, by the patient's family).  09/08/2013  Patient/family informed of their freedom to choose among providers that offer the needed level of care, that participate in Medicare, Medicaid or managed care program needed by the patient, have an available bed and are willing to accept the patient.  09/08/2013  Patient/family informed of [facility]' ownership interest in [facility], as well as of the fact that they are under no obligation to receive care at this facility.  PASARR submitted to EDS on 09/08/2013 PASARR number received from E[employer] on 09/08/2013  FL2 transmitted to all facilities in geographic area requested by pt/family on  09/08/2013 FL2 transmitted to all facilities within larger geographic area on   Patient informed that his/her managed care company has contracts with or will negotiate with  certain facilities, including the following:     Patient/family informed of bed offers received:   Patient chooses bed at  Physician recommends and patient chooses bed at    Patient to be transferred to  on   Patient to be transferred to facility by   The following physician request were entered in Epic:   Additional Comments:  Benay Pike, [place]

## 2013-09-08 NOTE — Clinical Social Work Psychosocial (Signed)
Clinical Social Work Department BRIEF PSYCHOSOCIAL ASSESSMENT 09/08/2013  Patient:  Curtis Lang, Curtis Lang     Account Number:  [id number]     Admit date:  09/05/2013  Clinical Social Worker:  Wyatt Haste  Date/Time:  09/08/2013 11:37 AM  Referred by:  Physician  Date Referred:  09/08/2013 Referred for  SNF Placement   Other Referral:   Interview type:  Patient Other interview type:   nephew- Curtis Lang  voicemail left for [facility] Status:  ALONE Admitted from facility:   Level of care:   Primary support name:  Curtis Lang Primary support relationship to patient:  FAMILY Degree of support available:   supportive    CURRENT CONCERNS Current Concerns  Post-Acute Placement   Other Concerns:    SOCIAL WORK ASSESSMENT / PLAN CSW met with pt at bedside. Pt very pleasant, but confused. He is not sure where he is currently at and is asking for money. CSW called pt's nephew Curtis Lang and left voicemail on his cell phone. Called the house phone and his wife answered. She is not sure if Curtis Lang is HCPOA but he is most involved. States Curtis Lang is at work today, but should be able to return call later. CSW called pt's nephew Curtis Lang who reports he lives just down the road and sees pt regularly. At baseline, Curtis Lang indicates pt is fairly independent. He is still driving, although admits that he probably should not be. He has some confusion at baseline per nephew. Pt's daughter lives in [place] but is moving back here in several weeks. PT evaluated pt today and recommendation is for SNF. CSW discussed placement process and he is agreeable to fax out to [facility] facilities while awaiting return call from [place]. SNF list left in room.   Assessment/plan status:  Psychosocial Support/Ongoing Assessment of Needs Other assessment/ plan:   Information/referral to community resources:   SNF list    PATIENT'S/FAMILY'S RESPONSE TO PLAN OF CARE: Pt unable to discuss plan of care due to  confusion. Family request SNF at this point and CSW will discuss with nephew Curtis Lang when available. Will follow up with bed offers.       Benay Pike, [place]

## 2013-09-08 NOTE — Progress Notes (Signed)
Patient's B/P 102/67,heart rate 46,Dr Broadus John notified,am metoprolol succinate,and lisinopril held per MD. Will continue to monitor patient.No c/o pain or discomfort noted.

## 2013-09-08 NOTE — Evaluation (Signed)
Occupational Therapy Evaluation Patient Details Name: Curtis Lang MRN: [id number] DOB: 10/24/1930 Today's Date: 09/08/2013    History of Present Illness Curtis Lang is a 80 y.o. male with PMH of Chronic Systolic CHF/Ischemic cardiomyopathy, EF of 25%, Afib recently started on Apixaban, Dementia, H/o Diffuse large B cell lymphoma presents to the ER today with the below complaints. Pt reports progressive dyspnea on exertion for past week to 10days, with some Orthopnea and leg swelling. Pt unclear on his medications and his friend reports that due to his dementia she thinks that he gets his medications mixed up and not sure if he is compliant with his diet as he lives alone.   Clinical Impression   Pt is presenting to acute OT with above situation.  He is currently presenting near baseline according to friends present.  Pt is physically able to complete all ADL tasks, but will benefit from increased supervision after his d/c from SNF. Pt does not need OT services at this time, but recommend daughter request OT consult from SNF prior to d/c if she has questions/concerns about d/c home.    Follow Up Recommendations  No OT follow up (No OT required at this time.  If daughter has concerns about eventual d/c home, reccommend OT consult through current d/c SNF. prior to return home.)    Equipment Recommendations  None recommended by OT    Recommendations for Other Services       Precautions / Restrictions Precautions Precautions: Fall Restrictions Weight Bearing Restrictions: No      Mobility Bed Mobility                  Transfers                      Balance                                    ADL                     General ADL Comments: Pt is at baseline, but current dementia stage prevents pt from being able to be safe at home alone.      Vision                     Perception     Praxis      Pertinent Vitals/Pain Pt  denies pain     Hand Dominance     Extremity/Trunk Assessment Upper Extremity Assessment Upper Extremity Assessment: Overall WFL for tasks assessed   Lower Extremity Assessment Lower Extremity Assessment: Defer to PT evaluation       Communication Communication Communication: No difficulties   Cognition Arousal/Alertness: Awake/alert Behavior During Therapy: WFL for tasks assessed/performed Overall Cognitive Status: History of cognitive impairments - at baseline (Pt has dementia)                     General Comments       Exercises      Home Living Family/patient expects to be discharged to:: Skilled nursing facility Living Arrangements: Alone;Children (Pt's daughter will be moving to live with pt at end of April)                                      Prior  Functioning/Environment Level of Independence: Independent        Comments: Pt was independent prior, living alone, and driving to restaurants to eat every meal.  Pt's dementia has progressed so that he would benefit from having increased supervision.    OT Diagnosis:     OT Problem List:     OT Treatment/Interventions:      OT Goals(Current goals can be found in the care plan section) Acute Rehab OT Goals Patient Stated Goal: Pt d/c-ed from OT services  OT Frequency:     Barriers to D/C:            End of Session:    Activity Tolerance: Patient tolerated treatment well Patient left: in chair;with call bell/phone within reach;with chair alarm set;with family/visitor present   Time: 5625-6389 OT Time Calculation (min): 27 min Charges:  OT General Charges $OT Visit: 1 Procedure OT Evaluation $Initial OT Evaluation Tier I: 1 Procedure G-Codes:      Bea Graff, MS, OTR/L [phone]  09/08/2013, 4:34 PM

## 2013-09-08 NOTE — Evaluation (Signed)
Physical Therapy Evaluation Patient Details Name: Curtis Lang MRN: [id number] DOB: 08-09-1930 Today's Date: 09/08/2013   History of Present Illness  Curtis Lang is a 80 y.o. male with PMH of Chronic Systolic CHF/Ischemic cardiomyopathy, EF of 25%, Afib recently started on Apixaban, Dementia, H/o Diffuse large B cell lymphoma presents to the ER today with the below complaints. Pt reports progressive dyspnea on exertion for past week to 10days, with some Orthopnea and leg swelling. Pt unclear on his medications and his friend reports that due to his dementia she thinks that he gets his medications mixed up and not sure if he is compliant with his diet as he lives alone.  Clinical Impression  Patient displays mild to moderate cognitive impairment secondary to difficulty to learn and aptient talking in circles and overly repeating himself; as well as repetitively asking the same questions. Patient is independent with all ed mobility but required min assist with transfers and gait secondary to limited balance placing patient at increased falls risk resulting in patient bing unsafe to discharge home. Patient would be appropriate for skilled nursing facility placement to improve strength, balance and endure so that patient may eventually return home with occasional supervision.     Follow Up Recommendations SNF    Equipment Recommendations  Rolling walker with 5" wheels (unsure of currnet equipment due to patient being a poor historian)       Precautions / Restrictions Precautions Precautions: Fall Precaution Comments: secondary to weakness      Mobility  Bed Mobility Overal bed mobility: Independent     Transfers Overall transfer level: Needs assistance Equipment used: Rolling walker (2 wheeled) Transfers: Sit to/from Stand Sit to Stand: Min assist    General transfer comment: assistance required to prevent loss of balance.   Ambulation/Gait Ambulation/Gait assistance: Min  assist Ambulation Distance (Feet): 130 Feet Assistive device: Rolling walker (2 wheeled) Gait Pattern/deviations: Decreased stride length;Decreased weight shift to right   Gait velocity interpretation: Below normal speed for age/gender    Stairs            Wheelchair Mobility    Modified Rankin (Stroke Patients Only)       Balance Overall balance assessment: Needs assistance Sitting-balance support: Single extremity supported Sitting balance-Leahy Scale: Poor Sitting balance - Comments: limited balance secondary to weakenss Postural control: Posterior lean Standing balance support: Bilateral upper extremity supported Standing balance-Leahy Scale: Poor Standing balance comment: Patient requires min assist to prevenet loss of balance and fall.                      Pertinent Vitals/Pain No pain    Home Living Family/patient expects to be discharged to:: Private residence Living Arrangements: Alone Available Help at Discharge: Other (Comment) (unsure secondary to atient being poor historian and no family present)             Additional Comments: unable to determine rest of home status due to patient being a poor historian secondary to dementia.              Extremity/Trunk Assessment               Lower Extremity Assessment: Overall WFL for tasks assessed            Cognition Arousal/Alertness: Awake/alert Behavior During Therapy: WFL for tasks assessed/performed Overall Cognitive Status: No family/caregiver present to determine baseline cognitive functioning       Memory: Decreased recall of precautions;Decreased short-term memory  General Comments      Exercises        Assessment/Plan    PT Assessment Patient needs continued PT services  PT Diagnosis Difficulty walking;Generalized weakness   PT Problem List Decreased strength;Decreased balance;Decreased mobility  PT Treatment Interventions Gait  training;Functional mobility training;Therapeutic exercise;Therapeutic activities;Balance training   PT Goals (Current goals can be found in the Care Plan section) Acute Rehab PT Goals Patient Stated Goal: to go home  PT Goal Formulation: With patient Time For Goal Achievement: 09/15/13 Potential to Achieve Goals: Fair    Frequency Min 3X/week   Barriers to discharge Decreased caregiver support Expecting patient to be able to DC home after a skilled nursing facility stay to increase his strength and balance.     End of Session Equipment Utilized During Treatment: Gait belt Activity Tolerance: Patient tolerated treatment well Patient left: in chair;with call bell/phone within reach;with chair alarm set         Time: 0830-0900 PT Time Calculation (min): 30 min   Charges:   PT Evaluation $Initial PT Evaluation Tier I: 1 Procedure PT Treatments $Gait Training: 8-22 mins   PT G Codes:          Kitara Hebb R PT DPT 09/08/2013, 10:11 AM

## 2013-09-08 NOTE — Progress Notes (Signed)
[facility] PROGRESS NOTE  Curtis Lang ULA:[id number] DOB: September 10, 1930 DOA: 09/05/2013 PCP: Purvis Kilts, MD  Assessment/Plan:  Acute on chronic systolic CHF  -improving  -continue diuresis, will cut down lasix to PO -BP soft this am -last ECHO 12/14, EF of 25%  -4.8L negative, weight down 7kg  -continue Toprol, Lisinopril, cut down dose as needed -Question compliance with med regimen/diet due to Dementia   Afib with RVR -rate improved  -weaned off diltiazem gtt -resumed Toprol  -continue Apixaban   Diffuse Large B cell lymphoma  -reportedly in remission per Pt and friend   Dementia with some sundowning overnight -resume Aricept QHS -if recurs may need to consider low dose antipsychotics Qpm -Does not have capacity to make his decisions  Hypertension  -continue toprol and lisinopril   COPD  -stable   DVT proph: on anticoagulation with apixaban   Code Status: DNR, per pt wishes  Family Communication: d/w nephew Disposition Plan:  d/w nephew abt needing SNF, CSW following  HPI/Subjective: Agitated overnight given Ativan earlier today  Objective: Filed Vitals:   09/08/13 1030  BP: 102/67  Pulse: 46  Temp:   Resp:     Intake/Output Summary (Last 24 hours) at 09/08/13 1325 Last data filed at 09/08/13 1026  Gross per 24 hour  Intake   1322 ml  Output   1630 ml  Net   -308 ml   Filed Weights   09/06/13 0800 09/07/13 0432 09/08/13 0619  Weight: 69.9 kg (154 lb 1.6 oz) 67.8 kg (149 lb 7.6 oz) 65.2 kg (143 lb 11.8 oz)    Exam:   General: sleepy, arousible  Cardiovascular: S1S2/Irregular rate and rhythm  Respiratory: Fine basilar crackles  Abdomen: soft, Nt, BS present  Musculoskeletal: trace edema c/c  Data Reviewed: Basic Metabolic Panel:  Recent Labs Lab 09/05/13 1118 09/06/13 0454 09/07/13 0614 09/08/13 0555  NA 136* 139 138 142  K 4.7 4.3 3.9 3.9  CL 98 98 95* 97  CO2 21 24 27 31   GLUCOSE 141* 143* 116* 117*  BUN 19  19 19 18   CREATININE 1.24 1.24 1.12 1.14  CALCIUM 9.3 9.3 9.8 9.2   Liver Function Tests:  Recent Labs Lab 09/06/13 0454  AST 41*  ALT 34  ALKPHOS 89  BILITOT 0.7  PROT 7.4  ALBUMIN 3.4*   No results found for this basename: LIPASE, AMYLASE,  in the last 168 hours No results found for this basename: AMMONIA,  in the last 168 hours CBC:  Recent Labs Lab 09/05/13 1118 09/08/13 0555  WBC 8.4 7.8  NEUTROABS 7.0  --   HGB 14.1 15.6  HCT 42.4 47.3  MCV 98.1 98.5  PLT 285 257   Cardiac Enzymes:  Recent Labs Lab 09/05/13 1118  TROPONINI <0.30   BNP (last 3 results)  Recent Labs  05/15/13 1415 05/15/13 2042 09/05/13 1118  PROBNP 1558.0* 1687.0* 16211.0*   CBG: No results found for this basename: GLUCAP,  in the last 168 hours  Recent Results (from the past 240 hour(s))  MRSA PCR SCREENING     Status: None   Collection Time    09/05/13  2:10 PM      Result Value Ref Range Status   MRSA by PCR NEGATIVE  NEGATIVE Final   Comment:            The GeneXpert MRSA Assay (FDA     approved for NASAL specimens     only), is one component of a  comprehensive MRSA colonization     surveillance program. It is not     intended to diagnose MRSA     infection nor to guide or     monitor treatment for     MRSA infections.     Studies: No results found.  Scheduled Meds: . apixaban  5 mg Oral BID  . atorvastatin  40 mg Oral q1800  . donepezil  5 mg Oral QHS  . feeding supplement (RESOURCE BREEZE)  1 Container Oral TID BM  . furosemide  40 mg Oral Daily  . lisinopril  10 mg Oral Daily  . metoprolol succinate  100 mg Oral Daily  . pantoprazole  40 mg Oral Daily  . potassium chloride  20 mEq Oral BID   Continuous Infusions:  Antibiotics Given (last 72 hours)   None      Principal Problem:   Acute on chronic systolic CHF (congestive heart failure), NYHA class 3 Active Problems:   Diffuse large B cell lymphoma   HYPERTENSION   COPD   Atrial fibrillation  with rapid ventricular response   Dementia   CHF (congestive heart failure)   Malnutrition of moderate degree    Time spent:40min    [facility]  [facility] Pager [phone]. If 7PM-7AM, please contact night-coverage at [email or web address], password [facility] 09/08/2013, 1:25 PM  LOS: 3 days

## 2013-09-08 NOTE — Clinical Social Work Placement (Signed)
Clinical Social Work Department CLINICAL SOCIAL WORK PLACEMENT NOTE 09/08/2013  Patient:  Curtis Lang, Curtis Lang  Account Number:  [id number] Admit date:  09/05/2013  Clinical Social Worker:  Benay Pike, LCSW  Date/time:  09/08/2013 11:13 AM  Clinical Social Work is seeking post-discharge placement for this patient at the following level of care:   SKILLED NURSING   (*CSW will update this form in Epic as items are completed)   09/08/2013  Patient/family provided with [place] Department of Clinical Social Work's list of facilities offering this level of care within the geographic area requested by the patient (or if unable, by the patient's family).  09/08/2013  Patient/family informed of their freedom to choose among providers that offer the needed level of care, that participate in Medicare, Medicaid or managed care program needed by the patient, have an available bed and are willing to accept the patient.  09/08/2013  Patient/family informed of [facility]' ownership interest in [facility], as well as of the fact that they are under no obligation to receive care at this facility.  PASARR submitted to EDS on 09/08/2013 PASARR number received from EDS on 09/08/2013  FL2 transmitted to all facilities in geographic area requested by pt/family on  09/08/2013 FL2 transmitted to all facilities within larger geographic area on   Patient informed that his/her managed care company has contracts with or will negotiate with  certain facilities, including the following:     Patient/family informed of bed offers received:  09/08/2013 Patient chooses bed at [facility] Physician recommends and patient chooses bed at  [facility]  Patient to be transferred to  on   Patient to be transferred to facility by   The following physician request were entered in Epic:   Additional Comments:  Benay Pike, [place]

## 2013-09-08 NOTE — Clinical Social Work Note (Signed)
CSW presented bed offers and pt's daughter chooses [facility]. Facility notified. Awaiting stability for d/c.   Braden Cimo, LCSW [phone] 

## 2013-09-08 NOTE — Care Management Note (Addendum)
    Page 1 of 1   09/09/2013     1:50:35 PM   CARE MANAGEMENT NOTE 09/09/2013  Patient:  Curtis Lang   Account Number:  [id number]  Date Initiated:  09/08/2013  Documentation initiated by:  Theophilus Kinds  Subjective/Objective Assessment:   Pt admitted from home with CHF. Pt lives alone currently and is still driving. Pt is confused at this time. Pt has a daughter in [place] and 2 nephews locally who are very active in the care of the pt.     Action/Plan:   PT is recommending SNF at discharge. Pts daughter is agreeable to [facility]. Pt will only need a couple of weeks at SNF as daughter is preparing to move back to [place] to care for father. CSW is consulted.   Anticipated DC Date:  09/09/2013   Anticipated DC Plan:  SKILLED NURSING FACILITY  In-house referral  Clinical Social Worker      DC Planning Services  CM consult      Choice offered to / List presented to:             Status of service:  Completed, signed off Medicare Important Message given?  YES (If response is "NO", the following Medicare IM given date fields will be blank) Date Medicare IM given:  09/09/2013 Date Additional Medicare IM given:    Discharge Disposition:  [place]  Per UR Regulation:    If discussed at Long Length of Stay Meetings, dates discussed:    Comments:  09/09/13 [place], RN BSN CM Pt discharged to [facility] today. CSw to arrange discharge to facility.  09/08/13 [place], RN BSN CM

## 2013-09-08 NOTE — Clinical Social Work Note (Signed)
CSW spoke with Jonni Sanger and pt's daughter, Butch Penny. Jonni Sanger reports there is no HCPOA so Butch Penny will be Media planner. Butch Penny reports pt has been to [facility] in the past. [place] explained placement process again and they are both agreeable. Butch Penny states she will be home in a few weeks from [facility] and will try to take pt home and be his caregiver. CSW will follow up with bed offers. Jonni Sanger is willing to sign paperwork at facility.  Benay Pike, [place]

## 2013-09-09 ENCOUNTER — Inpatient Hospital Stay
Admission: RE | Admit: 2013-09-09 | Discharge: 2013-10-04 | Disposition: A | Discharge status: Home / Self Care | Payer: Medicare Other | Source: Ambulatory Visit | Attending: Internal Medicine | Admitting: Internal Medicine

## 2013-09-09 DIAGNOSIS — J449 Chronic obstructive pulmonary disease, unspecified: Secondary | ICD-10-CM

## 2013-09-09 LAB — BASIC METABOLIC PANEL
BUN: 25 mg/dL — ABNORMAL HIGH (ref 6–23)
CALCIUM: 9.4 mg/dL (ref 8.4–10.5)
CO2: 30 mEq/L (ref 19–32)
Chloride: 95 mEq/L — ABNORMAL LOW (ref 96–112)
Creatinine, Ser: 1.23 mg/dL (ref 0.50–1.35)
GFR calc non Af Amer: 54 mL/min — ABNORMAL LOW (ref >90)
GFR, EST AFRICAN AMERICAN: 62 mL/min — AB (ref >90)
GLUCOSE: 172 mg/dL — AB (ref 70–99)
POTASSIUM: 4.3 meq/L (ref 3.7–5.3)
SODIUM: 138 meq/L (ref 137–147)

## 2013-09-09 LAB — CBC
HCT: 48.5 % (ref 39.0–52.0)
Hemoglobin: 15.6 g/dL (ref 13.0–17.0)
MCH: 32 pg (ref 26.0–34.0)
MCHC: 32.2 g/dL (ref 30.0–36.0)
MCV: 99.6 fL (ref 78.0–100.0)
Platelets: 279 10*3/uL (ref 150–400)
RBC: 4.87 MIL/uL (ref 4.22–5.81)
RDW: 16.3 % — AB (ref 11.5–15.5)
WBC: 8.4 10*3/uL (ref 4.0–10.5)

## 2013-09-09 MED ORDER — FUROSEMIDE 40 MG PO TABS: 40.0000 mg | ORAL_TABLET | Freq: Every day | ORAL | Status: DC

## 2013-09-09 NOTE — Progress Notes (Signed)
UR chart review completed.  

## 2013-09-09 NOTE — Progress Notes (Signed)
Patient being discharged to [facility] for further treatment.Report called,and given to [place] RN. Vital signs stable. Staff to accompanied  Patient to facility.

## 2013-09-09 NOTE — Discharge Summary (Signed)
Physician Discharge Summary  Curtis Lang EHM:[id number] DOB: [birth year] DOA: 09/05/2013  PCP: Curtis Kilts, MD  Admit date: 09/05/2013 Discharge date: 09/09/2013  Time spent: 45 minutes  Recommendations for Outpatient Follow-up:  1. PCP in 1 week 2. [facility] in 1 week  Discharge Diagnoses:  Principal Problem:   Acute on chronic systolic CHF (congestive heart failure), NYHA class 3 Active Problems:   Diffuse large B cell lymphoma   HYPERTENSION   COPD   Atrial fibrillation with rapid ventricular response   Dementia   CHF (congestive heart failure)   Malnutrition of moderate degree   Discharge Condition: stable  Diet recommendation: low sodium, heart hEALTHY  Filed Weights   09/07/13 0432 09/08/13 0619 09/09/13 0602  Weight: 67.8 kg (149 lb 7.6 oz) 65.2 kg (143 lb 11.8 oz) 64.2 kg (141 lb 8.6 oz)    History of present illness:  Chief Complaint: shortness of breath  HPI: Curtis Lang is a 80 y.o. male with PMH of Chronic Systolic CHF/Ischemic cardiomyopathy, EF of 25%, Afib recently started on Apixaban, Dementia, H/o Diffuse large B cell lymphoma presents to the ER today with the above complaints.  Pt reports progressive dyspnea on exertion for past week to 10days, with some Orthopnea and leg swelling.  Pt unclear on his medications and his friend reports that due to his dementia she thinks that he gets his medications mixed up and not sure if he is compliant with his diet as he lives alone.  In ER, noted to be in heart failure with Afib, RVR and started on a Diltiazem drip  Hospital Course:  Acute on chronic systolic CHF  -improved with aggressive diuresis -changed lasix to PO  -last ECHO 12/14, EF of 25%  -4.8L negative, weight down 8kg  -continue Toprol, Lisinopril, -Question compliance at home with med regimen/diet due to Dementia   Afib with RVR  -rate improved  -was on Diltiazem gtt initially, then weaned off diltiazem -resumed Toprol, rate controlled   -continue Apixaban   Diffuse Large B cell lymphoma  -reportedly in remission per Pt and friend, FU with Oncology  Dementia  -resume Aricept QHS, oriented to self only and at times to place -Does not have capacity to make his decisions   Hypertension  -continue toprol and lisinopril   COPD  -stable    Discharge Exam: Filed Vitals:   09/09/13 0602  BP: 113/71  Pulse:   Temp: 97.4 F (36.3 C)  Resp: 18    General: AAOx to self only, no distress Cardiovascular: S1S2/RRR Respiratory: CTAB  Discharge Instructions  Discharge Orders   Future Appointments Provider Department Dept Phone   10/02/2013 2:30 PM Orvil Feil, NP [facility] [phone]   03/16/2014 9:00 AM [facility] [place] [phone]   03/19/2014 9:30 AM Baird Cancer, PA-C [facility] [phone]   Future Orders Complete By Expires   Diet - low sodium heart healthy  As directed    Increase activity slowly  As directed        Medication List    STOP taking these medications       aspirin 81 MG tablet     naproxen sodium 220 MG tablet  Commonly known as:  ANAPROX      TAKE these medications       apixaban 5 MG Tabs tablet  Commonly known as:  ELIQUIS  Take 1 tablet (5 mg total) by mouth 2 (two) times daily.  donepezil 5 MG tablet  Commonly known as:  ARICEPT  Take 5 mg by mouth at bedtime.     furosemide 40 MG tablet  Commonly known as:  LASIX  Take 1 tablet (40 mg total) by mouth Twice a daily.      lisinopril 20 MG tablet  Commonly known as:  PRINIVIL,ZESTRIL  TAKE 1 TABLET BY MOUTH EVERY DAY     metoprolol succinate 100 MG 24 hr tablet  Commonly known as:  TOPROL-XL  Take 1 tablet (100 mg total) by mouth daily. Take with or immediately following a meal.     omeprazole 20 MG capsule  Commonly known as:  PRILOSEC  Take 20 mg by mouth daily.     potassium chloride 10 MEQ tablet  Commonly known as:  K-DUR,KLOR-CON   TAKE 1 TABLET BY MOUTH DAILY. EMERGENCY REFILL FAXED DR       No Known Allergies     Follow-up Information   Follow up with Curtis Kilts, MD. Schedule an appointment as soon as possible for a visit in 1 week.   Specialty:  Family Medicine   Contact information:   [address] [place] [address] [phone]        The results of significant diagnostics from this hospitalization (including imaging, microbiology, ancillary and laboratory) are listed below for reference.    Significant Diagnostic Studies: Dg Chest Port 1 View  09/05/2013   CLINICAL DATA:  Short of breath  EXAM: PORTABLE CHEST - 1 VIEW  COMPARISON:  08/28/2013  FINDINGS: Worsening congestive heart failure with edema and bilateral effusions. Hypoventilation with bibasilar atelectasis.  IMPRESSION: Worsening congestive heart failure.   Electronically Signed   By: Franchot Gallo M.D.   On: 09/05/2013 11:39   Dg Abd Acute W/chest  08/28/2013   CLINICAL DATA:  Mid abdominal pain for 2 weeks.  EXAM: ACUTE ABDOMEN SERIES (ABDOMEN 2 VIEW & CHEST 1 VIEW)  COMPARISON:  Chest x-ray dated 05/26/2013 and scout image for CT scan dated 03/13/2013  FINDINGS: Heart size and pulmonary vascularity are normal. There is increased accentuation of the interstitial markings bilaterally with slight atelectasis and tiny effusions at the lung bases.  There is no free air or free fluid in the abdomen. The bowel gas pattern is normal. No acute osseous abnormality of the abdomen. Extensive arterial vascular calcifications are present in the abdomen.  IMPRESSION: *No acute abdominal abnormality. *Increased accentuation of interstitial markings in the lungs with tiny bilateral pleural effusions and slight bibasilar atelectasis. This could represent progressive interstitial pneumonitis. I doubt that it represents pulmonary edema.   Electronically Signed   By: Rozetta Nunnery M.D.   On: 08/28/2013 16:46     Microbiology: Recent Results (from the past 240 hour(s))  MRSA PCR SCREENING     Status: None   Collection Time    09/05/13  2:10 PM      Result Value Ref Range Status   MRSA by PCR NEGATIVE  NEGATIVE Final   Comment:            The GeneXpert MRSA Assay (FDA     approved for NASAL specimens     only), is one component of a     comprehensive MRSA colonization     surveillance program. It is not     intended to diagnose MRSA     infection nor to guide or     monitor treatment for     MRSA infections.  Labs: Basic Metabolic Panel:  Recent Labs Lab 09/05/13 1118 09/06/13 0454 09/07/13 0614 09/08/13 0555  NA 136* 139 138 142  K 4.7 4.3 3.9 3.9  CL 98 98 95* 97  CO2 21 24 27 31   GLUCOSE 141* 143* 116* 117*  BUN 19 19 19 18   CREATININE 1.24 1.24 1.12 1.14  CALCIUM 9.3 9.3 9.8 9.2   Liver Function Tests:  Recent Labs Lab 09/06/13 0454  AST 41*  ALT 34  ALKPHOS 89  BILITOT 0.7  PROT 7.4  ALBUMIN 3.4*   No results found for this basename: LIPASE, AMYLASE,  in the last 168 hours No results found for this basename: AMMONIA,  in the last 168 hours CBC:  Recent Labs Lab 09/05/13 1118 09/08/13 0555  WBC 8.4 7.8  NEUTROABS 7.0  --   HGB 14.1 15.6  HCT 42.4 47.3  MCV 98.1 98.5  PLT 285 257   Cardiac Enzymes:  Recent Labs Lab 09/05/13 1118  TROPONINI <0.30   BNP: BNP (last 3 results)  Recent Labs  05/15/13 1415 05/15/13 2042 09/05/13 1118  PROBNP 1558.0* 1687.0* 16211.0*   CBG: No results found for this basename: GLUCAP,  in the last 168 hours     Signed:  Ela Moffat  [facility] 09/09/2013, 10:07 AM

## 2013-09-09 NOTE — Clinical Social Work Note (Signed)
Pt d/c today to [facility]. Pt's daughter and facility aware and agreeable. Notified pt's nephew Jonni Sanger by voicemail who planned to complete paperwork today.  Benay Pike, [place]

## 2013-09-09 NOTE — Clinical Social Work Placement (Signed)
Clinical Social Work Department CLINICAL SOCIAL WORK PLACEMENT NOTE 09/09/2013  Patient:  Curtis Lang, Curtis Lang  Account Number:  [id number] Admit date:  09/05/2013  Clinical Social Worker:  Benay Pike, LCSW  Date/time:  09/08/2013 11:13 AM  Clinical Social Work is seeking post-discharge placement for this patient at the following level of care:   SKILLED NURSING   (*CSW will update this form in Epic as items are completed)   09/08/2013  Patient/family provided with [place] Department of Clinical Social Work's list of facilities offering this level of care within the geographic area requested by the patient (or if unable, by the patient's family).  09/08/2013  Patient/family informed of their freedom to choose among providers that offer the needed level of care, that participate in Medicare, Medicaid or managed care program needed by the patient, have an available bed and are willing to accept the patient.  09/08/2013  Patient/family informed of [facility]' ownership interest in [facility], as well as of the fact that they are under no obligation to receive care at this facility.  PASARR submitted to EDS on 09/08/2013 PASARR number received from EDS on 09/08/2013  FL2 transmitted to all facilities in geographic area requested by pt/family on  09/08/2013 FL2 transmitted to all facilities within larger geographic area on   Patient informed that his/her managed care company has contracts with or will negotiate with  certain facilities, including the following:     Patient/family informed of bed offers received:  09/08/2013 Patient chooses bed at [facility] Physician recommends and patient chooses bed at  [facility]  Patient to be transferred to [facility] on  09/09/2013 Patient to be transferred to facility by RN  The following physician request were entered in Epic:   Additional Comments:  Benay Pike, [place]

## 2013-09-13 ENCOUNTER — Non-Acute Institutional Stay (SKILLED_NURSING_FACILITY): Payer: Medicare Other | Admitting: Internal Medicine

## 2013-09-13 DIAGNOSIS — I10 Essential (primary) hypertension: Secondary | ICD-10-CM

## 2013-09-13 DIAGNOSIS — C8330 Diffuse large B-cell lymphoma, unspecified site: Secondary | ICD-10-CM

## 2013-09-13 DIAGNOSIS — I509 Heart failure, unspecified: Secondary | ICD-10-CM

## 2013-09-13 DIAGNOSIS — I5023 Acute on chronic systolic (congestive) heart failure: Secondary | ICD-10-CM

## 2013-09-13 DIAGNOSIS — I4891 Unspecified atrial fibrillation: Secondary | ICD-10-CM

## 2013-09-13 DIAGNOSIS — F0390 Unspecified dementia without behavioral disturbance: Secondary | ICD-10-CM

## 2013-09-13 DIAGNOSIS — C8589 Other specified types of non-Hodgkin lymphoma, extranodal and solid organ sites: Secondary | ICD-10-CM

## 2013-09-13 NOTE — Progress Notes (Signed)
Patient ID: Curtis Lang, male   DOB: [birth year], 80 y.o.   MRN: [id number]   This is an acute visit.  Level of care skilled.  Facility [facility].  Chief complaint acute visit status post hospitalization for CHF as well as atrial fibrillation.  History of present illness.  Patient is an 80 year old male with a history of chronic systolic CHF as well as cardiomyopathy with an ejection fraction of 25%.  Also has a history of atrial fibrillation and recently was started on Eliquis  .  Patient had been complaining of progressive shortness of breath on exertion for about a week along with weight swelling.  He was diagnosed with acute on chronic systolic CHF and was aggressively diureses his Lasix was changed to by mouth form before discharge.  He continues on a beta blocker as well as ACE inhibitor there was some question about his compliance with his home medications with his history of dementia.  He also has a history of atrial fibrillation apparently rate improved during his hospitalization he was initially on diltiazem and weaned off this Toprol was restarted again he is on anticoagulation.  Patient does appear to have moderate dementia he is on Aricept in the hospital apparently he was oriented to self only and at times to place thought not capable to make decisions.  Also has a history of diffuse large B-cell lymphoma according to patient and apparently a friend this is in remission.  Currently he has no complaints his vital signs are stable some question whether he would be able to return back to independent living certainly would have concerns at this point.  Previous medical history.  Acute on chronic systolic CHF.  Atrial fibrillation.  Diffuse large B-cell lymphoma.  Hypertension.  COPD.  Dementia.  Malnutrition of moderate degree.  Surgical history.  Patient has had previous fusion of his cervical spine-inguinal hernia repair-open reduction internal fixation of  femur-and previous Mohs surgery apparently secondary to squamous cell carcinoma.  Hospital studies.  Cardiac echo did show left ventricular ejection fraction of 25%.  09/05/2013.  Chest x-ray showed worsening congestive heart failure.  08/28/2013.  Abdomen-chest x-ray-showed no acute abdominal abnormality-question progressive interstitial pneumonitis-doubtful for pulmonary edema.  Medications.  Eliquis 5 mg twice a day.  Aricept 5 mg each bedtime.  Lasix 40 mg twice a day.  Lisinopril 20 mg daily.  Toprol-XL 100 mg daily.  Prilosec 20 mg daily.  Potassium 10 mEq daily.  Social history-the patient apparently had lived alone-former smoker --No history of illicit drug or alcohol use.  Family history somewhat sketchy patient poor historian however apparently his mother and father both had CVAs-a brother with a history of coronary artery disease with history of MI.  Review of systems  Gen. no complaints of fever chills..  Skin does not complaining of rash or itching.  Head ears eyes nose mouth and throat-does not complain of any visual changes sore throat or nasal discharge.  Respiratory does not complain of shortness of breath or cough.  Cardiac history CHF in atrial fibrillation but does not complaining of chest pain has minimal lower extremity edema.  GI no complaints of abdominal pain nausea or vomiting diarrhea or constipation.  Neurologic is not complaining of headache numbness or syncopal-type feelings.  Psych appears to have a history of dementia which has led to some noncompliance with his medications apparently.  Physical exam.  Temperature is 97.7 pulse 66 respirations 18 blood pressure 103/62.  In general this is a somewhat frail elderly  male in no distress.  His skin is warm and dry.  Eyes visual acuity appears grossly intact  Pharynx clear mucous membranes moist.  Chest is clear to auscultation with no labored breathing.  Heart is irregular  irregular rate and rhythm without murmur gallop or rub he has minimal lower extremity edema.  Abdomen soft nontender with positive bowel sounds.  Musculoskeletal moves all extremities x4 although he does appear to be somewhat weak I do not note any deformities other than arthritic changes.  Neurologic is grossly intact speech is clear no lateralizing findings.  Psych he is oriented to self not to date or time or place at this time although apparently he is sometimes oriented to place.  Labs.  09/10/2013.  WBC 7.6 hemoglobin 14.7 platelets 254.  Sodium 141 potassium 4.2 BUN 27 creatinine 1.29.  Assessment and plan.  #1-history CHF-I have reviewed his weight since he came to the facility these appear to be stable/he is on Lasix as well as an ACE and beta blocker at this point clinically appears to be stable.  #2 atrial fibrillation he does continue on Toprol for rate control also onEliquest for anticoagulation this appears to be rate controlled heart rate is irregular on exam today.  #3 hypertension this appears to be stable recent blood pressures 103/62-118/64-105/62   [place] one -89/54-although this appears to be somewhat out of what he runs normally.Marland Kitchen we'll have to keep an eye on it however  #4-dementia this appears to be at least moderate-one would wonder if he can really return to an independent setting Will monitor during his stay here he is on Aricept.  #5-COPD this does not appear to be clinically an issue currently.  #6-history of diffuse large B cell lymphoma-I did review his oncology note on 03/20/2013-apparently this is in remission and has been for some time--this is followed by oncology.  [id number] note greater than 35 minutes spent assessing patient-reviewing his medical records-and coordinating and formulating a plan of care for numerous diagnoses-of note greater than 50% of time spent coordinating plan of care

## 2013-09-21 ENCOUNTER — Encounter: Payer: Self-pay | Admitting: Internal Medicine

## 2013-09-22 ENCOUNTER — Non-Acute Institutional Stay (SKILLED_NURSING_FACILITY): Payer: Medicare Other | Admitting: Internal Medicine

## 2013-09-22 DIAGNOSIS — I509 Heart failure, unspecified: Secondary | ICD-10-CM

## 2013-09-22 DIAGNOSIS — C8330 Diffuse large B-cell lymphoma, unspecified site: Secondary | ICD-10-CM

## 2013-09-22 DIAGNOSIS — I5023 Acute on chronic systolic (congestive) heart failure: Secondary | ICD-10-CM

## 2013-09-22 DIAGNOSIS — F0390 Unspecified dementia without behavioral disturbance: Secondary | ICD-10-CM

## 2013-09-22 DIAGNOSIS — C8589 Other specified types of non-Hodgkin lymphoma, extranodal and solid organ sites: Secondary | ICD-10-CM

## 2013-09-22 DIAGNOSIS — I4891 Unspecified atrial fibrillation: Secondary | ICD-10-CM

## 2013-09-25 NOTE — Progress Notes (Addendum)
Patient ID: Curtis Lang, male   DOB: 1930-12-29, 80 y.o.   MRN: [id number]                   PROGRESS NOTE  DATE:  09/22/2013    FACILITY: [place]    LEVEL OF CARE:   SNF   Acute Visit   HISTORY OF PRESENT ILLNESS:  Curtis Lang is a gentleman who came to [place] after a stay at [facility] from 09/05/2013 through 09/09/2013.  He came in with exertional shortness of breath, orthopnea, and edema.  He has a history of chronic systolic congestive heart failure secondary to an ischemic cardiomyopathy with an ejection fraction of 25%.    He has atrial fibrillation, recently started on Apixaban.    He also has diffuse large cell lymphoma.    He was aggressively diuresed, then changed to Lasix p.o.  His last echocardiogram in December 2014 showed an ejection fraction of 25%.  He was discharged with a negative fluid balance of 4.8 L, a weight down of 8 kg.    PAST MEDICAL HISTORY/PROBLEM LIST:    Atrial fibrillation with rapid ventricular response.  On Apixaban and metoprolol.    Ischemic cardiomyopathy.  On metoprolol, lisinopril, Lasix 40 twice a day, potassium 10 mEq per day.  Last lab work on 09/15/2013 showed a BUN of 22, creatinine of 0.99, and a potassium of 3.8.  This will need to be rechecked.    Dementia.  On Aricept 5 mg at bedtime.  He  apparently has been aggressive with some of the staff, other residents here including a roommate.  He was moved to a private room.  He was  apparently living alone.  There is a daughter in [place] who is going to move and take care of him.  He certainly could not manage alone.    COPD.    CURRENT MEDICATIONS:  Medication list is reviewed.     REVIEW OF SYSTEMS:   CHEST/RESPIRATORY:  He is not complaining of shortness of breath.   CARDIAC:  He does not complain of chest pain.    PHYSICAL EXAMINATION:   VITAL SIGNS:   O2 SATURATIONS:  98% on room air.   PULSE:  Irregular at 68.   RESPIRATIONS:  18 and unlabored.     CHEST/RESPIRATORY:  Clear air entry bilaterally.   CARDIOVASCULAR:  CARDIAC:  Heart sounds are regular.  There are no murmurs.  No S3.  No increase in jugular venous pressure.  No peripheral edema and no signs of congestive heart failure.   PSYCHIATRIC:   MENTAL STATUS:   He was able to state his date of birth.   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    ASSESSMENT/PLAN:  Ischemic cardiomyopathy.  This seems very stable at the bedside.    Atrial fibrillation.  His rate is controlled.  He is on Apixaban.  He will need follow-up electrolytes.    Dementia.  I suspect this would turn out to be moderate.  I will increase his Aricept to 10 mg a day.   He certainly could not live alone.    Bcell lymphoma: Left axilla mass post CHOP and radiation. "watchfull waiting" from oncology

## 2013-10-02 ENCOUNTER — Ambulatory Visit: Payer: Medicare Other | Admitting: Gastroenterology

## 2013-10-02 ENCOUNTER — Encounter: Payer: Self-pay | Admitting: Gastroenterology

## 2013-10-02 ENCOUNTER — Telehealth: Payer: Self-pay | Admitting: Gastroenterology

## 2013-10-02 NOTE — Telephone Encounter (Signed)
Pt was a no show

## 2013-10-02 NOTE — Telephone Encounter (Signed)
Mailed letter °

## 2013-10-03 ENCOUNTER — Non-Acute Institutional Stay (SKILLED_NURSING_FACILITY): Payer: Medicare Other | Admitting: Internal Medicine

## 2013-10-03 DIAGNOSIS — I5022 Chronic systolic (congestive) heart failure: Secondary | ICD-10-CM

## 2013-10-03 DIAGNOSIS — I4891 Unspecified atrial fibrillation: Secondary | ICD-10-CM

## 2013-10-03 DIAGNOSIS — J449 Chronic obstructive pulmonary disease, unspecified: Secondary | ICD-10-CM

## 2013-10-03 DIAGNOSIS — I10 Essential (primary) hypertension: Secondary | ICD-10-CM

## 2013-10-03 DIAGNOSIS — J4489 Other specified chronic obstructive pulmonary disease: Secondary | ICD-10-CM

## 2013-10-03 DIAGNOSIS — F0390 Unspecified dementia without behavioral disturbance: Secondary | ICD-10-CM

## 2013-10-03 DIAGNOSIS — I509 Heart failure, unspecified: Secondary | ICD-10-CM

## 2013-10-03 NOTE — Progress Notes (Signed)
Patient ID: Curtis Lang, male   DOB: [birth year], 80 y.o.   MRN: [id number]   This is a discharge note  Level of care skilled.  Facility [facility].   Chief complaint -discharge note   History of present illness.  Patient is an 80 year old male with a history of chronic systolic CHF as well as cardiomyopathy with an ejection fraction of 25%.  Also has a history of atrial fibrillation and recently was started on Eliquis  .  Patient had been complaining of progressive shortness of breath on exertion for about a week along with weight swelling.  He was diagnosed with acute on chronic systolic CHF and was aggressively diureses his Lasix was changed to by mouth form before discharge.  He continues on a beta blocker as well as ACE inhibitor there was some question about his compliance with his home medications with his history of dementia.  He also has a history of atrial fibrillation apparently rate improved during his hospitalization he was initially on diltiazem and weaned off this Toprol was restarted again he is on anticoagulation.  Patient does appear to have moderate dementia he is on Aricept in the hospital apparently he was oriented to self only and at times to place thought not capable to make decisions.  Also has a history of diffuse large B-cell lymphoma according to patient and apparently  this is in remission.  Currently he has no complaints his vital signs are stable --apparently his stay here as been quite unremarkable-his weight has been relatively stable past 10 days Does not complaining of any shortness of breath and is looking forward to going home-his daughter is moving here from the [place] to be with him which I think is a good idea patient would not really be able to be independent with his level of dementia He will receive home health support as well as PT OT and nursing support-he still has some weakness with ambulation and would benefit from therapy.  He also has followup  with gastroenterology scheduled although somewhat unclear what he is being followed for apparently he does have a history of diverticulosis and rectal bleeding several years ago-I note an abdominal x-ray done back in September did not show any acute abnormality--his GI issues appear to have been stable during his stay here with no complaints of diarrhea abdominal pain or rectal bleeding  t.  Previous medical history.  Acute on chronic systolic CHF.  Atrial fibrillation.  Diffuse large B-cell lymphoma.  Hypertension.  COPD.  Dementia.  Malnutrition of moderate degree.   Surgical history.  Patient has had previous fusion of his cervical spine-inguinal hernia repair-open reduction internal fixation of femur-and previous Mohs surgery apparently secondary to squamous cell carcinoma .  Hospital studies.  Cardiac echo did show left ventricular ejection fraction of 25%.  09/05/2013.  Chest x-ray showed worsening congestive heart failure.  08/28/2013.  Abdomen-chest x-ray-showed no acute abdominal abnormality-question progressive interstitial pneumonitis-doubtful for pulmonary edema .  Medications.  Eliquis 5 mg twice a day.  Aricept 10 mg each bedtime.  Lasix 40 mg twice a day.  Lisinopril 20 mg daily.  Toprol-XL 100 mg daily.  Prilosec 20 mg daily.  Potassium 10 mEq daily .  Social history-the patient apparently had lived alone-former smoker  --No history of illicit drug or alcohol use. --Again his daughter will be living with him  Family history somewhat sketchy patient poor historian however apparently his mother and father both had CVAs-a brother with a history of coronary  artery disease with history of MI.   Review of systems  Gen. no complaints of fever chills..  Skin does not complaining of rash or itching.  Head ears eyes nose mouth and throat-does not complain of any visual changes sore throat or nasal discharge.  Respiratory does not complain of shortness of breath or cough.   Cardiac history CHF in atrial fibrillation but does not complaining of chest pain has minimal lower extremity edema.  GI no complaints of abdominal pain nausea or vomiting diarrhea or constipation.  Neurologic is not complaining of headache numbness or syncopal-type feelings.  Psych appears to have a history of dementia which has led to some noncompliance with his medications apparently   whenhe was home   .  Physical exam.  Denture 98.2 pulse 84 respirations 20 blood pressure 118/78 weight 147.2  In general this is a somewhat frail elderly male in no distress.  His skin is warm and dry.  Eyes visual acuity appears grossly intact  Pharynx clear mucous membranes moist.  Chest is clear to auscultation with no labored breathing.  Heart is irregular irregular rate and rhythm without murmur gallop or rub he has minimal lower extremity edema. --Heart sounds are distant--he does not really have significant lower extremity edema Abdomen soft nontender with positive bowel sounds.  Musculoskeletal moves all extremities x4 although he does appear to be somewhat weak I do not note any deformities other than arthritic changes. --He is able to ambulate without assistance however does have somewhat of an unsteady gait Neurologic is grossly intact speech is clear no lateralizing findings.  Psych he is oriented to self not to date or time or place  although apparently he is sometimes oriented to place--continues to be pleasant and appropriate and quite amiable.    Labs  .  09/10/2013.  WBC 7.6 hemoglobin 14.7 platelets 254.  Sodium 141 potassium 4.2 BUN 27 creatinine 1.29  .  Assessment and plan.  #1-history CHF--as appears to be stable on Lasix with potassium supplementation weight has been  relatively stable for the past 10 days he did initially have some weight gain although I suspect it may be because of his better by mouth intake being in a managed  setting clinically this appears stable--we'll  update metabolic panel before discharge .  #2 atrial fibrillation he does continue on Toprol for rate control also onEliquest for anticoagulation this appears to be rate controlled heart rate is irregular on exam today.  #3 hypertension this appears to be stable recent blood pressures--118/78-128/72-132/82   #4-dementia this appears to be at least moderate-his Aricept dose has been increased during his stay here-he is also going to be with his daughter which is reassuring   #5-COPD this does not appear to be clinically an issue currently.  #6-history of diffuse large B cell lymphoma-I did review his oncology note on 03/20/2013-apparently this is in remission and has been for some time--this is followed by oncology.   #7-anemia-this appears stable per recent lab with a hemoglobin of 14.7.  #8-hypertension this appears to have been stable on Toprol as well as lisinopril    [id number].  Of note greater than 30 minutes spent on this discharge summary-greater than 50% of time spent coordinating plan of care.  Addendum-I have received results of the updated metabolic panel it is most significant for a creatinine of 1.4 per review it appears his creatinine runs from 0.9-1.29 recently-we'll decrease his Lasix temporarily to half a dose will only do this for  a day however-will. bring him back to his 40 mg twice a day on Sunday- --and have home health draw a lab on Monday, April 27 for followup by primary care provider

## 2013-10-04 ENCOUNTER — Encounter: Payer: Self-pay | Admitting: Internal Medicine

## 2013-10-28 ENCOUNTER — Other Ambulatory Visit: Payer: Self-pay | Admitting: Cardiology

## 2013-10-28 ENCOUNTER — Other Ambulatory Visit: Payer: Self-pay

## 2013-10-28 MED ORDER — METOPROLOL SUCCINATE ER 100 MG PO TB24: 100.0000 mg | ORAL_TABLET | Freq: Every day | ORAL | Status: DC

## 2013-10-30 ENCOUNTER — Telehealth: Payer: Self-pay | Admitting: Cardiology

## 2013-10-30 NOTE — Telephone Encounter (Signed)
Received fax refill request  Rx # [id number]  Medication:  Donepezil 10 mg  Qty 30 Sig:  Take one tablet by mouth every evening Physician:  Harl Bowie Received fax refill request  Rx # [id number] Medication:  Diphenhydram 25 mg  Qty 30 Sig:  Take one capsule by mouth at bedtime as needed Physician:  Harl Bowie

## 2013-10-30 NOTE — Telephone Encounter (Signed)
Medication refused pcp needs to refill

## 2013-11-12 ENCOUNTER — Ambulatory Visit (INDEPENDENT_AMBULATORY_CARE_PROVIDER_SITE_OTHER): Payer: Medicare Other | Admitting: Gastroenterology

## 2013-11-12 ENCOUNTER — Encounter (INDEPENDENT_AMBULATORY_CARE_PROVIDER_SITE_OTHER): Payer: Self-pay

## 2013-11-12 ENCOUNTER — Encounter: Payer: Self-pay | Admitting: Gastroenterology

## 2013-11-12 VITALS — BP 120/76 | HR 72 | Temp 98.0°F | Ht 67.0 in | Wt 150.4 lb

## 2013-11-12 DIAGNOSIS — R634 Abnormal weight loss: Secondary | ICD-10-CM

## 2013-11-12 DIAGNOSIS — R6881 Early satiety: Secondary | ICD-10-CM

## 2013-11-12 DIAGNOSIS — I7090 Unspecified atherosclerosis: Secondary | ICD-10-CM

## 2013-11-12 DIAGNOSIS — R1314 Dysphagia, pharyngoesophageal phase: Secondary | ICD-10-CM

## 2013-11-12 DIAGNOSIS — R1319 Other dysphagia: Secondary | ICD-10-CM | POA: Insufficient documentation

## 2013-11-12 DIAGNOSIS — I2510 Atherosclerotic heart disease of native coronary artery without angina pectoris: Secondary | ICD-10-CM

## 2013-11-12 DIAGNOSIS — R1310 Dysphagia, unspecified: Secondary | ICD-10-CM | POA: Insufficient documentation

## 2013-11-12 DIAGNOSIS — F0390 Unspecified dementia without behavioral disturbance: Secondary | ICD-10-CM

## 2013-11-12 NOTE — Progress Notes (Signed)
Primary Care Physician:  Purvis Kilts, MD  Primary Gastroenterologist:  Garfield Cornea, MD   Chief Complaint  Patient presents with  . Dysphagia    HPI:  Curtis Lang is a 80 y.o. male here for further evaluation of dysphagia at the request of Dr. Ethlyn Gallery.  Butch Penny, daughter, present today. Patient suspected to have dementia, recently referred to neurologist and started on Aricept. He denies any problems with swallowing. Apparently he was having issues with "strangling" on water while in nursing home and before daughter moved back to take care of patient. She notes he eats very slowly. Very set in his diet which never changes. Eats out 3 times daily. Patient complains of early satiety. There has been no vomiting. Report of epigastric pain and 30 pound weight loss over the past six months. Has intermittent diarrhea for which he takes imodium as needed. No melena, brbpr. TCS 2012 as outlined below.  When seen by PCP recently he reported upper abdominal pain and pain with swallowing.      In the hospital, the hospitalist noted that the patient does not have the capacity to make his decisions.   Weighed 168 lb in December 2014. 141 pounds in 08/2013.   Current Outpatient Prescriptions  Medication Sig Dispense Refill  . apixaban (ELIQUIS) 5 MG TABS tablet Take 1 tablet (5 mg total) by mouth 2 (two) times daily.  60 tablet  6  . diphenhydrAMINE (BENADRYL) 25 mg capsule Take 25 mg by mouth every 6 (six) hours as needed.      . donepezil (ARICEPT) 5 MG tablet Take 10 mg by mouth at bedtime.       . furosemide (LASIX) 40 MG tablet Take 40 mg by mouth 2 (two) times daily. May take extra 40mg  for increase swelling, weight gain      . lisinopril (PRINIVIL,ZESTRIL) 20 MG tablet TAKE 1 TABLET BY MOUTH EVERY DAY  90 tablet  6  . metoprolol succinate (TOPROL-XL) 100 MG 24 hr tablet Take 1 tablet (100 mg total) by mouth daily. Take with or immediately following a meal.  90 tablet  3  . omeprazole  (PRILOSEC) 20 MG capsule Take 20 mg by mouth daily.       No current facility-administered medications for this visit.    Allergies as of 11/12/2013  . (No Known Allergies)    Past Medical History  Diagnosis Date  . Arteriosclerotic cardiovascular disease (ASCVD)     BMS to RCA 1997; cutting ballon for RCA restenosis in 2001; cath 12/10 - 100% RCA L->R collaterals; nl EF; 40% LAD and OM1  . Hypertension   . Hyperlipidemia   . Cerebrovascular disease     bilateral bruits; duplex in 2009 - mild plaque w/o stenosis  . Peripheral vascular disease     ABI on 0.87 on left  . Tobacco abuse, in remission     60 pack years; discontinued in 2003  . COPD (chronic obstructive pulmonary disease)   . Fasting hyperglycemia   . DDD (degenerative disc disease), cervical   . Muscle discomfort     No improvement after statins temporary discontinued  . Hypercholesteremia   . Lymphoma     chemotherapy and radiation therapy in 2006; recurrence in 2007  . Squamous cell carcinoma     s/p Mohs surgery  . Cancer     b cell lymphoma  . Diffuse large B cell lymphoma 03/18/2010    Past Surgical History  Procedure Laterality Date  . Anterior  fusion cervical spine  2003    posterior?  . Inguinal hernia repair  1996    Right  . Orif femur fracture  1996  . Mohs surgery    . Colonoscopy  05/16/2011    Procedure: COLONOSCOPY;  Surgeon: Jamesetta So; diverticulosis, sigmoid polypectomy  . Port-a-cath removal Right 03/10/2013    Procedure: REMOVAL PORT-A-CATH;  Surgeon: Scherry Ran, MD;  Location: [facility];  Service: General;  Laterality: Right;  . Pyloric stenosis surgery      infant      Family History  Problem Relation Age of Onset  . Stroke Father     deceased - 66  . Stroke Mother     deceased - 3   . Heart attack Brother 21  . Heart attack Sister   . Colon cancer Neg Hx     History   Social History  . Marital Status: Divorced    Spouse Name: N/A    Number of Children: N/A   . Years of Education: N/A   Occupational History  . Not on file.   Social History Main Topics  . Smoking status: Former Smoker -- 1.00 packs/day for 30 years  . Smokeless tobacco: Not on file  . Alcohol Use: No  . Drug Use: No  . Sexual Activity: Not on file   Other Topics Concern  . Not on file   Social History Narrative   Divorced; retired; does not get regular exercise.       ROS:  General: Negative for anorexia,  fever, chills, fatigue, weakness. +30 pound weight loss per daughter in past six months. Eyes: Negative for vision changes.  ENT: Negative for hoarseness, nasal congestion. See hpi. CV: Negative for chest pain, angina, palpitations, dyspnea on exertion, peripheral edema.  Respiratory: Negative for dyspnea at rest, dyspnea on exertion, cough, sputum, wheezing.  GI: See history of present illness. GU:  Negative for dysuria, hematuria, urinary incontinence, urinary frequency, nocturnal urination.  MS: Negative for joint pain, low back pain.  Derm: Negative for rash or itching.  Neuro: Negative for weakness, abnormal sensation, seizure, frequent headaches, memory loss, confusion.  Psych: Negative for anxiety, depression, suicidal ideation, hallucinations.  Endo:see hpi  Heme: Negative for bruising or bleeding. Allergy: Negative for rash or hives.    Physical Examination:  BP 120/76  Pulse 72  Temp(Src) 98 F (36.7 C) (Oral)  Ht 5\' 7"  (1.702 m)  Wt 150 lb 6.4 oz (68.221 kg)  BMI 23.55 kg/m2   General: Well-nourished, well-developed in no acute distress. Accompanied by daughter, Butch Penny. Patient pleasant but resistant. Head: Normocephalic, atraumatic.   Eyes: Conjunctiva pink, no icterus. Mouth: Oropharyngeal mucosa moist and pink , no lesions erythema or exudate. Neck: Supple without thyromegaly, masses, or lymphadenopathy.  Lungs: Clear to auscultation bilaterally.  Heart: Regular rate and rhythm, no murmurs rubs or gallops.  Abdomen: Bowel sounds are  normal, nontender, nondistended, no hepatosplenomegaly or masses, no abdominal bruits or    hernia , no rebound or guarding.   Rectal: not performed Extremities: No lower extremity edema. No clubbing or deformities.  Neuro: Alert and oriented x 4 , grossly normal neurologically.  Skin: Warm and dry, no rash or jaundice.   Psych: Alert and cooperative, normal mood and affect.  Labs: Lab Results  Component Value Date   WBC 8.4 09/09/2013   HGB 15.6 09/09/2013   HCT 48.5 09/09/2013   MCV 99.6 09/09/2013   PLT 279 09/09/2013   Lab Results  Component  Value Date   CREATININE 1.23 09/09/2013   BUN 25* 09/09/2013   NA 138 09/09/2013   K 4.3 09/09/2013   CL 95* 09/09/2013   CO2 30 09/09/2013   Lab Results  Component Value Date   ALT 34 09/06/2013   AST 41* 09/06/2013   ALKPHOS 89 09/06/2013   BILITOT 0.7 09/06/2013     Imaging Studies: No results found.

## 2013-11-12 NOTE — Assessment & Plan Note (Signed)
80 year old gentleman with dementia who presents with his daughter today for further evaluation of reported esophageal dysphagia/odynophagia, early satiety, abnormal weight loss. History is somewhat difficult as patient has some dementia and denies dysphagia. His history changes throughout the examination. Daughter who is present with him has been living with the patient for about 6 weeks and reports that he has had significant weight loss as outline above and intermittent complaints of abdominal pain, self-limiting diarrhea. Patient is on Eliquis for A. fib, started back in December. Daughter wants the patient had an upper endoscopy which is been recommended by the PCP as well. At this time patient is not deemed appropriate for making his own decisions based on his dementia. This is well documented during his recent hospitalization this spring. We will need to have the daughter obtain legal healthcare power of attorney, contact members with adult protective services provided to the daughter today. We will be glad to schedule the patient once this is been taking care of so that appropriate consent can be obtained.  We will need to hold Eliquis for 2 days prior to procedure once we schedule him.

## 2013-11-12 NOTE — Patient Instructions (Signed)
1. We need to get you scheduled for an upper endoscopy to evaluate your swallowing issues, weight loss. We will call and schedule once consent issues are addressed as discussed today.

## 2013-11-13 ENCOUNTER — Encounter: Payer: Self-pay | Admitting: *Deleted

## 2013-11-13 NOTE — Progress Notes (Signed)
cc'd to pcp 

## 2013-11-18 ENCOUNTER — Ambulatory Visit (INDEPENDENT_AMBULATORY_CARE_PROVIDER_SITE_OTHER): Payer: Medicare Other | Admitting: Neurology

## 2013-11-18 ENCOUNTER — Encounter: Payer: Self-pay | Admitting: Neurology

## 2013-11-18 VITALS — BP 132/70 | HR 70 | Temp 97.5°F | Resp 16 | Ht 68.0 in | Wt 147.5 lb

## 2013-11-18 DIAGNOSIS — I7090 Unspecified atherosclerosis: Secondary | ICD-10-CM

## 2013-11-18 DIAGNOSIS — G309 Alzheimer's disease, unspecified: Principal | ICD-10-CM

## 2013-11-18 DIAGNOSIS — I2510 Atherosclerotic heart disease of native coronary artery without angina pectoris: Secondary | ICD-10-CM

## 2013-11-18 DIAGNOSIS — F0150 Vascular dementia without behavioral disturbance: Secondary | ICD-10-CM

## 2013-11-18 DIAGNOSIS — F0280 Dementia in other diseases classified elsewhere without behavioral disturbance: Secondary | ICD-10-CM

## 2013-11-18 MED ORDER — MEMANTINE HCL 28 X 5 MG & 21 X 10 MG PO TABS: ORAL_TABLET | ORAL | Status: DC

## 2013-11-18 NOTE — Progress Notes (Signed)
NEUROLOGY CONSULTATION NOTE  Curtis Lang MRN: [id number] DOB: 08/08/1930  Referring provider: Dr. Hilma Favors Primary care provider: Dr. Hilma Favors  Reason for consult:  Dementia  HISTORY OF PRESENT ILLNESS: Curtis Lang is an 80 year old right-handed man with history of CHF, atrial fibrillation (on Eliquis), arteriosclerotic cardiovascular disease, hypertension, hyperlipidemia, cerebrovascular disease, peripheral vascular disease, COPD, B cell lymphoma status post chemo and radiation in 2006 with recurrence in 2007, squamous cell carcinoma status post Mohs surgery who presents for dementia.  Records and images personally reviewed.  History is not entirely clear as his daughter just recently moved back to live with him after living away from home for the past 35 years. It appears that he has had a cognitive decline over the past one to one and a half years. It is not clear if initial symptoms were problems with memory. He would have some periods of confusion. Up until April, when his daughter moved back to live with him, he was living on his own. He began forgetting to take his medications or was confused on the proper way to take his medications. He began forgetting to pay bills and forgot to file his taxes. He would sometimes forget names of acquaintances. He had some problems with remote memory as well. When talking about her past event, he will not remember what is daughter was talking about. He no longer drives but previously he did get lost while driving. He was not involved in any accidents or near accidents. He says he sleeps pretty well but his daughter can sometimes hear him walking around the house at night. He is able to perform all his activities of daily living such as bathing and dressing. He never used a cook for himself and often eats out. His appetite is pretty good overall. He has not exhibited any change in behavior or personality. However, he does seem a little more irritable and  cusses more often. He did have an episode of hallucinations when he thought he saw a young man sitting in his house. Prior to moving back home, his daughter has noted that he stop sending Christmas and birthday cards approximately 3-4 years ago. Also, when talking to him on the phone, he appeared to lose his train of thought and forget what he was going to say midsentence. Do to Lasix, he does occasionally have urinary incontinence. He does have history of soft stool and has had bowel incontinence as well. When she moved back home, his house did not appear to be filthy, messy or in disarray. He does not have any episodes of sundowning or combativeness. He was recently admitted to the hospital for CHF exacerbation.  His father had history of strokes as well as confusion. His brother is may have had history of cognitive problems as well.  He currently takes Aricept 10mg  at bedtime.  Currently, he takes Aricept 5mg  daily.  04/22/13 CT HEAD WO:  diffuse atrophy and chronic small vessel ischemic changes. 05/15/13 TSH 3.220 01/12/11 B12 231 PAST MEDICAL HISTORY: Past Medical History  Diagnosis Date  . Arteriosclerotic cardiovascular disease (ASCVD)     BMS to RCA 1997; cutting ballon for RCA restenosis in 2001; cath 12/10 - 100% RCA L->R collaterals; nl EF; 40% LAD and OM1  . Hypertension   . Hyperlipidemia   . Cerebrovascular disease     bilateral bruits; duplex in 2009 - mild plaque w/o stenosis  . Peripheral vascular disease     ABI on 0.87 on left  .  Tobacco abuse, in remission     60 pack years; discontinued in 2003  . COPD (chronic obstructive pulmonary disease)   . Fasting hyperglycemia   . DDD (degenerative disc disease), cervical   . Muscle discomfort     No improvement after statins temporary discontinued  . Hypercholesteremia   . Lymphoma     chemotherapy and radiation therapy in 2006; recurrence in 2007  . Squamous cell carcinoma     s/p Mohs surgery  . Cancer     b cell lymphoma    . 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 Dementia   . CHF (congestive heart failure)   . Atrial fibrillation     PAST SURGICAL HISTORY: Past Surgical History  Procedure Laterality Date  . Anterior fusion cervical spine  2003    posterior?  . Inguinal hernia repair  1996    Right  . Orif femur fracture  1996  . Mohs surgery    . Colonoscopy  05/16/2011    Procedure: COLONOSCOPY;  Surgeon: Jamesetta So; diverticulosis, sigmoid polypectomy  . Port-a-cath removal Right 03/10/2013    Procedure: REMOVAL PORT-A-CATH;  Surgeon: Scherry Ran, MD;  Location: [facility];  Service: General;  Laterality: Right;  . Pyloric stenosis surgery      infant    MEDICATIONS: Current Outpatient Prescriptions on File Prior to Visit  Medication Sig Dispense Refill  . apixaban (ELIQUIS) 5 MG TABS tablet Take 1 tablet (5 mg total) by mouth 2 (two) times daily.  60 tablet  6  . diphenhydrAMINE (BENADRYL) 25 mg capsule Take 25 mg by mouth every 6 (six) hours as needed.      . donepezil (ARICEPT) 5 MG tablet Take 10 mg by mouth at bedtime.       . furosemide (LASIX) 40 MG tablet Take 40 mg by mouth 2 (two) times daily. May take extra 40mg  for increase swelling, weight gain      . lisinopril (PRINIVIL,ZESTRIL) 20 MG tablet TAKE 1 TABLET BY MOUTH EVERY DAY  90 tablet  6  . metoprolol succinate (TOPROL-XL) 100 MG 24 hr tablet Take 1 tablet (100 mg total) by mouth daily. Take with or immediately following a meal.  90 tablet  3  . omeprazole (PRILOSEC) 20 MG capsule Take 20 mg by mouth daily.       No current facility-administered medications on file prior to visit.    ALLERGIES: Allergies  Allergen Reactions  . Nexium [Esomeprazole Magnesium]     FAMILY HISTORY: Family History  Problem Relation Age of Onset  . Stroke Father     deceased - 23  . Stroke Mother     deceased - 56   . Heart attack Brother 54  . Heart attack Sister   . Colon cancer Neg Hx     SOCIAL HISTORY: History    Social History  . Marital Status: Divorced    Spouse Name: N/A    Number of Children: N/A  . Years of Education: N/A   Occupational History  . Not on file.   Social History Main Topics  . Smoking status: Former Smoker -- 1.00 packs/day for 30 years  . Smokeless tobacco: Not on file  . Alcohol Use: No  . Drug Use: No  . Sexual Activity: Not on file   Other Topics Concern  . Not on file   Social History Narrative   Divorced; retired; does not get regular exercise.     REVIEW OF SYSTEMS: Constitutional:  No fevers, chills, or sweats, no generalized fatigue, change in appetite Eyes: No visual changes, double vision, eye pain Ear, nose and throat: No hearing loss, ear pain, nasal congestion, sore throat Cardiovascular: No chest pain, palpitations Respiratory:  No shortness of breath at rest or with exertion, wheezes GastrointestinaI: No nausea, vomiting, diarrhea, abdominal pain, fecal incontinence Genitourinary:  No dysuria, urinary retention or frequency Musculoskeletal:  No neck pain, back pain Integumentary: No rash, pruritus, skin lesions Neurological: as above Psychiatric: No depression, insomnia, anxiety Endocrine: No palpitations, fatigue, diaphoresis, mood swings, change in appetite, change in weight, increased thirst Hematologic/Lymphatic:  No anemia, purpura, petechiae. Allergic/Immunologic: no itchy/runny eyes, nasal congestion, recent allergic reactions, rashes  PHYSICAL EXAM: Filed Vitals:   11/18/13 1422  BP: 132/70  Pulse: 70  Temp: 97.5 F (36.4 C)  Resp: 16   General: No acute distress Head:  Normocephalic/atraumatic Neck: supple, no paraspinal tenderness, full range of motion Back: No paraspinal tenderness Heart: regular rate and rhythm Lungs: Clear to auscultation bilaterally. Vascular: No carotid bruits. Neurological Exam: Mental status: alert and oriented to person and place but not time, recent and remote memory impaired (unable to recall 5  of 5 words), fund of knowledge somewhat impaired, attention and concentration impaired,  visuospatial and executive functioning impaired (Trail Making Test, copying cube and drawing clock), attention and abstraction impaired, speech fluent and not dysarthric, language overall intact (one paraphasic error with naming and unable to say a word that begins with letter F).  [place] 6/30. Cranial nerves: CN I: not tested CN II: pupils equal, round and reactive to light, visual fields intact, fundi unremarkable, without vessel changes, exudates, hemorrhages or papilledema. CN III, IV, VI:  full range of motion, no nystagmus, no ptosis CN V: facial sensation intact CN VII: upper and lower face symmetric CN VIII: hearing intact CN IX, X: gag intact, uvula midline CN XI: sternocleidomastoid and trapezius muscles intact CN XII: tongue midline Bulk & Tone: normal, no fasciculations. Motor: 5/5 throughout Sensation: reduced vibration in the toes.  Pinprick intact. Deep Tendon Reflexes: 2+ throughout, right Babinski, left downgoing Finger to nose testing: no dysmetria Heel to shin: no dysmetria Gait: normal station and stride.  Difficulty with tandem. Romberg negative.  IMPRESSION: Likely mixed Alzheimer's and vascular dementia.  He does have right Babinski, which may be due to a remote stroke.  PLAN: 1.  Will add Namenda. 2.  Continue Aricept. 3.  Check fasting lipid panel (LDL goal should be less than 100) and carotid dopplers 4.  Already on anticoagulation. 5.  Play brain teasers 6.  No driving 7.  Check B12 8.  Follow up in 6 months.  Thank you for allowing me to take part in the care of this patient.  Metta Clines, DO  CC:  Sharilyn Sites, MD

## 2013-11-18 NOTE — Patient Instructions (Addendum)
1.  Start Namenda (memantine) 10mg  tablets.  Take 1/2 tablet at bedtime for 7 days, then 1/2 tablet twice daily for 7 days, then 1/2 tablet in morning and 1 tablet at bedtime for 7 days, then 1 tablet twice daily.   Side effects include dizziness, headache, diarrhea or constipation.  Call with any questions or concerns. 2.  Continue Aricept (donepezil) 10mg  at bedtime. 3.  Do brain teasers 4.  We will check fasting lipid panel and carotid doppler [place]  11/25/13 9:45 am  5.  We will check B12 level. 6.  Follow up in  6 months. 7  No driving

## 2013-11-19 ENCOUNTER — Telehealth: Payer: Self-pay | Admitting: *Deleted

## 2013-11-20 NOTE — Telephone Encounter (Signed)
Error

## 2013-11-25 ENCOUNTER — Ambulatory Visit (HOSPITAL_COMMUNITY): Payer: 59

## 2013-11-27 ENCOUNTER — Ambulatory Visit (HOSPITAL_COMMUNITY)
Admission: RE | Admit: 2013-11-27 | Discharge: 2013-11-27 | Disposition: A | Discharge status: Home / Self Care | Payer: Medicare Other | Source: Ambulatory Visit | Attending: Neurology | Admitting: Neurology

## 2013-11-27 ENCOUNTER — Other Ambulatory Visit: Payer: Self-pay | Admitting: Adult Health

## 2013-11-27 DIAGNOSIS — G309 Alzheimer's disease, unspecified: Principal | ICD-10-CM | POA: Insufficient documentation

## 2013-11-27 DIAGNOSIS — I739 Peripheral vascular disease, unspecified: Secondary | ICD-10-CM

## 2013-11-27 DIAGNOSIS — I672 Cerebral atherosclerosis: Secondary | ICD-10-CM | POA: Insufficient documentation

## 2013-11-27 DIAGNOSIS — F0150 Vascular dementia without behavioral disturbance: Secondary | ICD-10-CM

## 2013-11-27 DIAGNOSIS — F0280 Dementia in other diseases classified elsewhere without behavioral disturbance: Secondary | ICD-10-CM | POA: Insufficient documentation

## 2013-11-27 LAB — LIPID PANEL
CHOLESTEROL: 203 mg/dL — AB (ref 0–200)
HDL: 40 mg/dL (ref >39)
LDL CALC: 136 mg/dL — AB (ref 0–99)
TRIGLYCERIDES: 134 mg/dL (ref <150)
Total CHOL/HDL Ratio: 5.1 Ratio
VLDL: 27 mg/dL (ref 0–40)

## 2013-11-27 LAB — VITAMIN B12: Vitamin B-12: 273 pg/mL (ref 211–911)

## 2013-11-27 NOTE — Progress Notes (Signed)
*  PRELIMINARY RESULTS* Vascular Ultrasound Carotid Duplex (Doppler) has been completed.  Preliminary findings: Bilateral:  1-39% ICA stenosis.  Vertebral artery flow is antegrade.      Landry Mellow, RDMS, RVT  11/27/2013, 10:55 AM

## 2013-11-28 ENCOUNTER — Telehealth: Payer: Self-pay | Admitting: Neurology

## 2013-11-28 DIAGNOSIS — R413 Other amnesia: Secondary | ICD-10-CM

## 2013-11-28 NOTE — Telephone Encounter (Signed)
Made aware doppler normal.

## 2013-11-28 NOTE — Telephone Encounter (Signed)
Message copied by Annamaria Helling on Fri Nov 28, 2013  8:53 AM ------      Message from: JAFFE, ADAM R      Created: Fri Nov 28, 2013  5:55 AM       The patient's LDL (bad cholesterol) is 136.  It should be less than 100.  Therefore, I recommend contacting his PCP regarding initiation of a statin (cholesterol-lowering drug).  His vitamin B12 is in the low-normal range.  I would like to check a methylmalonic acid level as well.      ----- Message -----         From: [facility]         Sent: 11/27/2013  11:48 PM           To: Dudley Major, DO                   ------

## 2013-11-28 NOTE — Telephone Encounter (Signed)
Patient's daughter made aware of results. They will follow up with PCP and have Methylmalonic acid drawn next Friday when they are seeing another MD in our building.

## 2013-12-03 ENCOUNTER — Telehealth: Payer: Self-pay | Admitting: Gastroenterology

## 2013-12-03 NOTE — Telephone Encounter (Signed)
What is the status of daughter getting ability to consent for patient. We have not scheduled his EGD/ED because of this.

## 2013-12-05 NOTE — Telephone Encounter (Signed)
Spoke with pts daughter, she is still working on getting POA for the pt. She has been having multiple other problems with family issues and will try to get this done asap. She is aware that his H&P is only good for 30 days and that 30 days will be up next week.

## 2014-01-26 ENCOUNTER — Other Ambulatory Visit: Payer: Self-pay

## 2014-01-26 NOTE — Telephone Encounter (Signed)
Fax received from [facility], note made that patient's refills need to go to PCP and faxed back to [phone]

## 2014-01-27 ENCOUNTER — Other Ambulatory Visit: Payer: Self-pay | Admitting: Cardiology

## 2014-02-05 NOTE — Telephone Encounter (Signed)
Please find out how many children the patient has. If the daughter he is living with is his only child, she would be able to sign consent for him. Is he married, a spouse could sign for him.   This is new information provided by risk management.

## 2014-02-06 NOTE — Telephone Encounter (Signed)
Tried to call no answer

## 2014-02-18 NOTE — Telephone Encounter (Signed)
Is someone still working on this.

## 2014-02-18 NOTE — Telephone Encounter (Signed)
Noted. Offer her OV for patient whenever they are ready.

## 2014-02-18 NOTE — Telephone Encounter (Signed)
Pts daughter is aware. She will call and schedule ov when they are ready.

## 2014-02-18 NOTE — Telephone Encounter (Signed)
I called pts daughter- Butch Penny- she is an only child, ( there is a half sister but she is on her mothers side) she is living her with her father now to take care of him.  She has been here since April. Parents are divorced.She was going through papers at her dads house, trying to get things in order for the lawyer and she found an old medical POA with her name on it. She will bring it by the office so we can make a copy of it.  She said that currently he is not having any problems. He is eating ok and swallowing ok. She said he eats really slow but he is not getting choked. She said he doesn't complain about any pain. He may have an episode of diarrhea every once in awhile. She doesn't feel like he eats enough and he drinks tea but not a lot of water. She feels like maybe the weather is what is causing him to not eat very much and she would like to bring him in sometime in the fall, when its not quite so hot outside.

## 2014-03-16 ENCOUNTER — Other Ambulatory Visit (HOSPITAL_COMMUNITY): Payer: Medicare Other

## 2014-03-16 ENCOUNTER — Encounter (HOSPITAL_COMMUNITY): Payer: Medicare Other | Attending: Hematology

## 2014-03-16 DIAGNOSIS — F0390 Unspecified dementia without behavioral disturbance: Secondary | ICD-10-CM | POA: Insufficient documentation

## 2014-03-16 DIAGNOSIS — Z923 Personal history of irradiation: Secondary | ICD-10-CM | POA: Insufficient documentation

## 2014-03-16 DIAGNOSIS — J449 Chronic obstructive pulmonary disease, unspecified: Secondary | ICD-10-CM | POA: Insufficient documentation

## 2014-03-16 DIAGNOSIS — N400 Enlarged prostate without lower urinary tract symptoms: Secondary | ICD-10-CM | POA: Diagnosis not present

## 2014-03-16 DIAGNOSIS — I4891 Unspecified atrial fibrillation: Secondary | ICD-10-CM | POA: Diagnosis not present

## 2014-03-16 DIAGNOSIS — C8330 Diffuse large B-cell lymphoma, unspecified site: Secondary | ICD-10-CM | POA: Insufficient documentation

## 2014-03-16 DIAGNOSIS — I739 Peripheral vascular disease, unspecified: Secondary | ICD-10-CM | POA: Diagnosis not present

## 2014-03-16 LAB — CBC WITH DIFFERENTIAL/PLATELET
BASOS ABS: 0 10*3/uL (ref 0.0–0.1)
Basophils Relative: 0 % (ref 0–1)
Eosinophils Absolute: 0.3 10*3/uL (ref 0.0–0.7)
Eosinophils Relative: 3 % (ref 0–5)
HCT: 39.4 % (ref 39.0–52.0)
Hemoglobin: 13.6 g/dL (ref 13.0–17.0)
Lymphocytes Relative: 14 % (ref 12–46)
Lymphs Abs: 1.5 10*3/uL (ref 0.7–4.0)
MCH: 32.9 pg (ref 26.0–34.0)
MCHC: 34.5 g/dL (ref 30.0–36.0)
MCV: 95.4 fL (ref 78.0–100.0)
Monocytes Absolute: 0.6 10*3/uL (ref 0.1–1.0)
Monocytes Relative: 6 % (ref 3–12)
NEUTROS ABS: 8.1 10*3/uL — AB (ref 1.7–7.7)
Neutrophils Relative %: 77 % (ref 43–77)
Platelets: 285 10*3/uL (ref 150–400)
RBC: 4.13 MIL/uL — ABNORMAL LOW (ref 4.22–5.81)
RDW: 13.7 % (ref 11.5–15.5)
WBC: 10.5 10*3/uL (ref 4.0–10.5)

## 2014-03-16 LAB — COMPREHENSIVE METABOLIC PANEL
ALT: 7 U/L (ref 0–53)
ANION GAP: 15 (ref 5–15)
AST: 14 U/L (ref 0–37)
Albumin: 3.6 g/dL (ref 3.5–5.2)
Alkaline Phosphatase: 126 U/L — ABNORMAL HIGH (ref 39–117)
BUN: 8 mg/dL (ref 6–23)
CALCIUM: 9.1 mg/dL (ref 8.4–10.5)
CO2: 27 meq/L (ref 19–32)
CREATININE: 0.84 mg/dL (ref 0.50–1.35)
Chloride: 99 mEq/L (ref 96–112)
GFR calc Af Amer: >90 mL/min (ref >90)
GFR, EST NON AFRICAN AMERICAN: 80 mL/min — AB (ref >90)
Glucose, Bld: 188 mg/dL — ABNORMAL HIGH (ref 70–99)
Potassium: 3.9 mEq/L (ref 3.7–5.3)
Sodium: 141 mEq/L (ref 137–147)
Total Bilirubin: 0.6 mg/dL (ref 0.3–1.2)
Total Protein: 8.2 g/dL (ref 6.0–8.3)

## 2014-03-16 LAB — RETICULOCYTES
RBC.: 4.13 MIL/uL — AB (ref 4.22–5.81)
Retic Count, Absolute: 74.3 10*3/uL (ref 19.0–186.0)
Retic Ct Pct: 1.8 % (ref 0.4–3.1)

## 2014-03-16 LAB — LACTATE DEHYDROGENASE: LDH: 220 U/L (ref 94–250)

## 2014-03-16 NOTE — Progress Notes (Signed)
LABS FOR PSA,B2M,CBCD,CMP,LDH,RETIC
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
## 2014-03-17 LAB — PSA: PSA: 2.99 ng/mL (ref <=4.00)

## 2014-03-18 ENCOUNTER — Ambulatory Visit (HOSPITAL_COMMUNITY): Payer: Medicare Other | Admitting: Oncology

## 2014-03-18 ENCOUNTER — Encounter (HOSPITAL_COMMUNITY): Payer: Self-pay | Admitting: Oncology

## 2014-03-18 ENCOUNTER — Encounter (HOSPITAL_BASED_OUTPATIENT_CLINIC_OR_DEPARTMENT_OTHER): Payer: Medicare Other | Admitting: Oncology

## 2014-03-18 ENCOUNTER — Ambulatory Visit: Payer: 59 | Admitting: Cardiology

## 2014-03-18 VITALS — BP 132/62 | HR 59 | Temp 97.9°F | Resp 18 | Wt 154.3 lb

## 2014-03-18 DIAGNOSIS — Z8572 Personal history of non-Hodgkin lymphomas: Secondary | ICD-10-CM

## 2014-03-18 DIAGNOSIS — Z23 Encounter for immunization: Secondary | ICD-10-CM

## 2014-03-18 DIAGNOSIS — C8330 Diffuse large B-cell lymphoma, unspecified site: Secondary | ICD-10-CM

## 2014-03-18 LAB — BETA 2 MICROGLOBULIN, SERUM: BETA 2 MICROGLOBULIN: 3.43 mg/L — AB (ref <=2.51)

## 2014-03-18 MED ORDER — INFLUENZA VAC SPLIT QUAD 0.5 ML IM SUSY
0.5000 mL | PREFILLED_SYRINGE | Freq: Once | INTRAMUSCULAR | Status: AC
  Administered 2014-03-18: 0.5 mL via INTRAMUSCULAR
  Filled 2014-03-18: qty 0.5

## 2014-03-18 NOTE — Patient Instructions (Signed)
[facility] Discharge Instructions  RECOMMENDATIONS MADE BY THE CONSULTANT AND ANY TEST RESULTS WILL BE SENT TO YOUR REFERRING PHYSICIAN.  EXAM FINDINGS BY THE PHYSICIAN TODAY AND SIGNS OR SYMPTOMS TO REPORT TO CLINIC OR PRIMARY PHYSICIAN: Exam and findings as discussed by Robynn Pane, PA-C. Check with your primary care physician about the areas on your skin that you are concerned about. Report night sweats, unexplained weight loss, fevers, etc.  Will give your flu vaccine today.    INSTRUCTIONS/FOLLOW-UP: Follow-up in 1 year with labs and office visit.  Thank you for choosing [facility] to provide your oncology and hematology care.  To afford each patient quality time with our providers, please arrive at least 15 minutes before your scheduled appointment time.  With your help, our goal is to use those 15 minutes to complete the necessary work-up to ensure our physicians have the information they need to help with your evaluation and healthcare recommendations.    Effective January 1st, 2014, we ask that you re-schedule your appointment with our physicians should you arrive 10 or more minutes late for your appointment.  We strive to give you quality time with our providers, and arriving late affects you and other patients whose appointments are after yours.    Again, thank you for choosing [facility].  Our hope is that these requests will decrease the amount of time that you wait before being seen by our physicians.       _____________________________________________________________  Should you have questions after your visit to [facility], please contact our office at [phone] between the hours of 8:30 a.m. and 4:30 p.m.  Voicemails left after 4:30 p.m. will not be returned until the following business day.  For prescription refill requests, have your pharmacy contact our office with your prescription refill request.     _______________________________________________________________  We hope that we have given you very good care.  You may receive a patient satisfaction survey in the mail, please complete it and return it as soon as possible.  We value your feedback!  _______________________________________________________________  Have you asked about our STAR program?  STAR stands for Survivorship Training and Rehabilitation, and this is a nationally recognized cancer care program that focuses on survivorship and rehabilitation.  Cancer and cancer treatments may cause problems, such as, pain, making you feel tired and keeping you from doing the things that you need or want to do. Cancer rehabilitation can help. Our goal is to reduce these troubling effects and help you have the best quality of life possible.  You may receive a survey from a nurse that asks questions about your current state of health.  Based on the survey results, all eligible patients will be referred to the [facility] program for an evaluation so we can better serve you!  A frequently asked questions sheet is available upon request.

## 2014-03-18 NOTE — Progress Notes (Signed)
Curtis Lang presents today for injection per MD orders. Flu vaccine administered IM in left Upper Arm. Administration without incident. Patient tolerated well.

## 2014-03-19 ENCOUNTER — Ambulatory Visit (HOSPITAL_COMMUNITY): Payer: Medicare Other | Admitting: Oncology

## 2014-03-19 ENCOUNTER — Ambulatory Visit (INDEPENDENT_AMBULATORY_CARE_PROVIDER_SITE_OTHER): Payer: Medicare Other | Admitting: Cardiology

## 2014-03-19 ENCOUNTER — Encounter: Payer: Self-pay | Admitting: Cardiology

## 2014-03-19 VITALS — BP 147/76 | HR 78 | Ht 66.0 in | Wt 152.1 lb

## 2014-03-19 DIAGNOSIS — I5022 Chronic systolic (congestive) heart failure: Secondary | ICD-10-CM

## 2014-03-19 DIAGNOSIS — I4891 Unspecified atrial fibrillation: Secondary | ICD-10-CM

## 2014-03-19 DIAGNOSIS — I2510 Atherosclerotic heart disease of native coronary artery without angina pectoris: Secondary | ICD-10-CM

## 2014-03-19 MED ORDER — FUROSEMIDE 40 MG PO TABS: 40.0000 mg | ORAL_TABLET | Freq: Two times a day (BID) | ORAL | Status: DC

## 2014-03-19 MED ORDER — LISINOPRIL 40 MG PO TABS: 40.0000 mg | ORAL_TABLET | Freq: Every day | ORAL | Status: DC

## 2014-03-19 NOTE — Patient Instructions (Signed)
   Increase Lisinopril to 40mg  daily. Sent to pharmacy. Continue all other medications.   Your physician wants you to follow up in:  3 months.  You will receive a reminder letter in the mail one-two months in advance.  If you don't receive a letter, please call our office to schedule the follow up appointment

## 2014-03-19 NOTE — Progress Notes (Signed)
Clinical Summary Curtis Lang is a 80 y.o.male seen today for follow up of the following medical problems.   1. CAD/Chronic systolic HF - prior PCI to RCA in 1997 with cutting balloon to RCA in 2001, last cath 05/2009 with chronically occluded RCA with left to right collaterals and moderate disease of LAD and LCX. LV gram at that time showed LVEF 55% with inferior hypokinesis.  - admitted 05/15/13 to [facility] with chest pain, though patient was very poor historian and the history of symptoms was unclear. There was no evidence of ACS by EKG or cardiac enzymes. Echo showed new finding of severe LV systolic dysfunction, LVEF 25-30%.   - patient is very poor historian and is not able to describe if he experiences dyspnea, orthopnea, PND, or lower extremity edema.  - he apparently was not able to tolerate Toprol XL at 100mg  due to low heart rates and fatigue while at [facility], decreased to 50mg  daily and has done well  2. Afib  - CHADS2Vasc score of 5, he remains on eliquis without any bleeding issues.  - denies any palpitations, on Toprol for rate control  3. Lymphoma  -He is in remission currently, he is followed by oncology  4. Dementia - followed by neurology   Past Medical History  Diagnosis Date  . Arteriosclerotic cardiovascular disease (ASCVD)     BMS to RCA 1997; cutting ballon for RCA restenosis in 2001; cath 12/10 - 100% RCA L->R collaterals; nl EF; 40% LAD and OM1  . Hypertension   . Hyperlipidemia   . Cerebrovascular disease     bilateral bruits; duplex in 2009 - mild plaque w/o stenosis  . Peripheral vascular disease     ABI on 0.87 on left  . Tobacco abuse, in remission     60 pack years; discontinued in 2003  . COPD (chronic obstructive pulmonary disease)   . Fasting hyperglycemia   . DDD (degenerative disc disease), cervical   . Muscle discomfort     No improvement after statins temporary discontinued  . Hypercholesteremia   . Lymphoma     chemotherapy  and radiation therapy in 2006; recurrence in 2007  . Squamous cell carcinoma     s/p Mohs surgery  . Cancer     b cell lymphoma  . 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 Dementia   . CHF (congestive heart failure)   . Atrial fibrillation      Allergies  Allergen Reactions  . Nexium [Esomeprazole Magnesium]      Current Outpatient Prescriptions  Medication Sig Dispense Refill  . apixaban (ELIQUIS) 5 MG TABS tablet Take 1 tablet (5 mg total) by mouth 2 (two) times daily.  60 tablet  6  . atorvastatin (LIPITOR) 40 MG tablet TAKE 1 TABLET BY MOUTH EVERY DAY  30 tablet  2  . diphenhydrAMINE (BENADRYL) 25 mg capsule Take 25 mg by mouth every 6 (six) hours as needed.      . donepezil (ARICEPT) 5 MG tablet Take 10 mg by mouth at bedtime.       . furosemide (LASIX) 40 MG tablet Take 40 mg by mouth 2 (two) times daily. May take extra 40mg  for increase swelling, weight gain      . lisinopril (PRINIVIL,ZESTRIL) 20 MG tablet TAKE 1 TABLET BY MOUTH EVERY DAY  90 tablet  6  . memantine (NAMENDA TITRATION PAK) tablet pack 5 mg/day for =1 week; 5 mg twice daily for =1 week; 15  mg/day given in 5 mg and 10 mg separated doses for =1 week; then 10 mg twice daily  49 tablet  12  . metoprolol succinate (TOPROL-XL) 100 MG 24 hr tablet Take 1 tablet (100 mg total) by mouth daily. Take with or immediately following a meal.  90 tablet  3  . omeprazole (PRILOSEC) 20 MG capsule Take 20 mg by mouth daily.       No current facility-administered medications for this visit.     Past Surgical History  Procedure Laterality Date  . Anterior fusion cervical spine  2003    posterior?  . Inguinal hernia repair  1996    Right  . Orif femur fracture  1996  . Mohs surgery    . Colonoscopy  05/16/2011    Procedure: COLONOSCOPY;  Surgeon: Jamesetta So; diverticulosis, sigmoid polypectomy  . Port-a-cath removal Right 03/10/2013    Procedure: REMOVAL PORT-A-CATH;  Surgeon: Scherry Ran, MD;  Location: [facility];  Service: General;  Laterality: Right;  . Pyloric stenosis surgery      infant     Allergies  Allergen Reactions  . Nexium [Esomeprazole Magnesium]       Family History  Problem Relation Age of Onset  . Stroke Father     deceased - 12  . Stroke Mother     deceased - 29   . Heart attack Brother 79  . Heart attack Sister   . Colon cancer Neg Hx      Social History Mr. Ratto reports that he has quit smoking. He has never used smokeless tobacco. Mr. Gaertner reports that he does not drink alcohol.   Review of Systems CONSTITUTIONAL: No weight loss, fever, chills, weakness or fatigue.  HEENT: Eyes: No visual loss, blurred vision, double vision or yellow sclerae.No hearing loss, sneezing, congestion, runny nose or sore throat.  SKIN: No rash or itching.  CARDIOVASCULAR: per HPI RESPIRATORY: No shortness of breath, cough or sputum.  GASTROINTESTINAL: No anorexia, nausea, vomiting or diarrhea. No abdominal pain or blood.  GENITOURINARY: No burning on urination, no polyuria NEUROLOGICAL: No headache, dizziness, syncope, paralysis, ataxia, numbness or tingling in the extremities. No change in bowel or bladder control.  MUSCULOSKELETAL: + neck pain LYMPHATICS: No enlarged nodes. No history of splenectomy.  PSYCHIATRIC: No history of depression or anxiety.  ENDOCRINOLOGIC: No reports of sweating, cold or heat intolerance. No polyuria or polydipsia.  Marland Kitchen   Physical Examination p 78 bp 147/76 Wt 152 lbs BMI 25 Gen: resting comfortably, no acute distress HEENT: no scleral icterus, pupils equal round and reactive, no palptable cervical adenopathy,  CV: RRR, no m/r/g, no JVD, no carotid bruits Resp: Clear to auscultation bilaterally GI: abdomen is soft, non-tender, non-distended, normal bowel sounds, no hepatosplenomegaly MSK: extremities are warm, no edema.  Skin: warm, no rash Neuro:  no focal deficits Psych: appropriate affect   Diagnostic Studies 05/16/13 Echo  LVEF  25-30%, mild LVH, increased LA pressure, mild MR,mild RV dysfunction      Assessment and Plan  1. CAD/Systolic dysfunction  - prior history of CAD with interventions, recently found to have severe LV systolic dysfunction LVEF 25-30%. This is most likely secondary to ischemic cardiomyopathy  - unable to assess patient's symptoms as he is a very poor historian due to advanced dementia. We have not pursued aggressive invasive testing due to his severe dementia and mulitple comorbidities, currently managing medically.  - did not tolerate high dose of Toprol XL, continue at 50mg  daily. Will  increase lisionpril to 40mg  daily with BMET in 2 weeks  2. Afib  - continue rate control, patient with CHADS2Vascscore of 5, continue eliuqis  3. Lymphoma  - continue to follow with onc.  4. Dementia - continue to follow with neuro       Arnoldo Lenis, M.D.

## 2014-03-27 ENCOUNTER — Other Ambulatory Visit: Payer: Self-pay | Admitting: Cardiology

## 2014-03-27 ENCOUNTER — Other Ambulatory Visit: Payer: Self-pay | Admitting: *Deleted

## 2014-03-27 MED ORDER — APIXABAN 5 MG PO TABS: 5.0000 mg | ORAL_TABLET | Freq: Two times a day (BID) | ORAL | Status: DC

## 2014-05-18 ENCOUNTER — Telehealth: Payer: Self-pay | Admitting: Neurology

## 2014-05-18 NOTE — Telephone Encounter (Signed)
Pt daughter Tawanna Solo and canceled appt from 12--9-15 to 06-22-14 dana

## 2014-05-20 ENCOUNTER — Ambulatory Visit: Payer: 59 | Admitting: Neurology

## 2014-05-29 ENCOUNTER — Encounter (HOSPITAL_COMMUNITY): Payer: Self-pay | Admitting: Emergency Medicine

## 2014-05-29 ENCOUNTER — Inpatient Hospital Stay (HOSPITAL_COMMUNITY)
Admission: EM | Admit: 2014-05-29 | Discharge: 2014-06-02 | DRG: 871 | Disposition: A | Discharge status: Home / Self Care | Payer: Medicare Other | Attending: Internal Medicine | Admitting: Internal Medicine

## 2014-05-29 DIAGNOSIS — C8330 Diffuse large B-cell lymphoma, unspecified site: Secondary | ICD-10-CM | POA: Diagnosis present

## 2014-05-29 DIAGNOSIS — E780 Pure hypercholesterolemia: Secondary | ICD-10-CM | POA: Diagnosis present

## 2014-05-29 DIAGNOSIS — R509 Fever, unspecified: Secondary | ICD-10-CM | POA: Diagnosis not present

## 2014-05-29 DIAGNOSIS — E785 Hyperlipidemia, unspecified: Secondary | ICD-10-CM | POA: Diagnosis present

## 2014-05-29 DIAGNOSIS — Z9221 Personal history of antineoplastic chemotherapy: Secondary | ICD-10-CM

## 2014-05-29 DIAGNOSIS — I10 Essential (primary) hypertension: Secondary | ICD-10-CM | POA: Diagnosis present

## 2014-05-29 DIAGNOSIS — G9340 Encephalopathy, unspecified: Secondary | ICD-10-CM | POA: Diagnosis present

## 2014-05-29 DIAGNOSIS — I5022 Chronic systolic (congestive) heart failure: Secondary | ICD-10-CM | POA: Diagnosis present

## 2014-05-29 DIAGNOSIS — R4182 Altered mental status, unspecified: Secondary | ICD-10-CM | POA: Diagnosis present

## 2014-05-29 DIAGNOSIS — J449 Chronic obstructive pulmonary disease, unspecified: Secondary | ICD-10-CM | POA: Diagnosis present

## 2014-05-29 DIAGNOSIS — Z923 Personal history of irradiation: Secondary | ICD-10-CM

## 2014-05-29 DIAGNOSIS — J189 Pneumonia, unspecified organism: Secondary | ICD-10-CM | POA: Diagnosis present

## 2014-05-29 DIAGNOSIS — Z87891 Personal history of nicotine dependence: Secondary | ICD-10-CM

## 2014-05-29 DIAGNOSIS — I4891 Unspecified atrial fibrillation: Secondary | ICD-10-CM | POA: Diagnosis present

## 2014-05-29 DIAGNOSIS — I739 Peripheral vascular disease, unspecified: Secondary | ICD-10-CM | POA: Diagnosis present

## 2014-05-29 DIAGNOSIS — Z888 Allergy status to other drugs, medicaments and biological substances status: Secondary | ICD-10-CM

## 2014-05-29 DIAGNOSIS — A419 Sepsis, unspecified organism: Principal | ICD-10-CM | POA: Diagnosis present

## 2014-05-29 DIAGNOSIS — F0390 Unspecified dementia without behavioral disturbance: Secondary | ICD-10-CM | POA: Diagnosis present

## 2014-05-29 DIAGNOSIS — Z8572 Personal history of non-Hodgkin lymphomas: Secondary | ICD-10-CM

## 2014-05-29 DIAGNOSIS — B349 Viral infection, unspecified: Secondary | ICD-10-CM | POA: Diagnosis present

## 2014-05-29 DIAGNOSIS — I2510 Atherosclerotic heart disease of native coronary artery without angina pectoris: Secondary | ICD-10-CM | POA: Diagnosis present

## 2014-05-29 DIAGNOSIS — Z8673 Personal history of transient ischemic attack (TIA), and cerebral infarction without residual deficits: Secondary | ICD-10-CM

## 2014-05-29 DIAGNOSIS — Z79899 Other long term (current) drug therapy: Secondary | ICD-10-CM

## 2014-05-29 DIAGNOSIS — I482 Chronic atrial fibrillation, unspecified: Secondary | ICD-10-CM

## 2014-05-29 DIAGNOSIS — E46 Unspecified protein-calorie malnutrition: Secondary | ICD-10-CM | POA: Diagnosis present

## 2014-05-29 DIAGNOSIS — I5023 Acute on chronic systolic (congestive) heart failure: Secondary | ICD-10-CM | POA: Diagnosis present

## 2014-05-29 DIAGNOSIS — K219 Gastro-esophageal reflux disease without esophagitis: Secondary | ICD-10-CM | POA: Diagnosis present

## 2014-05-29 DIAGNOSIS — J811 Chronic pulmonary edema: Secondary | ICD-10-CM | POA: Diagnosis present

## 2014-05-29 NOTE — ED Notes (Signed)
Pt is more confused tonight than normal. Pt had violent shivering that started at 1900.

## 2014-05-30 ENCOUNTER — Emergency Department (HOSPITAL_COMMUNITY): Payer: Medicare Other

## 2014-05-30 ENCOUNTER — Encounter (HOSPITAL_COMMUNITY): Payer: Self-pay | Admitting: *Deleted

## 2014-05-30 DIAGNOSIS — B349 Viral infection, unspecified: Secondary | ICD-10-CM | POA: Diagnosis present

## 2014-05-30 DIAGNOSIS — J449 Chronic obstructive pulmonary disease, unspecified: Secondary | ICD-10-CM | POA: Diagnosis present

## 2014-05-30 DIAGNOSIS — I5023 Acute on chronic systolic (congestive) heart failure: Secondary | ICD-10-CM | POA: Diagnosis present

## 2014-05-30 DIAGNOSIS — R509 Fever, unspecified: Secondary | ICD-10-CM | POA: Diagnosis present

## 2014-05-30 DIAGNOSIS — J811 Chronic pulmonary edema: Secondary | ICD-10-CM

## 2014-05-30 DIAGNOSIS — F0390 Unspecified dementia without behavioral disturbance: Secondary | ICD-10-CM | POA: Diagnosis present

## 2014-05-30 DIAGNOSIS — Z79899 Other long term (current) drug therapy: Secondary | ICD-10-CM | POA: Diagnosis not present

## 2014-05-30 DIAGNOSIS — E46 Unspecified protein-calorie malnutrition: Secondary | ICD-10-CM | POA: Diagnosis present

## 2014-05-30 DIAGNOSIS — IMO0001 Reserved for inherently not codable concepts without codable children: Secondary | ICD-10-CM | POA: Insufficient documentation

## 2014-05-30 DIAGNOSIS — E780 Pure hypercholesterolemia: Secondary | ICD-10-CM | POA: Diagnosis present

## 2014-05-30 DIAGNOSIS — Z8673 Personal history of transient ischemic attack (TIA), and cerebral infarction without residual deficits: Secondary | ICD-10-CM | POA: Diagnosis not present

## 2014-05-30 DIAGNOSIS — I4891 Unspecified atrial fibrillation: Secondary | ICD-10-CM

## 2014-05-30 DIAGNOSIS — I2510 Atherosclerotic heart disease of native coronary artery without angina pectoris: Secondary | ICD-10-CM | POA: Diagnosis present

## 2014-05-30 DIAGNOSIS — C8330 Diffuse large B-cell lymphoma, unspecified site: Secondary | ICD-10-CM | POA: Diagnosis present

## 2014-05-30 DIAGNOSIS — I739 Peripheral vascular disease, unspecified: Secondary | ICD-10-CM | POA: Diagnosis present

## 2014-05-30 DIAGNOSIS — A419 Sepsis, unspecified organism: Secondary | ICD-10-CM | POA: Diagnosis present

## 2014-05-30 DIAGNOSIS — J189 Pneumonia, unspecified organism: Secondary | ICD-10-CM | POA: Diagnosis present

## 2014-05-30 DIAGNOSIS — Z9221 Personal history of antineoplastic chemotherapy: Secondary | ICD-10-CM | POA: Diagnosis not present

## 2014-05-30 DIAGNOSIS — Z923 Personal history of irradiation: Secondary | ICD-10-CM | POA: Diagnosis not present

## 2014-05-30 DIAGNOSIS — R4182 Altered mental status, unspecified: Secondary | ICD-10-CM

## 2014-05-30 DIAGNOSIS — I482 Chronic atrial fibrillation: Secondary | ICD-10-CM

## 2014-05-30 DIAGNOSIS — K219 Gastro-esophageal reflux disease without esophagitis: Secondary | ICD-10-CM | POA: Diagnosis present

## 2014-05-30 DIAGNOSIS — I10 Essential (primary) hypertension: Secondary | ICD-10-CM | POA: Diagnosis present

## 2014-05-30 DIAGNOSIS — E785 Hyperlipidemia, unspecified: Secondary | ICD-10-CM | POA: Diagnosis present

## 2014-05-30 DIAGNOSIS — Z87891 Personal history of nicotine dependence: Secondary | ICD-10-CM | POA: Diagnosis not present

## 2014-05-30 DIAGNOSIS — Z8572 Personal history of non-Hodgkin lymphomas: Secondary | ICD-10-CM | POA: Diagnosis not present

## 2014-05-30 DIAGNOSIS — Z888 Allergy status to other drugs, medicaments and biological substances status: Secondary | ICD-10-CM | POA: Diagnosis not present

## 2014-05-30 DIAGNOSIS — G9340 Encephalopathy, unspecified: Secondary | ICD-10-CM | POA: Diagnosis present

## 2014-05-30 LAB — CBC WITH DIFFERENTIAL/PLATELET
Basophils Absolute: 0 10*3/uL (ref 0.0–0.1)
Basophils Relative: 0 % (ref 0–1)
Eosinophils Absolute: 0.2 10*3/uL (ref 0.0–0.7)
Eosinophils Relative: 2 % (ref 0–5)
HCT: 37.2 % — ABNORMAL LOW (ref 39.0–52.0)
HEMOGLOBIN: 12.2 g/dL — AB (ref 13.0–17.0)
LYMPHS ABS: 0.5 10*3/uL — AB (ref 0.7–4.0)
Lymphocytes Relative: 5 % — ABNORMAL LOW (ref 12–46)
MCH: 32.2 pg (ref 26.0–34.0)
MCHC: 32.8 g/dL (ref 30.0–36.0)
MCV: 98.2 fL (ref 78.0–100.0)
Monocytes Absolute: 0.7 10*3/uL (ref 0.1–1.0)
Monocytes Relative: 8 % (ref 3–12)
NEUTROS ABS: 7 10*3/uL (ref 1.7–7.7)
NEUTROS PCT: 85 % — AB (ref 43–77)
Platelets: 230 10*3/uL (ref 150–400)
RBC: 3.79 MIL/uL — ABNORMAL LOW (ref 4.22–5.81)
RDW: 13.8 % (ref 11.5–15.5)
WBC: 8.3 10*3/uL (ref 4.0–10.5)

## 2014-05-30 LAB — TROPONIN I
Troponin I: <0.3 ng/mL (ref <0.30)
Troponin I: <0.3 ng/mL (ref <0.30)
Troponin I: <0.3 ng/mL (ref <0.30)

## 2014-05-30 LAB — COMPREHENSIVE METABOLIC PANEL
ALBUMIN: 3.4 g/dL — AB (ref 3.5–5.2)
ALT: 13 U/L (ref 0–53)
ANION GAP: 15 (ref 5–15)
AST: 15 U/L (ref 0–37)
Alkaline Phosphatase: 113 U/L (ref 39–117)
BUN: 8 mg/dL (ref 6–23)
CO2: 25 mEq/L (ref 19–32)
Calcium: 8.8 mg/dL (ref 8.4–10.5)
Chloride: 99 mEq/L (ref 96–112)
Creatinine, Ser: 1.01 mg/dL (ref 0.50–1.35)
GFR calc non Af Amer: 68 mL/min — ABNORMAL LOW (ref >90)
GFR, EST AFRICAN AMERICAN: 78 mL/min — AB (ref >90)
Glucose, Bld: 154 mg/dL — ABNORMAL HIGH (ref 70–99)
POTASSIUM: 3.7 meq/L (ref 3.7–5.3)
SODIUM: 139 meq/L (ref 137–147)
TOTAL PROTEIN: 7.5 g/dL (ref 6.0–8.3)
Total Bilirubin: 0.6 mg/dL (ref 0.3–1.2)

## 2014-05-30 LAB — INFLUENZA PANEL BY PCR (TYPE A & B)
H1N1FLUPCR: NOT DETECTED
INFLBPCR: NEGATIVE
Influenza A By PCR: NEGATIVE

## 2014-05-30 LAB — URINALYSIS, ROUTINE W REFLEX MICROSCOPIC
BILIRUBIN URINE: NEGATIVE
Glucose, UA: NEGATIVE mg/dL
Ketones, ur: NEGATIVE mg/dL
Leukocytes, UA: NEGATIVE
Nitrite: NEGATIVE
PH: 6.5 (ref 5.0–8.0)
Protein, ur: 30 mg/dL — AB
SPECIFIC GRAVITY, URINE: 1.02 (ref 1.005–1.030)
Urobilinogen, UA: 0.2 mg/dL (ref 0.0–1.0)

## 2014-05-30 LAB — URINE MICROSCOPIC-ADD ON

## 2014-05-30 LAB — LACTIC ACID, PLASMA: LACTIC ACID, VENOUS: 2.8 mmol/L — AB (ref 0.5–2.2)

## 2014-05-30 LAB — STREP PNEUMONIAE URINARY ANTIGEN: Strep Pneumo Urinary Antigen: NEGATIVE

## 2014-05-30 LAB — PRO B NATRIURETIC PEPTIDE: Pro B Natriuretic peptide (BNP): 6684 pg/mL — ABNORMAL HIGH (ref 0–450)

## 2014-05-30 MED ORDER — ACETAMINOPHEN 650 MG RE SUPP: 650.0000 mg | Freq: Four times a day (QID) | RECTAL | Status: DC | PRN

## 2014-05-30 MED ORDER — DEXTROSE 5 % IV SOLN
500.0000 mg | INTRAVENOUS | Status: DC
  Administered 2014-05-31 – 2014-06-02 (×3): 500 mg via INTRAVENOUS
  Filled 2014-05-30 (×4): qty 500

## 2014-05-30 MED ORDER — DEXTROSE 5 % IV SOLN
500.0000 mg | Freq: Once | INTRAVENOUS | Status: AC
  Administered 2014-05-30: 500 mg via INTRAVENOUS
  Filled 2014-05-30: qty 500

## 2014-05-30 MED ORDER — DEXTROSE 5 % IV SOLN
1.0000 g | Freq: Once | INTRAVENOUS | Status: AC
  Administered 2014-05-30: 1 g via INTRAVENOUS
  Filled 2014-05-30: qty 10

## 2014-05-30 MED ORDER — SODIUM CHLORIDE 0.9 % IJ SOLN: 3.0000 mL | INTRAMUSCULAR | Status: DC | PRN

## 2014-05-30 MED ORDER — ONDANSETRON HCL 4 MG PO TABS: 4.0000 mg | ORAL_TABLET | Freq: Four times a day (QID) | ORAL | Status: DC | PRN

## 2014-05-30 MED ORDER — FUROSEMIDE 20 MG PO TABS
60.0000 mg | ORAL_TABLET | Freq: Two times a day (BID) | ORAL | Status: DC
  Administered 2014-05-30 – 2014-05-31 (×2): 60 mg via ORAL
  Filled 2014-05-30 (×4): qty 1

## 2014-05-30 MED ORDER — LISINOPRIL 10 MG PO TABS
40.0000 mg | ORAL_TABLET | Freq: Every day | ORAL | Status: DC
  Administered 2014-05-30 – 2014-06-02 (×4): 40 mg via ORAL
  Filled 2014-05-30 (×4): qty 4

## 2014-05-30 MED ORDER — ONDANSETRON HCL 4 MG/2ML IJ SOLN: 4.0000 mg | Freq: Four times a day (QID) | INTRAMUSCULAR | Status: DC | PRN

## 2014-05-30 MED ORDER — SODIUM CHLORIDE 0.9 % IV SOLN
250.0000 mL | INTRAVENOUS | Status: DC | PRN
Start: 2014-05-30 — End: 2014-06-02

## 2014-05-30 MED ORDER — ACETAMINOPHEN 325 MG PO TABS
650.0000 mg | ORAL_TABLET | Freq: Four times a day (QID) | ORAL | Status: DC | PRN
  Administered 2014-06-01 – 2014-06-02 (×2): 650 mg via ORAL
  Filled 2014-05-30 (×2): qty 2

## 2014-05-30 MED ORDER — SODIUM CHLORIDE 0.9 % IJ SOLN
3.0000 mL | Freq: Two times a day (BID) | INTRAMUSCULAR | Status: DC
  Administered 2014-05-30 – 2014-06-01 (×6): 3 mL via INTRAVENOUS

## 2014-05-30 MED ORDER — APIXABAN 5 MG PO TABS
5.0000 mg | ORAL_TABLET | Freq: Two times a day (BID) | ORAL | Status: DC
  Administered 2014-05-30 – 2014-06-02 (×7): 5 mg via ORAL
  Filled 2014-05-30 (×7): qty 1

## 2014-05-30 MED ORDER — METOPROLOL SUCCINATE ER 50 MG PO TB24
50.0000 mg | ORAL_TABLET | Freq: Every day | ORAL | Status: DC
  Administered 2014-05-30 – 2014-06-02 (×4): 50 mg via ORAL
  Filled 2014-05-30 (×4): qty 1

## 2014-05-30 MED ORDER — PANTOPRAZOLE SODIUM 40 MG PO TBEC
40.0000 mg | DELAYED_RELEASE_TABLET | Freq: Every day | ORAL | Status: DC
  Administered 2014-05-30 – 2014-06-02 (×4): 40 mg via ORAL
  Filled 2014-05-30 (×4): qty 1

## 2014-05-30 MED ORDER — CEFTRIAXONE SODIUM IN DEXTROSE 20 MG/ML IV SOLN
1.0000 g | INTRAVENOUS | Status: DC
  Administered 2014-05-31 – 2014-06-02 (×3): 1 g via INTRAVENOUS
  Filled 2014-05-30 (×4): qty 50

## 2014-05-30 MED ORDER — ATORVASTATIN CALCIUM 40 MG PO TABS
40.0000 mg | ORAL_TABLET | Freq: Every day | ORAL | Status: DC
  Administered 2014-05-30 – 2014-06-02 (×4): 40 mg via ORAL
  Filled 2014-05-30 (×4): qty 1

## 2014-05-30 MED ORDER — DONEPEZIL HCL 5 MG PO TABS
10.0000 mg | ORAL_TABLET | Freq: Every day | ORAL | Status: DC
  Administered 2014-05-30 – 2014-06-01 (×3): 10 mg via ORAL
  Filled 2014-05-30 (×3): qty 2

## 2014-05-30 MED ORDER — FUROSEMIDE 40 MG PO TABS
40.0000 mg | ORAL_TABLET | Freq: Two times a day (BID) | ORAL | Status: DC
  Administered 2014-05-30: 40 mg via ORAL
  Filled 2014-05-30: qty 1

## 2014-05-30 NOTE — ED Provider Notes (Signed)
CSN: [id number]     Arrival date & time 05/29/14  2340 History   First MD Initiated Contact with Patient 05/29/14 2342     Chief Complaint  Patient presents with  . Altered Mental Status     (Consider location/radiation/quality/duration/timing/severity/associated sxs/prior Treatment) Patient is a 80 y.o. male presenting with altered mental status. The history is provided by a relative. The history is limited by the condition of the patient (Altered mental status).  Altered Mental Status He was in his normal state of health until this evening when he started having shaking chills. Following that, he seemed somewhat disoriented. He did not eat his dinner doubly normally does. His son took him home but he seemed lost and did not know where to go. He has not had any known fever or cough. There's been no vomiting or diarrhea.  Past Medical History  Diagnosis Date  . Arteriosclerotic cardiovascular disease (ASCVD)     BMS to RCA 1997; cutting ballon for RCA restenosis in 2001; cath 12/10 - 100% RCA L->R collaterals; nl EF; 40% LAD and OM1  . Hypertension   . Hyperlipidemia   . Cerebrovascular disease     bilateral bruits; duplex in 2009 - mild plaque w/o stenosis  . Peripheral vascular disease     ABI on 0.87 on left  . Tobacco abuse, in remission     60 pack years; discontinued in 2003  . COPD (chronic obstructive pulmonary disease)   . Fasting hyperglycemia   . DDD (degenerative disc disease), cervical   . Muscle discomfort     No improvement after statins temporary discontinued  . Hypercholesteremia   . Lymphoma     chemotherapy and radiation therapy in 2006; recurrence in 2007  . Squamous cell carcinoma     s/p Mohs surgery  . Cancer     b cell lymphoma  . 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 Dementia   . CHF (congestive heart failure)   . Atrial fibrillation    Past Surgical History  Procedure Laterality Date  . Anterior fusion cervical spine  2003    posterior?   . Inguinal hernia repair  1996    Right  . Orif femur fracture  1996  . Mohs surgery    . Colonoscopy  05/16/2011    Procedure: COLONOSCOPY;  Surgeon: Jamesetta So; diverticulosis, sigmoid polypectomy  . Port-a-cath removal Right 03/10/2013    Procedure: REMOVAL PORT-A-CATH;  Surgeon: Scherry Ran, MD;  Location: [facility];  Service: General;  Laterality: Right;  . Pyloric stenosis surgery      infant   Family History  Problem Relation Age of Onset  . Stroke Father     deceased - 19  . Stroke Mother     deceased - 66   . Heart attack Brother 24  . Heart attack Sister   . Colon cancer Neg Hx    History  Substance Use Topics  . Smoking status: Former Smoker -- 1.00 packs/day for 30 years  . Smokeless tobacco: Never Used  . Alcohol Use: No    Review of Systems  Unable to perform ROS: Mental status change      Allergies  Nexium  Home Medications   Prior to Admission medications   Medication Sig Start Date End Date Taking? Authorizing Provider  apixaban (ELIQUIS) 5 MG TABS tablet Take 1 tablet (5 mg total) by mouth 2 (two) times daily. 03/27/14  Yes Arnoldo Lenis, MD  atorvastatin (LIPITOR) 40  MG tablet TAKE 1 TABLET BY MOUTH EVERY DAY 01/27/14  Yes Arnoldo Lenis, MD  donepezil (ARICEPT) 10 MG tablet Take 10 mg by mouth at bedtime.   Yes Historical Provider, MD  furosemide (LASIX) 40 MG tablet Take 1 tablet (40 mg total) by mouth 2 (two) times daily. 03/19/14  Yes Arnoldo Lenis, MD  lisinopril (PRINIVIL,ZESTRIL) 40 MG tablet Take 1 tablet (40 mg total) by mouth daily. 03/19/14  Yes Arnoldo Lenis, MD  metoprolol succinate (TOPROL-XL) 50 MG 24 hr tablet Take 50 mg by mouth daily.   Yes Historical Provider, MD  omeprazole (PRILOSEC) 20 MG capsule Take 20 mg by mouth every morning.  02/11/13  Yes Historical Provider, MD  diphenhydrAMINE (BENADRYL) 25 mg capsule Take 25 mg by mouth every 6 (six) hours as needed.    Historical Provider, MD   BP 150/83 mmHg   Pulse 68  Temp(Src) 99.1 F (37.3 C) (Oral)  Resp 20  Ht 5\' 6"  (1.676 m)  Wt 150 lb (68.04 kg)  BMI 24.22 kg/m2  SpO2 93% Physical Exam  Nursing note and vitals reviewed.  80 year old male, resting comfortably and in no acute distress. Vital signs are significant for hypertension. Oxygen saturation is 93%, which is normal. Head is normocephalic and atraumatic. PERRLA, EOMI. Oropharynx is clear. Mucous membranes appear dry. Neck is nontender and supple without adenopathy or JVD. Back is nontender and there is no CVA tenderness. Lungs are clear without rales, wheezes, or rhonchi. Chest is nontender. Heart has an irregular rhythm without murmur. Abdomen is soft, flat, nontender without masses or hepatosplenomegaly and peristalsis is normoactive. Extremities have no cyanosis or edema, full range of motion is present. Skin is warm and dry without rash. Neurologic: He is awake, alert, oriented to person but not place or time, cranial nerves are intact, there are no motor or sensory deficits.  ED Course  Procedures (including critical care time) Labs Review Results for orders placed or performed during the hospital encounter of 05/29/14  Comprehensive metabolic panel  Result Value Ref Range   Sodium 139 137 - 147 mEq/L   Potassium 3.7 3.7 - 5.3 mEq/L   Chloride 99 96 - 112 mEq/L   CO2 25 19 - 32 mEq/L   Glucose, Bld 154 (H) 70 - 99 mg/dL   BUN 8 6 - 23 mg/dL   Creatinine, Ser 1.01 0.50 - 1.35 mg/dL   Calcium 8.8 8.4 - 10.5 mg/dL   Total Protein 7.5 6.0 - 8.3 g/dL   Albumin 3.4 (L) 3.5 - 5.2 g/dL   AST 15 0 - 37 U/L   ALT 13 0 - 53 U/L   Alkaline Phosphatase 113 39 - 117 U/L   Total Bilirubin 0.6 0.3 - 1.2 mg/dL   GFR calc non Af Amer 68 (L) >90 mL/min   GFR calc Af Amer 78 (L) >90 mL/min   Anion gap 15 5 - 15  CBC with Differential  Result Value Ref Range   WBC 8.3 4.0 - 10.5 K/uL   RBC 3.79 (L) 4.22 - 5.81 MIL/uL   Hemoglobin 12.2 (L) 13.0 - 17.0 g/dL   HCT 37.2 (L) 39.0  - 52.0 %   MCV 98.2 78.0 - 100.0 fL   MCH 32.2 26.0 - 34.0 pg   MCHC 32.8 30.0 - 36.0 g/dL   RDW 13.8 11.5 - 15.5 %   Platelets 230 150 - 400 K/uL   Neutrophils Relative % 85 (H) 43 - 77 %  Neutro Abs 7.0 1.7 - 7.7 K/uL   Lymphocytes Relative 5 (L) 12 - 46 %   Lymphs Abs 0.5 (L) 0.7 - 4.0 K/uL   Monocytes Relative 8 3 - 12 %   Monocytes Absolute 0.7 0.1 - 1.0 K/uL   Eosinophils Relative 2 0 - 5 %   Eosinophils Absolute 0.2 0.0 - 0.7 K/uL   Basophils Relative 0 0 - 1 %   Basophils Absolute 0.0 0.0 - 0.1 K/uL  Urinalysis, Routine w reflex microscopic  Result Value Ref Range   Color, Urine YELLOW YELLOW   APPearance CLEAR CLEAR   Specific Gravity, Urine 1.020 1.005 - 1.030   pH 6.5 5.0 - 8.0   Glucose, UA NEGATIVE NEGATIVE mg/dL   Hgb urine dipstick MODERATE (A) NEGATIVE   Bilirubin Urine NEGATIVE NEGATIVE   Ketones, ur NEGATIVE NEGATIVE mg/dL   Protein, ur 30 (A) NEGATIVE mg/dL   Urobilinogen, UA 0.2 0.0 - 1.0 mg/dL   Nitrite NEGATIVE NEGATIVE   Leukocytes, UA NEGATIVE NEGATIVE  Lactic acid, plasma  Result Value Ref Range   Lactic Acid, Venous 2.8 (H) 0.5 - 2.2 mmol/L  Urine microscopic-add on  Result Value Ref Range   Squamous Epithelial / LPF RARE RARE   WBC, UA 0-2 <3 WBC/hpf   RBC / HPF 7-10 <3 RBC/hpf   Bacteria, UA RARE RARE   Imaging Review Ct Head Wo Contrast  05/30/2014   CLINICAL DATA:  Worsening confusion. Altered mental status, acute onset. Shivering. Initial encounter.  EXAM: CT HEAD WITHOUT CONTRAST  TECHNIQUE: Contiguous axial images were obtained from the base of the skull through the vertex without intravenous contrast.  COMPARISON:  CT of the head from 04/22/2013, and MRI of the cervical spine performed 04/17/2013  FINDINGS: There is no evidence of acute infarction, mass lesion, or intra- or extra-axial hemorrhage on CT.  Prominence of the ventricles and sulci reflects moderate cortical volume loss. Cerebellar atrophy is noted. Scattered periventricular  white matter change likely reflects small vessel ischemic microangiopathy.  The brainstem and fourth ventricle are within normal limits. The basal ganglia are unremarkable in appearance. The cerebral hemispheres demonstrate grossly normal gray-white differentiation. No mass effect or midline shift is seen.  Hypertrophic changes along the posterior aspect of the odontoid process and near the tip of the clivus are grossly unchanged from 2014, with mild flattening of the upper cervical spinal cord as previously described.  There is no evidence of fracture; visualized osseous structures are unremarkable in appearance. The visualized portions of the orbits are within normal limits. The paranasal sinuses and mastoid air cells are well-aerated. No significant soft tissue abnormalities are seen.  IMPRESSION: 1. No acute intracranial pathology seen on CT. 2. Moderate cortical volume loss and scattered small vessel ischemic microangiopathy. 3. Hypertrophic changes along the posterior aspect of the odontoid process and near the tip of the clivus are grossly unchanged from 2014, with mild flattening of the upper cervical spinal cord as previously described.   Electronically Signed   By: Garald Balding M.D.   On: 05/30/2014 00:59   Dg Chest Port 1 View  05/30/2014   CLINICAL DATA:  Acute onset of altered mental status, with shivering and weakness. Initial encounter.  EXAM: PORTABLE CHEST - 1 VIEW  COMPARISON:  Chest radiograph performed 09/05/2013  FINDINGS: The lungs are well-aerated. Mild vascular congestion is noted, with increased interstitial markings, possibly reflecting minimal interstitial edema. There is no evidence of pleural effusion or pneumothorax.  The cardiomediastinal silhouette is  borderline normal in size. No acute osseous abnormalities are seen. Clips are noted at both axilla.  IMPRESSION: Mild vascular congestion, with increased interstitial markings, possibly reflecting minimal interstitial edema.    Electronically Signed   By: Garald Balding M.D.   On: 05/30/2014 00:33   Images viewed by me.   EKG Interpretation   Date/Time:  Friday May 29 2014 23:53:27 EST Ventricular Rate:  106 PR Interval:    QRS Duration: 124 QT Interval:  394 QTC Calculation: 523 R Axis:   -58 Text Interpretation:  Atrial fibrillation Nonspecific IVCD with LAD  Anteroseptal infarct, old Nonspecific T abnormalities, lateral leads  Baseline wander in lead(s) V2 V4 V5 V6 When compared with ECG of  09/05/2013, T wave abnormality now less evident in the  Anterolateral leads  Confirmed by [facility]  MD, Twylah Bennetts ([id number]) on 05/30/2014 12:01:33 AM      MDM   Final diagnoses:  Altered mental status, unspecified altered mental status type  Fever, unspecified fever cause  Chronic atrial fibrillation    Altered mentation following shaking chill. This is suspicious for infection with urinary tract infection being the most likely. Chest x-ray and urinalysis will be obtained. Old records are reviewed and he has a history of lymphoma with no recent disease activity and also history of atrial fibrillation. Monitor shows ongoing atrial fibrillation.  Chest x-ray shows no evidence of pneumonia and urinalysis shows no evidence of urinary tract infection. I suspect occult pneumonia. Rectal temperature does show presence of fever of 101.7. Lactic acid level is mildly elevated but not indicative of sepsis. Chest x-ray does show evidence of CHF and he has a history of CHF, so aggressive fluid resuscitation is not given. I suspect that there is an occult pneumonia so he is started on empiric antibiotics for community-acquired pneumonia with ceftriaxone and azithromycin. Case is discussed with Dr. Darrick Meigs of [facility] who agrees to admit the patient.  Delora Fuel, MD 32/44/01 0272

## 2014-05-30 NOTE — Progress Notes (Signed)
Bell left for [facility]) regarding pt's new order for consult to dietician.

## 2014-05-30 NOTE — Progress Notes (Signed)
[facility] PROGRESS NOTE  Curtis Lang JAS:[id number] DOB: [birth year] DOA: 05/29/2014 PCP: Purvis Kilts, MD  Assessment/Plan: Sepsis with Unexplained fever Fever with tachycardia, encephalopathy and elevated lactic acid meeting criteria for sepsis Differential includes possible pneumonia versus acute viral infection. Placed on empiric Rocephin and azithromycin. Follow blood cultures, urine cultures. Check urine strep antigen and Legionella. Flu PCR negative. Repeat PA and lateral chest x-ray in a.m. -UA unremarkable. Monitor for any further changes in mental status. -Repeat lactic acid in a.m.  Acute encephalopathy Possibly secondary to sepsis. Patient does have underlying dementia. Head CT negative. UA unremarkable.  Acute on chronic systolic CHF Chest x-ray showing pulmonary edema and has bibasilar crackles on exam. Patient does not appear short of breath on exam. Will increase Lasix to 60 mg twice a day (on 40 mg twice a day at home). Elevated pro BNP noted. 2-D echo from 2014 shows EF of 25 and 30%..  Continue lisinopril, metoprolol. monitor daily weight and strict I/O  Atrial fibrillation Rate controlled. Continue Eliquis  Coronary artery disease Continue metoprolol and lisinopril. Continue statin. Serial troponins are negative.  History of B-cell lymphoma   Dementia Mild to moderate. Continue Aricept.  GERD Continue PPI  Weakness and malnutrition PT and nutrition consult   Code Status: Full code Family Communication: None at bedside Disposition Plan: Home once improved   Consultants:  None  Procedures:  Head CT  Antibiotics:  rocephin and azithro 12/19--  HPI/Subjective: Patient seen and examined. Admission H&P reviewed. Appears confused  Objective: Filed Vitals:   05/30/14 1335  BP: 125/70  Pulse: 82  Temp: 98.5 F (36.9 C)  Resp: 18    Intake/Output Summary (Last 24 hours) at 05/30/14 1509 Last data filed at 05/30/14 1200   Gross per 24 hour  Intake    240 ml  Output    125 ml  Net    115 ml   Filed Weights   05/29/14 2354 05/30/14 0403  Weight: 68.04 kg (150 lb) 68.7 kg (151 lb 7.3 oz)    Exam:   General: Curtis Lang lying in bed in no acute distress  H he no pallor, moist oral mucosa  Chest: Bibasilar crackles, no rhonchi or wheeze  CVS: S1 and S2 irregular, no murmurs, rubs or gallop  Abdomen: Soft, nondistended, nontender, bowel sounds present  Extremities: Warm, no edema  CNS: AAO 1-2, nonfocal   Data Reviewed: Basic Metabolic Panel:  Recent Labs Lab 05/30/14 0018  NA 139  K 3.7  CL 99  CO2 25  GLUCOSE 154*  BUN 8  CREATININE 1.01  CALCIUM 8.8   Liver Function Tests:  Recent Labs Lab 05/30/14 0018  AST 15  ALT 13  ALKPHOS 113  BILITOT 0.6  PROT 7.5  ALBUMIN 3.4*   No results for input(s): LIPASE, AMYLASE in the last 168 hours. No results for input(s): AMMONIA in the last 168 hours. CBC:  Recent Labs Lab 05/30/14 0018  WBC 8.3  NEUTROABS 7.0  HGB 12.2*  HCT 37.2*  MCV 98.2  PLT 230   Cardiac Enzymes:  Recent Labs Lab 05/30/14 0539 05/30/14 1023  TROPONINI <0.30 <0.30   BNP (last 3 results)  Recent Labs  09/05/13 1118 05/30/14 0539  PROBNP 16211.0* 6684.0*   CBG: No results for input(s): GLUCAP in the last 168 hours.  Recent Results (from the past 240 hour(s))  Culture, blood (routine x 2)     Status: None (Preliminary result)   Collection Time: 05/30/14  5:33 AM  Result Value Ref Range Status   Specimen Description BLOOD LEFT ARM  Final   Special Requests BOTTLES DRAWN AEROBIC AND ANAEROBIC 8CC BOTTLES  Final   Culture PENDING  Incomplete   Report Status PENDING  Incomplete  Culture, blood (routine x 2)     Status: None (Preliminary result)   Collection Time: 05/30/14  5:44 AM  Result Value Ref Range Status   Specimen Description BLOOD LEFT ARM  Final   Special Requests BOTTLES DRAWN AEROBIC ONLY 6CC BOTTLE  Final   Culture PENDING   Incomplete   Report Status PENDING  Incomplete     Studies: Ct Head Wo Contrast  05/30/2014   CLINICAL DATA:  Worsening confusion. Altered mental status, acute onset. Shivering. Initial encounter.  EXAM: CT HEAD WITHOUT CONTRAST  TECHNIQUE: Contiguous axial images were obtained from the base of the skull through the vertex without intravenous contrast.  COMPARISON:  CT of the head from 04/22/2013, and MRI of the cervical spine performed 04/17/2013  FINDINGS: There is no evidence of acute infarction, mass lesion, or intra- or extra-axial hemorrhage on CT.  Prominence of the ventricles and sulci reflects moderate cortical volume loss. Cerebellar atrophy is noted. Scattered periventricular white matter change likely reflects small vessel ischemic microangiopathy.  The brainstem and fourth ventricle are within normal limits. The basal ganglia are unremarkable in appearance. The cerebral hemispheres demonstrate grossly normal gray-white differentiation. No mass effect or midline shift is seen.  Hypertrophic changes along the posterior aspect of the odontoid process and near the tip of the clivus are grossly unchanged from 2014, with mild flattening of the upper cervical spinal cord as previously described.  There is no evidence of fracture; visualized osseous structures are unremarkable in appearance. The visualized portions of the orbits are within normal limits. The paranasal sinuses and mastoid air cells are well-aerated. No significant soft tissue abnormalities are seen.  IMPRESSION: 1. No acute intracranial pathology seen on CT. 2. Moderate cortical volume loss and scattered small vessel ischemic microangiopathy. 3. Hypertrophic changes along the posterior aspect of the odontoid process and near the tip of the clivus are grossly unchanged from 2014, with mild flattening of the upper cervical spinal cord as previously described.   Electronically Signed   By: Garald Balding M.D.   On: 05/30/2014 00:59   Dg  Chest Port 1 View  05/30/2014   CLINICAL DATA:  Acute onset of altered mental status, with shivering and weakness. Initial encounter.  EXAM: PORTABLE CHEST - 1 VIEW  COMPARISON:  Chest radiograph performed 09/05/2013  FINDINGS: The lungs are well-aerated. Mild vascular congestion is noted, with increased interstitial markings, possibly reflecting minimal interstitial edema. There is no evidence of pleural effusion or pneumothorax.  The cardiomediastinal silhouette is borderline normal in size. No acute osseous abnormalities are seen. Clips are noted at both axilla.  IMPRESSION: Mild vascular congestion, with increased interstitial markings, possibly reflecting minimal interstitial edema.   Electronically Signed   By: Garald Balding M.D.   On: 05/30/2014 00:33    Scheduled Meds: . apixaban  5 mg Oral BID  . atorvastatin  40 mg Oral Daily  . [START ON 05/31/2014] azithromycin  500 mg Intravenous Q24H  . [START ON 05/31/2014] cefTRIAXone (ROCEPHIN)  IV  1 g Intravenous Q24H  . donepezil  10 mg Oral QHS  . furosemide  60 mg Oral BID  . lisinopril  40 mg Oral Daily  . metoprolol succinate  50 mg Oral Daily  .  pantoprazole  40 mg Oral Daily  . sodium chloride  3 mL Intravenous Q12H   Continuous Infusions:     Time spent: 25 minutes    Alisse Tuite, [place]  [facility] Pager [phone]. If 7PM-7AM, please contact night-coverage at [email or web address], password [facility] 05/30/2014, 3:09 PM  LOS: 1 day

## 2014-05-30 NOTE — H&P (Signed)
PCP:   Purvis Kilts, MD   Chief Complaint:  Fever  HPI: 80 year old male who   has a past medical history of Arteriosclerotic cardiovascular disease (ASCVD); Hypertension; Hyperlipidemia; Cerebrovascular disease; Peripheral vascular disease; Tobacco abuse, in remission; COPD (chronic obstructive pulmonary disease); Fasting hyperglycemia; DDD (degenerative disc disease), cervical; Muscle discomfort; Hypercholesteremia; Lymphoma; Squamous cell carcinoma; Cancer; Diffuse large B cell lymphoma (03/18/2010); Dementia; CHF (congestive heart failure); and Atrial fibrillation. Patient was brought to the ED by his daughter, for chief complaint of fever, with shivering. As per daughter who provides most of the history, patient had gone to his girlfriend's house and they had gone out for dinner. When he returned from the dinner patient was very cold and started shivering. And when patient's daughter arrived there she found him in the blanket. Also patient was confused, did not know where the door was. And was disoriented. In the ED chest xray  showed pulmonary edema, UA shows mild hematuria, rectal temperature of 101.   Allergies:   Allergies  Allergen Reactions  . Nexium [Esomeprazole Magnesium]       Past Medical History  Diagnosis Date  . Arteriosclerotic cardiovascular disease (ASCVD)     BMS to RCA 1997; cutting ballon for RCA restenosis in 2001; cath 12/10 - 100% RCA L->R collaterals; nl EF; 40% LAD and OM1  . Hypertension   . Hyperlipidemia   . Cerebrovascular disease     bilateral bruits; duplex in 2009 - mild plaque w/o stenosis  . Peripheral vascular disease     ABI on 0.87 on left  . Tobacco abuse, in remission     60 pack years; discontinued in 2003  . COPD (chronic obstructive pulmonary disease)   . Fasting hyperglycemia   . DDD (degenerative disc disease), cervical   . Muscle discomfort     No improvement after statins temporary discontinued  . Hypercholesteremia     . Lymphoma     chemotherapy and radiation therapy in 2006; recurrence in 2007  . Squamous cell carcinoma     s/p Mohs surgery  . Cancer     b cell lymphoma  . 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 Dementia   . CHF (congestive heart failure)   . Atrial fibrillation     Past Surgical History  Procedure Laterality Date  . Anterior fusion cervical spine  2003    posterior?  . Inguinal hernia repair  1996    Right  . Orif femur fracture  1996  . Mohs surgery    . Colonoscopy  05/16/2011    Procedure: COLONOSCOPY;  Surgeon: Jamesetta So; diverticulosis, sigmoid polypectomy  . Port-a-cath removal Right 03/10/2013    Procedure: REMOVAL PORT-A-CATH;  Surgeon: Scherry Ran, MD;  Location: [facility];  Service: General;  Laterality: Right;  . Pyloric stenosis surgery      infant    Prior to Admission medications   Medication Sig Start Date End Date Taking? Authorizing Provider  apixaban (ELIQUIS) 5 MG TABS tablet Take 1 tablet (5 mg total) by mouth 2 (two) times daily. 03/27/14  Yes Arnoldo Lenis, MD  atorvastatin (LIPITOR) 40 MG tablet TAKE 1 TABLET BY MOUTH EVERY DAY 01/27/14  Yes Arnoldo Lenis, MD  donepezil (ARICEPT) 10 MG tablet Take 10 mg by mouth at bedtime.   Yes Historical Provider, MD  furosemide (LASIX) 40 MG tablet Take 1 tablet (40 mg total) by mouth 2 (two) times daily. 03/19/14  Yes Arnoldo Lenis,  MD  lisinopril (PRINIVIL,ZESTRIL) 40 MG tablet Take 1 tablet (40 mg total) by mouth daily. 03/19/14  Yes Arnoldo Lenis, MD  metoprolol succinate (TOPROL-XL) 50 MG 24 hr tablet Take 50 mg by mouth daily.   Yes Historical Provider, MD  omeprazole (PRILOSEC) 20 MG capsule Take 20 mg by mouth every morning.  02/11/13  Yes Historical Provider, MD  diphenhydrAMINE (BENADRYL) 25 mg capsule Take 25 mg by mouth every 6 (six) hours as needed.    Historical Provider, MD    Social History:  reports that he has quit smoking. He has never used smokeless tobacco. He reports  that he does not drink alcohol or use illicit drugs.  Family History  Problem Relation Age of Onset  . Stroke Father     deceased - 23  . Stroke Mother     deceased - 35   . Heart attack Brother 65  . Heart attack Sister   . Colon cancer Neg Hx      All the positives are listed in BOLD  Review of Systems:  HEENT: Headache, blurred vision, runny nose, sore throat Neck: Hypothyroidism, hyperthyroidism, non-Hodgkin lymphoma Chest : Shortness of breath, history of COPD, Asthma Heart : Chest pain, history of coronary arterey disease GI:  Nausea, vomiting, diarrhea, constipation, GERD GU: Dysuria, urgency, frequency of urination, hematuria Neuro: Stroke, seizures, syncope Psych: Depression, anxiety, hallucinations   Physical Exam: Blood pressure 121/80, pulse 95, temperature 101.7 F (38.7 C), temperature source Rectal, resp. rate 20, height 5\' 6"  (1.676 m), weight 68.04 kg (150 lb), SpO2 94 %. Constitutional:   Patient is a well-developed and well-nourished male* in no acute distress and cooperative with exam. Head: Normocephalic and atraumatic Mouth: Mucus membranes moist Eyes: PERRL, EOMI, conjunctivae normal Neck: Supple, No Thyromegaly Cardiovascular: RRR, S1 normal, S2 normal Pulmonary/Chest: Decreased breath sounds bilaterally Abdominal: Soft. Non-tender, non-distended, bowel sounds are normal, no masses, organomegaly, or guarding present.  Neurological: Alert but not oriented 3, Strength is normal and symmetric bilaterally, cranial nerve II-XII are grossly intact, no focal motor deficit, sensory intact to light touch bilaterally.  Extremities : No Cyanosis, Clubbing or Edema  Labs on Admission:  Basic Metabolic Panel:  Recent Labs Lab 05/30/14 0018  NA 139  K 3.7  CL 99  CO2 25  GLUCOSE 154*  BUN 8  CREATININE 1.01  CALCIUM 8.8   Liver Function Tests:  Recent Labs Lab 05/30/14 0018  AST 15  ALT 13  ALKPHOS 113  BILITOT 0.6  PROT 7.5  ALBUMIN 3.4*     No results for input(s): LIPASE, AMYLASE in the last 168 hours. No results for input(s): AMMONIA in the last 168 hours. CBC:  Recent Labs Lab 05/30/14 0018  WBC 8.3  NEUTROABS 7.0  HGB 12.2*  HCT 37.2*  MCV 98.2  PLT 230   Cardiac Enzymes: No results for input(s): CKTOTAL, CKMB, CKMBINDEX, TROPONINI in the last 168 hours.  BNP (last 3 results)  Recent Labs  09/05/13 1118  PROBNP 16211.0*   CBG: No results for input(s): GLUCAP in the last 168 hours.  Radiological Exams on Admission: Ct Head Wo Contrast  05/30/2014   CLINICAL DATA:  Worsening confusion. Altered mental status, acute onset. Shivering. Initial encounter.  EXAM: CT HEAD WITHOUT CONTRAST  TECHNIQUE: Contiguous axial images were obtained from the base of the skull through the vertex without intravenous contrast.  COMPARISON:  CT of the head from 04/22/2013, and MRI of the cervical spine performed 04/17/2013  FINDINGS: There  is no evidence of acute infarction, mass lesion, or intra- or extra-axial hemorrhage on CT.  Prominence of the ventricles and sulci reflects moderate cortical volume loss. Cerebellar atrophy is noted. Scattered periventricular white matter change likely reflects small vessel ischemic microangiopathy.  The brainstem and fourth ventricle are within normal limits. The basal ganglia are unremarkable in appearance. The cerebral hemispheres demonstrate grossly normal gray-white differentiation. No mass effect or midline shift is seen.  Hypertrophic changes along the posterior aspect of the odontoid process and near the tip of the clivus are grossly unchanged from 2014, with mild flattening of the upper cervical spinal cord as previously described.  There is no evidence of fracture; visualized osseous structures are unremarkable in appearance. The visualized portions of the orbits are within normal limits. The paranasal sinuses and mastoid air cells are well-aerated. No significant soft tissue abnormalities are  seen.  IMPRESSION: 1. No acute intracranial pathology seen on CT. 2. Moderate cortical volume loss and scattered small vessel ischemic microangiopathy. 3. Hypertrophic changes along the posterior aspect of the odontoid process and near the tip of the clivus are grossly unchanged from 2014, with mild flattening of the upper cervical spinal cord as previously described.   Electronically Signed   By: Garald Balding M.D.   On: 05/30/2014 00:59   Dg Chest Port 1 View  05/30/2014   CLINICAL DATA:  Acute onset of altered mental status, with shivering and weakness. Initial encounter.  EXAM: PORTABLE CHEST - 1 VIEW  COMPARISON:  Chest radiograph performed 09/05/2013  FINDINGS: The lungs are well-aerated. Mild vascular congestion is noted, with increased interstitial markings, possibly reflecting minimal interstitial edema. There is no evidence of pleural effusion or pneumothorax.  The cardiomediastinal silhouette is borderline normal in size. No acute osseous abnormalities are seen. Clips are noted at both axilla.  IMPRESSION: Mild vascular congestion, with increased interstitial markings, possibly reflecting minimal interstitial edema.   Electronically Signed   By: Garald Balding M.D.   On: 05/30/2014 00:33    EKG: Independently reviewed. Atrial fibrillation with nonspecific T-wave abnormalities   Assessment/Plan Principal Problem:   Fever Active Problems:   Diffuse large B cell lymphoma   Essential hypertension   Arteriosclerotic cardiovascular disease (ASCVD)   Pulmonary edema   A-fib   Chronic systolic CHF (congestive heart failure)   Atrial fibrillation with rapid ventricular response   Acute on chronic systolic CHF (congestive heart failure), NYHA class 3   Dementia   Pyrexia  Fever ? Cause, will admit to the hospital for presumed pneumonia. Will obtain urinary strep antigen as well as urine for Legionella. Continue Rocephin and Zithromax. Blood cultures, urine cultures will be obtained.  Lactic acid is 2.8.   Altered mental status Likely due to the above CT head negative Patient does have history of underlying dementia and has episodes of movements of confusion as per patient's daughter.  Atrial fibrillation Continue anticoagulation with Eliquis, metoprolol. Heart rate is well controlled.  Acute on chronic systolic CHF Patient chest shows pulmonary edema, he doesn't appear to be overtly short of breath. Continue Lasix at home dose of 40 mg twice a day. Follow BMP in a.m. Will check BNP.  History of CAD Patient has history of coronary artery disease, continue Toprol-XL. EKG shows A. fib with nonspecific T-wave abnormalities. Will cycle the Cardec enzymes the patient denies any chest pain at this time.  Hypertension Continue Zestril, metoprolol. BP is well controlled.  Dementia Stable, no behavior disturbance at this time Continue Aricept.  DVT prophylaxis Patient is on full dose and decannulation with apixaban.  Code status: Full code  Family discussion: Admission, patients condition and plan of care including tests being ordered have been discussed with the patient and her daughter at bedside* who indicate understanding and agree with the plan and Code Status.   Time Spent on Admission: 60 min  [facility] Pager: [phone] 05/30/2014, 3:32 AM  If 7PM-7AM, please contact night-coverage  [email or web address]  Password [email or web address]

## 2014-05-31 ENCOUNTER — Inpatient Hospital Stay (HOSPITAL_COMMUNITY): Payer: Medicare Other

## 2014-05-31 LAB — CBC
HCT: 37.6 % — ABNORMAL LOW (ref 39.0–52.0)
HEMOGLOBIN: 12.4 g/dL — AB (ref 13.0–17.0)
MCH: 32.3 pg (ref 26.0–34.0)
MCHC: 33 g/dL (ref 30.0–36.0)
MCV: 97.9 fL (ref 78.0–100.0)
Platelets: 234 10*3/uL (ref 150–400)
RBC: 3.84 MIL/uL — ABNORMAL LOW (ref 4.22–5.81)
RDW: 14.2 % (ref 11.5–15.5)
WBC: 5.8 10*3/uL (ref 4.0–10.5)

## 2014-05-31 LAB — COMPREHENSIVE METABOLIC PANEL
ALK PHOS: 93 U/L (ref 39–117)
ALT: 12 U/L (ref 0–53)
AST: 23 U/L (ref 0–37)
Albumin: 3 g/dL — ABNORMAL LOW (ref 3.5–5.2)
Anion gap: 16 — ABNORMAL HIGH (ref 5–15)
BUN: 13 mg/dL (ref 6–23)
CO2: 24 mEq/L (ref 19–32)
Calcium: 8.5 mg/dL (ref 8.4–10.5)
Chloride: 98 mEq/L (ref 96–112)
Creatinine, Ser: 0.95 mg/dL (ref 0.50–1.35)
GFR calc Af Amer: 88 mL/min — ABNORMAL LOW (ref >90)
GFR calc non Af Amer: 76 mL/min — ABNORMAL LOW (ref >90)
Glucose, Bld: 102 mg/dL — ABNORMAL HIGH (ref 70–99)
POTASSIUM: 3.6 meq/L — AB (ref 3.7–5.3)
SODIUM: 138 meq/L (ref 137–147)
TOTAL PROTEIN: 7.1 g/dL (ref 6.0–8.3)
Total Bilirubin: 0.6 mg/dL (ref 0.3–1.2)

## 2014-05-31 LAB — LACTIC ACID, PLASMA: Lactic Acid, Venous: 1.3 mmol/L (ref 0.5–2.2)

## 2014-05-31 MED ORDER — POTASSIUM CHLORIDE CRYS ER 20 MEQ PO TBCR
40.0000 meq | EXTENDED_RELEASE_TABLET | Freq: Once | ORAL | Status: AC
  Administered 2014-05-31: 40 meq via ORAL
  Filled 2014-05-31: qty 2

## 2014-05-31 MED ORDER — FUROSEMIDE 40 MG PO TABS
40.0000 mg | ORAL_TABLET | Freq: Two times a day (BID) | ORAL | Status: DC
  Administered 2014-05-31 – 2014-06-02 (×4): 40 mg via ORAL
  Filled 2014-05-31 (×4): qty 1

## 2014-05-31 NOTE — Progress Notes (Signed)
[facility] PROGRESS NOTE  Curtis Lang FAO:[id number] DOB: 11-Apr-1931 DOA: 05/29/2014 PCP: Purvis Kilts, MD   Brief narrative 80 year old male with history of coronary artery disease hypertension, hyperlipidemia, history of CVA, peripheral vascular disease, COPD, degenerative disc disease, history of B-cell lymphoma, dementia, CHF and A. fib brought to the hospital by his daughter for fever and chills. Patient was also found to be more confused than usual. In the ED patient was febrile to 101.52F, tachycardic with encephalopathy and elevated lactic acid meeting criteria for sepsis. chest x-ray showed pulmonary edema with negative UA for infection. Patient admitted for further workup.  Assessment/Plan: Sepsis with Unexplained fever   Differential includes possible pneumonia versus acute viral infection. Placed on empiric Rocephin and azithromycin. Follow blood cultures, urine cultures. Check urine strep antigen negative. Pending and Legionella antigen. Flu PCR negative. Repeat PA and lateral chest x-ray . -UA unremarkable.  -Mental status improving. -Lactic acid normalized. -Patient remains afebrile last 24 hours.  Acute encephalopathy Possibly secondary to sepsis. Patient does have underlying dementia. Head CT negative. UA unremarkable. Slowly improving.  Acute on chronic systolic CHF Chest x-ray showing pulmonary edema and has bibasilar crackles on exam. Patient does not appear short of breath on exam. Increased Lasix dose to 60 mg twice a day. Lung sounds are much clearer. Elevated pro BNP noted. 2-D echo from 2014 shows EF of 25 and 30%..  Continue lisinopril, metoprolol. monitor daily weight and strict I/O -  Atrial fibrillation Rate controlled. Continue Eliquis  Coronary artery disease Continue metoprolol and lisinopril. Continue statin. Serial troponins are negative.  History of B-cell lymphoma   Dementia Mild to moderate. Continue Aricept.  GERD Continue  PPI  Weakness and malnutrition PT and nutrition consult   Code Status: Full code Family Communication: None at bedside. Called  Daughter and left a message. Disposition Plan: Pending PT eval. Likely home tomorrow   Consultants:  None  Procedures:  Head CT  Antibiotics:  rocephin and azithro 12/19--  HPI/Subjective: Patient seen and examined. Denies any symptoms. Appears much more oriented today.  Objective: Filed Vitals:   05/31/14 0819  BP: 136/63  Pulse: 90  Temp: 97.6 F (36.4 C)  Resp: 15    Intake/Output Summary (Last 24 hours) at 05/31/14 6962 Last data filed at 05/31/14 0200  Gross per 24 hour  Intake    540 ml  Output    200 ml  Net    340 ml   Filed Weights   05/29/14 2354 05/30/14 0403  Weight: 68.04 kg (150 lb) 68.7 kg (151 lb 7.3 oz)    Exam:   General: Curtis Lang lying in bed in no acute distress  HEENT: Temporal wasting, moist oral mucosa  Chest: Bilaterally no rhonchi or wheeze  CVS: S1 and S2 irregular, no murmurs, rubs or gallop  Abdomen: Soft, nondistended, nontender,  Extremities: Warm, no edema  CNS: AAO 2, more oriented, nonfocal  Data Reviewed: Basic Metabolic Panel:  Recent Labs Lab 05/30/14 0018 05/31/14 0652  NA 139 138  K 3.7 3.6*  CL 99 98  CO2 25 24  GLUCOSE 154* 102*  BUN 8 13  CREATININE 1.01 0.95  CALCIUM 8.8 8.5   Liver Function Tests:  Recent Labs Lab 05/30/14 0018 05/31/14 0652  AST 15 23  ALT 13 12  ALKPHOS 113 93  BILITOT 0.6 0.6  PROT 7.5 7.1  ALBUMIN 3.4* 3.0*   No results for input(s): LIPASE, AMYLASE in the last 168 hours. No results for  input(s): AMMONIA in the last 168 hours. CBC:  Recent Labs Lab 05/30/14 0018 05/31/14 0652  WBC 8.3 5.8  NEUTROABS 7.0  --   HGB 12.2* 12.4*  HCT 37.2* 37.6*  MCV 98.2 97.9  PLT 230 234   Cardiac Enzymes:  Recent Labs Lab 05/30/14 0539 05/30/14 1023 05/30/14 1649  TROPONINI <0.30 <0.30 <0.30   BNP (last 3 results)  Recent  Labs  09/05/13 1118 05/30/14 0539  PROBNP 16211.0* 6684.0*   CBG: No results for input(s): GLUCAP in the last 168 hours.  Recent Results (from the past 240 hour(s))  Culture, blood (routine x 2)     Status: None (Preliminary result)   Collection Time: 05/30/14  5:33 AM  Result Value Ref Range Status   Specimen Description BLOOD LEFT ARM  Final   Special Requests BOTTLES DRAWN AEROBIC AND ANAEROBIC 8CC BOTTLES  Final   Culture NO GROWTH 1 DAY  Final   Report Status PENDING  Incomplete  Culture, blood (routine x 2)     Status: None (Preliminary result)   Collection Time: 05/30/14  5:44 AM  Result Value Ref Range Status   Specimen Description BLOOD LEFT ARM  Final   Special Requests BOTTLES DRAWN AEROBIC ONLY 6CC BOTTLE  Final   Culture NO GROWTH 1 DAY  Final   Report Status PENDING  Incomplete     Studies: Ct Head Wo Contrast  05/30/2014   CLINICAL DATA:  Worsening confusion. Altered mental status, acute onset. Shivering. Initial encounter.  EXAM: CT HEAD WITHOUT CONTRAST  TECHNIQUE: Contiguous axial images were obtained from the base of the skull through the vertex without intravenous contrast.  COMPARISON:  CT of the head from 04/22/2013, and MRI of the cervical spine performed 04/17/2013  FINDINGS: There is no evidence of acute infarction, mass lesion, or intra- or extra-axial hemorrhage on CT.  Prominence of the ventricles and sulci reflects moderate cortical volume loss. Cerebellar atrophy is noted. Scattered periventricular white matter change likely reflects small vessel ischemic microangiopathy.  The brainstem and fourth ventricle are within normal limits. The basal ganglia are unremarkable in appearance. The cerebral hemispheres demonstrate grossly normal gray-white differentiation. No mass effect or midline shift is seen.  Hypertrophic changes along the posterior aspect of the odontoid process and near the tip of the clivus are grossly unchanged from 2014, with mild flattening of  the upper cervical spinal cord as previously described.  There is no evidence of fracture; visualized osseous structures are unremarkable in appearance. The visualized portions of the orbits are within normal limits. The paranasal sinuses and mastoid air cells are well-aerated. No significant soft tissue abnormalities are seen.  IMPRESSION: 1. No acute intracranial pathology seen on CT. 2. Moderate cortical volume loss and scattered small vessel ischemic microangiopathy. 3. Hypertrophic changes along the posterior aspect of the odontoid process and near the tip of the clivus are grossly unchanged from 2014, with mild flattening of the upper cervical spinal cord as previously described.   Electronically Signed   By: Garald Balding M.D.   On: 05/30/2014 00:59   Dg Chest Port 1 View  05/30/2014   CLINICAL DATA:  Acute onset of altered mental status, with shivering and weakness. Initial encounter.  EXAM: PORTABLE CHEST - 1 VIEW  COMPARISON:  Chest radiograph performed 09/05/2013  FINDINGS: The lungs are well-aerated. Mild vascular congestion is noted, with increased interstitial markings, possibly reflecting minimal interstitial edema. There is no evidence of pleural effusion or pneumothorax.  The cardiomediastinal silhouette is  borderline normal in size. No acute osseous abnormalities are seen. Clips are noted at both axilla.  IMPRESSION: Mild vascular congestion, with increased interstitial markings, possibly reflecting minimal interstitial edema.   Electronically Signed   By: Garald Balding M.D.   On: 05/30/2014 00:33    Scheduled Meds: . apixaban  5 mg Oral BID  . atorvastatin  40 mg Oral Daily  . azithromycin  500 mg Intravenous Q24H  . cefTRIAXone (ROCEPHIN)  IV  1 g Intravenous Q24H  . donepezil  10 mg Oral QHS  . furosemide  60 mg Oral BID  . lisinopril  40 mg Oral Daily  . metoprolol succinate  50 mg Oral Daily  . pantoprazole  40 mg Oral Daily  . potassium chloride  40 mEq Oral Once  . sodium  chloride  3 mL Intravenous Q12H   Continuous Infusions:     Time spent: 25 minutes    Shakita Keir, [place]  [facility] Pager [phone]. If 7PM-7AM, please contact night-coverage at [email or web address], password [facility] 05/31/2014, 9:07 AM  LOS: 2 days

## 2014-06-01 DIAGNOSIS — G9340 Encephalopathy, unspecified: Secondary | ICD-10-CM

## 2014-06-01 LAB — URINE CULTURE
Colony Count: NO GROWTH
Culture: NO GROWTH

## 2014-06-01 MED ORDER — BOOST HIGH PROTEIN PO LIQD
1.0000 | Freq: Two times a day (BID) | ORAL | Status: DC
  Administered 2014-06-01: 1 via ORAL
  Administered 2014-06-01 – 2014-06-02 (×2): 237 mL via ORAL
  Filled 2014-06-01 (×7): qty 237

## 2014-06-01 MED ORDER — BOOST PLUS PO LIQD
237.0000 mL | Freq: Two times a day (BID) | ORAL | Status: DC
  Administered 2014-06-01: 237 mL via ORAL
  Filled 2014-06-01 (×3): qty 237

## 2014-06-01 MED ORDER — BOOST HIGH PROTEIN PO LIQD
1.0000 | Freq: Three times a day (TID) | ORAL | Status: DC
  Filled 2014-06-01 (×4): qty 237

## 2014-06-01 NOTE — Evaluation (Signed)
Physical Therapy Evaluation Patient Details Name: Curtis Lang MRN: [id number] DOB: 02/04/1931 Today's Date: 06/01/2014   History of Present Illness  80 year old male who   has a past medical history of Arteriosclerotic cardiovascular disease (ASCVD); Hypertension; Hyperlipidemia; Cerebrovascular disease; Peripheral vascular disease; Tobacco abuse, in remission; COPD (chronic obstructive pulmonary disease); Fasting hyperglycemia; DDD (degenerative disc disease), cervical; Muscle discomfort; Hypercholesteremia; Lymphoma; Squamous cell carcinoma; Cancer; Diffuse large B cell lymphoma (03/18/2010); Dementia; CHF (congestive heart failure); and Atrial fibrillation.  He was admitted with a fever and was found to have sepsis of unknown etiology.  He was, however, found to have a pleural effusion and thought to have pneumonia v.s. Viral infection.  Clinical Impression   Pt was seen for evaluation and found to be moderately confused.  His sitter was present during my visit and reports that pt's mobility at home is good-they ambulate 1 mile a day for exercise.  He was only mildly cooperative with eval.  He was found to be deconditioned from baseline with mild gait instability.  He refused to try to use a cane or walker to improve stability.  He had no overt loss of balance but preferred to use the handrail in the hallway when available.  I would recommend that pt be discharged to home.  Once in his own environment he should be more cooperative and we can have HHPT see him to assess gait as well as beginning him on a strengthening program.  If he needs any DME it can be ordered from that venue.    Follow Up Recommendations Home health PT    Equipment Recommendations  None recommended by PT (therapist at home will need to evaluate)    Recommendations for Other Services   none    Precautions / Restrictions Precautions Precautions: Fall Restrictions Weight Bearing Restrictions: No      Mobility  Bed Mobility Overal bed mobility: Modified Independent                Transfers Overall transfer level: Modified independent Equipment used: None             General transfer comment: pt holds onto wall upon standing  Ambulation/Gait Ambulation/Gait assistance: Supervision Ambulation Distance (Feet): 100 Feet Assistive device: None (pt holds onto railing whenever possible) Gait Pattern/deviations: Trunk flexed   Gait velocity interpretation: Below normal speed for age/gender General Gait Details: pt refused to try to use a cane or walker for gait...decreased balance is probably due to generalized weakness from recent illness  Stairs            Wheelchair Mobility    Modified Rankin (Stroke Patients Only)       Balance Overall balance assessment: No apparent balance deficits (not formally assessed)                                           Pertinent Vitals/Pain Pain Assessment: No/denies pain    Home Living Family/patient expects to be discharged to:: Private residence Living Arrangements: Children;Non-relatives/Friends Available Help at Discharge: Family;Personal care attendant;Available 24 hours/day Type of Home: House Home Access: Stairs to enter Entrance Stairs-Rails: Right Entrance Stairs-Number of Steps: 2 Home Layout: One level Home Equipment: None      Prior Function Level of Independence: Needs assistance   Gait / Transfers Assistance Needed: transfers and gait were independent  ADL's / Homemaking Assistance Needed: some assist  with personal grooming from sitter, full assist with household ADLs        Hand Dominance        Extremity/Trunk Assessment               Lower Extremity Assessment: Generalized weakness         Communication   Communication: No difficulties  Cognition Arousal/Alertness: Awake/alert Behavior During Therapy: WFL for tasks assessed/performed Overall Cognitive Status:  Impaired/Different from baseline Area of Impairment: Orientation Orientation Level: Place;Situation;Time             General Comments: pt thought he was at home...sitter thinks his confusion is possibly due to being in the hospital    General Comments      Exercises        Assessment/Plan    PT Assessment Patent does not need any further PT services  PT Diagnosis Difficulty walking;Generalized weakness   PT Problem List    PT Treatment Interventions     PT Goals (Current goals can be found in the Care Plan section) Acute Rehab PT Goals PT Goal Formulation: All assessment and education complete, DC therapy    Frequency     Barriers to discharge  none      Co-evaluation               End of Session Equipment Utilized During Treatment: Gait belt Activity Tolerance: Patient limited by fatigue Patient left: in bed;with call bell/phone within reach;with nursing/sitter in room           Time: 0931-1005 PT Time Calculation (min) (ACUTE ONLY): 34 min   Charges:   PT Evaluation $Initial PT Evaluation Tier I: 1 Procedure     PT G CodesDemetrios Isaacs L 06/01/2014, 10:17 AM

## 2014-06-01 NOTE — Progress Notes (Signed)
[facility] PROGRESS NOTE  Curtis Lang RJJ:[id number] DOB: Aug 26, 1930 DOA: 05/29/2014 PCP: Purvis Kilts, MD  Brief narrative 80 year old male with history of coronary artery disease hypertension, hyperlipidemia, history of CVA, peripheral vascular disease, COPD, degenerative disc disease, history of B-cell lymphoma, dementia, CHF and A. fib brought to the hospital by his daughter for fever and chills. Patient was also found to be more confused than usual. In the ED patient was febrile to 101.52F, tachycardic with encephalopathy and elevated lactic acid meeting criteria for sepsis. chest x-ray showed pulmonary edema with negative UA for infection. Patient admitted for further workup.  Assessment/Plan: Sepsis with Unexplained fever -possible pneumonia versus acute viral infection. Placed on empiric Rocephin and azithromycin. Culture so far negative. Flu PCR negative. Follow-up chest x-ray unremarkable -UA unremarkable.  -Mental status started to improve but again confused this morning. Also had MAXIMUM TEMPERATURE of 100.68F. Will monitor clinically on current antibiotics. -Lactic acid normalized. -repeat w/up if still febrile  Acute encephalopathy Secondary to sepsis. Has underlying dementia. Head CT on admission unremarkable. UA negative.  Acute on chronic systolic CHF Chest x-ray showing pulmonary edema and has bibasilar crackles on exam. Patient does not appear short of breath on exam. Increased Lasix dose to 60 mg twice a day. Elevated pro BNP noted. 2-D echo from 2014 shows EF of 25 and 30%..  Continue lisinopril, metoprolol. monitor daily weight and strict I/O -Reduce Lasix to home dose. -  Atrial fibrillation Rate controlled. Continue Eliquis  Coronary artery disease Continue metoprolol and lisinopril. Continue statin. Serial troponins are negative.  History of B-cell lymphoma   Dementia Mild to moderate at baseline. Worsened with underlying infection..  Continue Aricept.  GERD Continue PPI  Weakness and malnutrition PT and nutrition consult. PT recommends home with home health. Added nutrition supplements   Code Status: Full code Family Communication: Daughter updated on 12/20 Disposition Plan: Home with home health once medically stable.   Consultants:  None  Procedures:  Head CT  Antibiotics:  rocephin and azithro 12/19--  HPI/Subjective: Patient seen and examined. Appears more confused today.  Objective: Filed Vitals:   06/01/14 1507  BP: 118/75  Pulse: 102  Temp: 98.2 F (36.8 C)  Resp: 18    Intake/Output Summary (Last 24 hours) at 06/01/14 1649 Last data filed at 06/01/14 0100  Gross per 24 hour  Intake    540 ml  Output      0 ml  Net    540 ml   Filed Weights   05/29/14 2354 05/30/14 0403  Weight: 68.04 kg (150 lb) 68.7 kg (151 lb 7.3 oz)    Exam:   General:  Elderly male in no acute distress, appears confused  HEENT: Moist oral mucosa  Chest: Clear to auscultation bilaterally  CVS: S1 and S2 irregular, no murmurs rub or gallop  Abdomen: Soft, nondistended, nontender  Extremities: Warm, no edema  CNS: AAO x1, confused.   Data Reviewed: Basic Metabolic Panel:  Recent Labs Lab 05/30/14 0018 05/31/14 0652  NA 139 138  K 3.7 3.6*  CL 99 98  CO2 25 24  GLUCOSE 154* 102*  BUN 8 13  CREATININE 1.01 0.95  CALCIUM 8.8 8.5   Liver Function Tests:  Recent Labs Lab 05/30/14 0018 05/31/14 0652  AST 15 23  ALT 13 12  ALKPHOS 113 93  BILITOT 0.6 0.6  PROT 7.5 7.1  ALBUMIN 3.4* 3.0*   No results for input(s): LIPASE, AMYLASE in the last 168 hours. No results  for input(s): AMMONIA in the last 168 hours. CBC:  Recent Labs Lab 05/30/14 0018 05/31/14 0652  WBC 8.3 5.8  NEUTROABS 7.0  --   HGB 12.2* 12.4*  HCT 37.2* 37.6*  MCV 98.2 97.9  PLT 230 234   Cardiac Enzymes:  Recent Labs Lab 05/30/14 0539 05/30/14 1023 05/30/14 1649  TROPONINI <0.30 <0.30 <0.30    BNP (last 3 results)  Recent Labs  09/05/13 1118 05/30/14 0539  PROBNP 16211.0* 6684.0*   CBG: No results for input(s): GLUCAP in the last 168 hours.  Recent Results (from the past 240 hour(s))  Urine culture     Status: None   Collection Time: 05/30/14  4:40 AM  Result Value Ref Range Status   Specimen Description URINE, CLEAN CATCH  Final   Special Requests NONE  Final   Culture  Setup Time   Final    05/30/2014 23:49 Performed at [place] Performed at [facility]   Final   Culture NO GROWTH Performed at [facility]   Final   Report Status 06/01/2014 FINAL  Final  Culture, blood (routine x 2)     Status: None (Preliminary result)   Collection Time: 05/30/14  5:33 AM  Result Value Ref Range Status   Specimen Description BLOOD LEFT ARM  Final   Special Requests BOTTLES DRAWN AEROBIC AND ANAEROBIC 8CC BOTTLES  Final   Culture NO GROWTH 2 DAYS  Final   Report Status PENDING  Incomplete  Culture, blood (routine x 2)     Status: None (Preliminary result)   Collection Time: 05/30/14  5:44 AM  Result Value Ref Range Status   Specimen Description BLOOD LEFT ARM  Final   Special Requests BOTTLES DRAWN AEROBIC ONLY 6CC BOTTLE  Final   Culture NO GROWTH 2 DAYS  Final   Report Status PENDING  Incomplete     Studies: Dg Chest 2 View  05/31/2014   CLINICAL DATA:  Weakness, fevers, chills, increased confusion  EXAM: CHEST  2 VIEW  COMPARISON:  05/30/2014  FINDINGS: There is no focal parenchymal opacity, pleural effusion, or pneumothorax. The heart and mediastinal contours are unremarkable. There is thoracic aortic atherosclerosis.  There are surgical clips in bilateral axilla.  The osseous structures are unremarkable.  IMPRESSION: No active cardiopulmonary disease.   Electronically Signed   By: Kathreen Devoid   On: 05/31/2014 09:32    Scheduled Meds: . apixaban  5 mg Oral BID  . atorvastatin  40 mg Oral Daily  .  azithromycin  500 mg Intravenous Q24H  . cefTRIAXone (ROCEPHIN)  IV  1 g Intravenous Q24H  . donepezil  10 mg Oral QHS  . feeding supplement  1 Container Oral BID BM & HS  . furosemide  40 mg Oral BID  . lisinopril  40 mg Oral Daily  . metoprolol succinate  50 mg Oral Daily  . pantoprazole  40 mg Oral Daily  . sodium chloride  3 mL Intravenous Q12H   Continuous Infusions:     Time spent: 25 minutes    Curtis Lang, [place]  [facility] Pager [phone]. If 7PM-7AM, please contact night-coverage at [email or web address], password [facility] 06/01/2014, 4:49 PM  LOS: 3 days

## 2014-06-01 NOTE — Care Management Note (Unsigned)
    Page 1 of 1   06/02/2014     10:15:25 AM CARE MANAGEMENT NOTE 06/02/2014  Patient:  Curtis Lang, Curtis Lang   Account Number:  [id number]  Date Initiated:  06/01/2014  Documentation initiated by:  Theophilus Kinds  Subjective/Objective Assessment:   Pt admitted from home with fever and confusion. Pt lives with his daughter and will return home at discharge. Pt has a sitter with him during the day. Pt is fairly independent with ADL'.s     Action/Plan:   PT recommends HH PT. Will arrange with agency of choice. Pt running fever so discharge has been held.   Anticipated DC Date:  06/03/2014   Anticipated DC Plan:  [place]  CM consult      [facility] Choice  HOME HEALTH   Choice offered to / List presented to:  C-4 Adult Children        HH arranged  HH-2 PT      [place].   Status of service:  Completed, signed off Medicare Important Message given?  YES (If response is "NO", the following Medicare IM given date fields will be blank) Date Medicare IM given:  06/02/2014 Medicare IM given by:  Theophilus Kinds Date Additional Medicare IM given:   Additional Medicare IM given by:    Discharge Disposition:  [place]  Per UR Regulation:    If discussed at Long Length of Stay Meetings, dates discussed:    Comments:  06/02/14 [place], RN BSN CM Pt discharged home today with [facility] PT (per daughters choice). Romualdo Bolk of [facility] is aware and will collect the pts information from the chart. [facility] services to start within 48 hours of discharge. No DME needs noted. Pt and pts nurse aware of discharge arrangements.  06/01/14 [place], RN BSN CM

## 2014-06-01 NOTE — Progress Notes (Addendum)
INITIAL NUTRITION ASSESSMENT  DOCUMENTATION CODES Per approved criteria  -Not Applicable   INTERVENTION: -Boost Plus BID for additional nutrition support  NUTRITION DIAGNOSIS: Inadequate oral intake related to decreased appetite, lethargy as evidenced by diet hx, sitter's report.   Goal: Pt will meet >90% of estimated nutritional needs  Monitor:  PO/supplement intake, labs, weight changes, I/O's  Reason for Assessment: Consult to assess needs  80 y.o. 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  ASSESSMENT: RD received consult to assess nutrition needs. Hx obtained by pt's sitter at bedside. She reports that she has been with pt over that past 3-4 months and is very attentive to his needs.  She confirms that his weight has been stable over the past 3 months, although he does have a hx of weight loss. Wt hx reveals a 17# (10.1%) wt loss in the past year, however, he has maintained wt over the past 9 months.  Appetite has been good PTA. Sitter reports decline in appetite since hospitalization, which she attributes to his fever. She reports she is pushing fluids and denies any changes in chewing or swallowing function. Meal completion is 65-80%, however, she reports pt ate poorly at breakfast today.  Diet recall PTA reveals- Breakfast: 1/2 sausage and egg biscuit from [employer], Lunch: green beans, apples, and a can of Boost, Dinner: out to [employer] with daughter. Sitter reports that she has  been giving him Boost for the past 3 months and he takes it well.  Pt currently has no supplements ordered, but sitter requests Boost to provide continuity to home regimen. RD to order.  Nutrition focused physical exam deferred due to pt asleep at time of visit. Per sitter, pt is ambulatory PTA, but home health PT is to be ordered at discharge- she reports she noticed that pt hs become very weak.  Labs reviewed. K: 3.6, Glucose: 102.  Height: Ht Readings from Last 1 Encounters:  05/29/14 5\' 6"  (1.676 m)    Weight: Wt Readings from Last 1 Encounters:  05/30/14 151 lb 7.3 oz (68.7 kg)    Ideal Body Weight: 142#  % Ideal Body Weight: 106%  Wt Readings from Last 10 Encounters:  05/30/14 151 lb 7.3 oz (68.7 kg)  03/19/14 152 lb 1.9 oz (69.001 kg)  03/18/14 154 lb 4.8 oz (69.99 kg)  11/18/13 147 lb 8 oz (66.906 kg)  11/12/13 150 lb 6.4 oz (68.221 kg)  09/09/13 141 lb 8.6 oz (64.2 kg)  05/27/13 168 lb (76.204 kg)  05/17/13 162 lb 4.1 oz (73.6 kg)  04/22/13 165 lb (74.844 kg)  03/20/13 170 lb 9.6 oz (77.384 kg)    Usual Body Weight: 170#  % Usual Body Weight: 89%  BMI:  Body mass index is 24.46 kg/(m^2). Normal weight range  Estimated Nutritional Needs: Kcal: 1700-1900 Protein: 82-92 grams Fluid: 1.7-1.9 L  Skin: Intact with ecchymosis  Diet Order:  Heart Healthy  EDUCATION NEEDS: -No education needs identified at this time   Intake/Output Summary (Last 24 hours) at 06/01/14 0933 Last data filed at 06/01/14 0100  Gross per 24 hour  Intake    780 ml  Output  0 ml  Net    780 ml    Last BM: 05/31/14  Labs:   Recent Labs Lab 05/30/14 0018 05/31/14 0652  NA 139 138  K 3.7 3.6*  CL 99 98  CO2 25 24  BUN 8 13  CREATININE 1.01 0.95  CALCIUM 8.8 8.5  GLUCOSE 154* 102*    CBG (last 3)  No results for input(s): GLUCAP in the last 72 hours.  Scheduled Meds: . apixaban  5 mg Oral BID  . atorvastatin  40 mg Oral Daily  . azithromycin  500 mg  Intravenous Q24H  . cefTRIAXone (ROCEPHIN)  IV  1 g Intravenous Q24H  . donepezil  10 mg Oral QHS  . furosemide  40 mg Oral BID  . lisinopril  40 mg Oral Daily  . metoprolol succinate  50 mg Oral Daily  . pantoprazole  40 mg Oral Daily  . sodium chloride  3 mL Intravenous Q12H    Continuous Infusions:   Past Medical History  Diagnosis Date  . Arteriosclerotic cardiovascular disease (ASCVD)     BMS to RCA 1997; cutting ballon for RCA restenosis in 2001; cath 12/10 - 100% RCA L->R collaterals; nl EF; 40% LAD and OM1  . Hypertension   . Hyperlipidemia   . Cerebrovascular disease     bilateral bruits; duplex in 2009 - mild plaque w/o stenosis  . Peripheral vascular disease     ABI on 0.87 on left  . Tobacco abuse, in remission     60 pack years; discontinued in 2003  . COPD (chronic obstructive pulmonary disease)   . Fasting hyperglycemia   . DDD (degenerative disc disease), cervical   . Muscle discomfort     No improvement after statins temporary discontinued  . Hypercholesteremia   . Lymphoma     chemotherapy and radiation therapy in 2006; recurrence in 2007  . Squamous cell carcinoma     s/p Mohs surgery  . Cancer     b cell lymphoma  . 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 Dementia   . CHF (congestive heart failure)   . Atrial fibrillation     Past Surgical History  Procedure Laterality Date  . Anterior fusion cervical spine  2003    posterior?  . Inguinal hernia repair  1996    Right  . Orif femur fracture  1996  . Mohs surgery    . Colonoscopy  05/16/2011    Procedure: COLONOSCOPY;  Surgeon: Jamesetta So; diverticulosis, sigmoid polypectomy  . Port-a-cath removal Right 03/10/2013    Procedure: REMOVAL PORT-A-CATH;  Surgeon: Scherry Ran, MD;  Location: [facility];  Service: General;  Laterality: Right;  . Pyloric stenosis surgery      infant    Danni Shima A. Jimmye Norman, RD, LDN Pager: [phone]

## 2014-06-01 NOTE — Progress Notes (Signed)
UR chart review completed.  

## 2014-06-02 DIAGNOSIS — R410 Disorientation, unspecified: Secondary | ICD-10-CM

## 2014-06-02 LAB — LEGIONELLA ANTIGEN, URINE

## 2014-06-02 MED ORDER — BOOST HIGH PROTEIN PO LIQD: 1.0000 | Freq: Two times a day (BID) | ORAL | Status: DC

## 2014-06-02 MED ORDER — LEVOFLOXACIN 750 MG PO TABS: 750.0000 mg | ORAL_TABLET | Freq: Every day | ORAL | Status: AC

## 2014-06-02 NOTE — Discharge Summary (Signed)
Physician Discharge Summary  Curtis Lang [facility]:[id number] DOB: 25-Sep-1930 DOA: 05/29/2014  PCP: Purvis Kilts, MD  Admit date: 05/29/2014 Discharge date: 06/02/2014  Time spent: 35 minutes  Recommendations for Outpatient Follow-up:  1. D/c home with HHPT. Follow up with PCP in 1 week. Follow-up urine Legionella antigen as outpatient.   Discharge Diagnoses:  Principal Problem:   Sepsis  Active Problems:   Altered mental status   Diffuse large B cell lymphoma   Essential hypertension   Arteriosclerotic cardiovascular disease (ASCVD)   Pulmonary edema   A-fib   Chronic systolic CHF (congestive heart failure)   Acute on chronic systolic CHF (congestive heart failure), NYHA class 3   Dementia   Fever   Discharge Condition: fair  Diet recommendation: heart healthy with nutritional supplements  Filed Weights   05/29/14 2354 05/30/14 0403  Weight: 68.04 kg (150 lb) 68.7 kg (151 lb 7.3 oz)    History of present illness:  Please refer to admission H&P for details, but in brief, 80 year old male with history of coronary artery disease hypertension, hyperlipidemia, history of CVA, peripheral vascular disease, COPD, degenerative disc disease, history of B-cell lymphoma, dementia, CHF and A. fib brought to the hospital by his daughter for fever and chills. Patient was also found to be more confused than usual. In the ED patient was febrile to 101.10F, tachycardic with encephalopathy and elevated lactic acid meeting criteria for sepsis. chest x-ray showed pulmonary edema with negative UA for infection. Patient admitted for further workup.  Hospital Course:  Sepsis with Unexplained fever -possible acute viral infection versus pneumonia. Placed on empiric Rocephin and azithromycin. Culture so far negative. Flu PCR negative. Follow-up chest x-ray negative for any infiltrate. Urine for strep pneumonia antigen negative. Urine Legionella antigen is pending and should be followed up as  outpatient. -UA unremarkable. Elevated lactic acid normalized. -Patient afebrile for over 24 hours and mental status has returned to baseline. -Patient will be discharged on oral Levaquin to complete a 5 day course. Follow-up with PCP in one week.   Acute encephalopathy Secondary to sepsis. Has underlying dementia. Head CT on admission unremarkable. UA negative. To baseline.  Acute on chronic systolic CHF Chest x-ray showing pulmonary edema and has bibasilar crackles on exam. Patient does not appear short of breath on exam. Increased Lasix dose to 60 mg twice a day. Elevated pro BNP noted. 2-D echo from 2014 shows EF of 25 and 30%..  Continue lisinopril, metoprolol.  Improved clinically. -Reduce Lasix to home dose. -  Atrial fibrillation Rate controlled. Continue Eliquis  Coronary artery disease Continue metoprolol and lisinopril. Continue statin. Serial troponins are negative.  History of B-cell lymphoma   Dementia Mild to moderate at baseline. Worsened with underlying infection.. Continue Aricept.  GERD Continue PPI  Weakness and poor by mouth intake PT and nutrition consult. PT recommends home with home health. Added nutrition supplements   Code Status: Full code  Family Communication: Daughter updated on 12/20, caregiver at bedside  Disposition Plan: Home with home health PT   Consultants:  None  Procedures:  Head CT  Antibiotics:  rocephin and azithro 12/19--12/22  Oral levaquin 750 mg daily until 12/24   Discharge Exam: Filed Vitals:   06/02/14 0608  BP: 110/46  Pulse: 83  Temp: 98.5 F (36.9 C)  Resp: 18      Discharge Instructions  Gen.: Elderly male in no acute distress HEENT: Moist oral mucosa Chest: Clear to auscultation bilaterally CVS:  S1 and S2 irregular ,  no murmurs rub or gallop Abdomen: Soft, nondistended, nontender Extremities: Warm, no edema CNS: Alert and oriented X2 ( at baseline)    Current Discharge Medication  List    START taking these medications   Details  feeding supplement (BOOST HIGH PROTEIN) LIQD Take 237 mLs by mouth 3 (three) times daily - between meals and at bedtime. Qty: 60 Can, Refills: 0    levofloxacin (LEVAQUIN) 750 MG tablet Take 1 tablet (750 mg total) by mouth daily. Qty: 2 tablet, Refills: 0      CONTINUE these medications which have NOT CHANGED   Details  apixaban (ELIQUIS) 5 MG TABS tablet Take 1 tablet (5 mg total) by mouth 2 (two) times daily. Qty: 60 tablet, Refills: 6    atorvastatin (LIPITOR) 40 MG tablet Take 40 mg by mouth daily.    donepezil (ARICEPT) 10 MG tablet Take 10 mg by mouth at bedtime.    furosemide (LASIX) 40 MG tablet Take 1 tablet (40 mg total) by mouth 2 (two) times daily. Qty: 60 tablet, Refills: 6    lisinopril (PRINIVIL,ZESTRIL) 20 MG tablet Take 20 mg by mouth daily.     metoprolol succinate (TOPROL-XL) 100 MG 24 hr tablet Take 100 mg by mouth daily.    omeprazole (PRILOSEC) 20 MG capsule Take 20 mg by mouth daily.       No Known Allergies Follow-up Information    Follow up with Purvis Kilts, MD. Schedule an appointment as soon as possible for a visit in 1 week.   Specialty:  Family Medicine   Contact information:   [address] [place] [address] [phone]        The results of significant diagnostics from this hospitalization (including imaging, microbiology, ancillary and laboratory) are listed below for reference.    Significant Diagnostic Studies: Dg Chest 2 View  05/31/2014   CLINICAL DATA:  Weakness, fevers, chills, increased confusion  EXAM: CHEST  2 VIEW  COMPARISON:  05/30/2014  FINDINGS: There is no focal parenchymal opacity, pleural effusion, or pneumothorax. The heart and mediastinal contours are unremarkable. There is thoracic aortic atherosclerosis.  There are surgical clips in bilateral axilla.  The osseous structures are unremarkable.  IMPRESSION: No active cardiopulmonary disease.    Electronically Signed   By: Kathreen Devoid   On: 05/31/2014 09:32   Ct Head Wo Contrast  05/30/2014   CLINICAL DATA:  Worsening confusion. Altered mental status, acute onset. Shivering. Initial encounter.  EXAM: CT HEAD WITHOUT CONTRAST  TECHNIQUE: Contiguous axial images were obtained from the base of the skull through the vertex without intravenous contrast.  COMPARISON:  CT of the head from 04/22/2013, and MRI of the cervical spine performed 04/17/2013  FINDINGS: There is no evidence of acute infarction, mass lesion, or intra- or extra-axial hemorrhage on CT.  Prominence of the ventricles and sulci reflects moderate cortical volume loss. Cerebellar atrophy is noted. Scattered periventricular white matter change likely reflects small vessel ischemic microangiopathy.  The brainstem and fourth ventricle are within normal limits. The basal ganglia are unremarkable in appearance. The cerebral hemispheres demonstrate grossly normal gray-white differentiation. No mass effect or midline shift is seen.  Hypertrophic changes along the posterior aspect of the odontoid process and near the tip of the clivus are grossly unchanged from 2014, with mild flattening of the upper cervical spinal cord as previously described.  There is no evidence of fracture; visualized osseous structures are unremarkable in appearance. The visualized portions of the orbits are within normal limits.  The paranasal sinuses and mastoid air cells are well-aerated. No significant soft tissue abnormalities are seen.  IMPRESSION: 1. No acute intracranial pathology seen on CT. 2. Moderate cortical volume loss and scattered small vessel ischemic microangiopathy. 3. Hypertrophic changes along the posterior aspect of the odontoid process and near the tip of the clivus are grossly unchanged from 2014, with mild flattening of the upper cervical spinal cord as previously described.   Electronically Signed   By: Garald Balding M.D.   On: 05/30/2014 00:59   Dg  Chest Port 1 View  05/30/2014   CLINICAL DATA:  Acute onset of altered mental status, with shivering and weakness. Initial encounter.  EXAM: PORTABLE CHEST - 1 VIEW  COMPARISON:  Chest radiograph performed 09/05/2013  FINDINGS: The lungs are well-aerated. Mild vascular congestion is noted, with increased interstitial markings, possibly reflecting minimal interstitial edema. There is no evidence of pleural effusion or pneumothorax.  The cardiomediastinal silhouette is borderline normal in size. No acute osseous abnormalities are seen. Clips are noted at both axilla.  IMPRESSION: Mild vascular congestion, with increased interstitial markings, possibly reflecting minimal interstitial edema.   Electronically Signed   By: Garald Balding M.D.   On: 05/30/2014 00:33    Microbiology: Recent Results (from the past 240 hour(s))  Urine culture     Status: None   Collection Time: 05/30/14  4:40 AM  Result Value Ref Range Status   Specimen Description URINE, CLEAN CATCH  Final   Special Requests NONE  Final   Culture  Setup Time   Final    05/30/2014 23:49 Performed at [place] Performed at [facility]   Final   Culture NO GROWTH Performed at [facility]   Final   Report Status 06/01/2014 FINAL  Final  Culture, blood (routine x 2)     Status: None (Preliminary result)   Collection Time: 05/30/14  5:33 AM  Result Value Ref Range Status   Specimen Description BLOOD LEFT ARM  Final   Special Requests BOTTLES DRAWN AEROBIC AND ANAEROBIC 8CC BOTTLES  Final   Culture NO GROWTH 2 DAYS  Final   Report Status PENDING  Incomplete  Culture, blood (routine x 2)     Status: None (Preliminary result)   Collection Time: 05/30/14  5:44 AM  Result Value Ref Range Status   Specimen Description BLOOD LEFT ARM  Final   Special Requests BOTTLES DRAWN AEROBIC ONLY 6CC BOTTLE  Final   Culture NO GROWTH 2 DAYS  Final   Report Status PENDING  Incomplete      Labs: Basic Metabolic Panel:  Recent Labs Lab 05/30/14 0018 05/31/14 0652  NA 139 138  K 3.7 3.6*  CL 99 98  CO2 25 24  GLUCOSE 154* 102*  BUN 8 13  CREATININE 1.01 0.95  CALCIUM 8.8 8.5   Liver Function Tests:  Recent Labs Lab 05/30/14 0018 05/31/14 0652  AST 15 23  ALT 13 12  ALKPHOS 113 93  BILITOT 0.6 0.6  PROT 7.5 7.1  ALBUMIN 3.4* 3.0*   No results for input(s): LIPASE, AMYLASE in the last 168 hours. No results for input(s): AMMONIA in the last 168 hours. CBC:  Recent Labs Lab 05/30/14 0018 05/31/14 0652  WBC 8.3 5.8  NEUTROABS 7.0  --   HGB 12.2* 12.4*  HCT 37.2* 37.6*  MCV 98.2 97.9  PLT 230 234   Cardiac Enzymes:  Recent Labs Lab 05/30/14 0539 05/30/14  1023 05/30/14 1649  TROPONINI <0.30 <0.30 <0.30   BNP: BNP (last 3 results)  Recent Labs  09/05/13 1118 05/30/14 0539  PROBNP 16211.0* 6684.0*   CBG: No results for input(s): GLUCAP in the last 168 hours.     SignedLouellen Molder  [facility] 06/02/2014, 9:15 AM

## 2014-06-02 NOTE — Progress Notes (Signed)
Patient with orders to be discharged home. Discharge instructions given, patient, caregiver, and daughter verbalized understanding. Prescription given. Patient stable. Patient left in private vehicle with caregiver.

## 2014-06-02 NOTE — Care Management Utilization Note (Signed)
UR complete 

## 2014-06-03 ENCOUNTER — Emergency Department (HOSPITAL_COMMUNITY)
Admission: EM | Admit: 2014-06-03 | Discharge: 2014-06-03 | Disposition: A | Discharge status: Home / Self Care | Payer: Medicare Other | Attending: Emergency Medicine | Admitting: Emergency Medicine

## 2014-06-03 ENCOUNTER — Emergency Department (HOSPITAL_COMMUNITY): Payer: Medicare Other

## 2014-06-03 ENCOUNTER — Telehealth: Payer: Self-pay | Admitting: Internal Medicine

## 2014-06-03 ENCOUNTER — Encounter (HOSPITAL_COMMUNITY): Payer: Self-pay | Admitting: *Deleted

## 2014-06-03 DIAGNOSIS — Z8673 Personal history of transient ischemic attack (TIA), and cerebral infarction without residual deficits: Secondary | ICD-10-CM | POA: Insufficient documentation

## 2014-06-03 DIAGNOSIS — Z8739 Personal history of other diseases of the musculoskeletal system and connective tissue: Secondary | ICD-10-CM | POA: Insufficient documentation

## 2014-06-03 DIAGNOSIS — R1013 Epigastric pain: Secondary | ICD-10-CM | POA: Diagnosis not present

## 2014-06-03 DIAGNOSIS — I509 Heart failure, unspecified: Secondary | ICD-10-CM | POA: Insufficient documentation

## 2014-06-03 DIAGNOSIS — R109 Unspecified abdominal pain: Secondary | ICD-10-CM

## 2014-06-03 DIAGNOSIS — Z79899 Other long term (current) drug therapy: Secondary | ICD-10-CM | POA: Diagnosis not present

## 2014-06-03 DIAGNOSIS — Z792 Long term (current) use of antibiotics: Secondary | ICD-10-CM | POA: Insufficient documentation

## 2014-06-03 DIAGNOSIS — R112 Nausea with vomiting, unspecified: Secondary | ICD-10-CM | POA: Diagnosis not present

## 2014-06-03 DIAGNOSIS — Z87891 Personal history of nicotine dependence: Secondary | ICD-10-CM | POA: Insufficient documentation

## 2014-06-03 DIAGNOSIS — I10 Essential (primary) hypertension: Secondary | ICD-10-CM | POA: Insufficient documentation

## 2014-06-03 DIAGNOSIS — J449 Chronic obstructive pulmonary disease, unspecified: Secondary | ICD-10-CM | POA: Insufficient documentation

## 2014-06-03 DIAGNOSIS — Z85828 Personal history of other malignant neoplasm of skin: Secondary | ICD-10-CM | POA: Diagnosis not present

## 2014-06-03 DIAGNOSIS — Z8659 Personal history of other mental and behavioral disorders: Secondary | ICD-10-CM | POA: Diagnosis not present

## 2014-06-03 DIAGNOSIS — F0390 Unspecified dementia without behavioral disturbance: Secondary | ICD-10-CM | POA: Insufficient documentation

## 2014-06-03 DIAGNOSIS — E785 Hyperlipidemia, unspecified: Secondary | ICD-10-CM | POA: Diagnosis not present

## 2014-06-03 DIAGNOSIS — I2510 Atherosclerotic heart disease of native coronary artery without angina pectoris: Secondary | ICD-10-CM | POA: Diagnosis not present

## 2014-06-03 LAB — COMPREHENSIVE METABOLIC PANEL
ALT: 22 U/L (ref 0–53)
AST: 34 U/L (ref 0–37)
Albumin: 3.7 g/dL (ref 3.5–5.2)
Alkaline Phosphatase: 87 U/L (ref 39–117)
Anion gap: 11 (ref 5–15)
BUN: 34 mg/dL — ABNORMAL HIGH (ref 6–23)
CO2: 28 mmol/L (ref 19–32)
CREATININE: 1.52 mg/dL — AB (ref 0.50–1.35)
Calcium: 9.3 mg/dL (ref 8.4–10.5)
Chloride: 99 mEq/L (ref 96–112)
GFR calc Af Amer: 48 mL/min — ABNORMAL LOW (ref >90)
GFR, EST NON AFRICAN AMERICAN: 41 mL/min — AB (ref >90)
Glucose, Bld: 159 mg/dL — ABNORMAL HIGH (ref 70–99)
Potassium: 3.9 mmol/L (ref 3.5–5.1)
SODIUM: 138 mmol/L (ref 135–145)
TOTAL PROTEIN: 8 g/dL (ref 6.0–8.3)
Total Bilirubin: 0.6 mg/dL (ref 0.3–1.2)

## 2014-06-03 LAB — CBC WITH DIFFERENTIAL/PLATELET
BASOS PCT: 0 % (ref 0–1)
Basophils Absolute: 0 10*3/uL (ref 0.0–0.1)
EOS PCT: 2 % (ref 0–5)
Eosinophils Absolute: 0.1 10*3/uL (ref 0.0–0.7)
HCT: 40.5 % (ref 39.0–52.0)
Hemoglobin: 13.7 g/dL (ref 13.0–17.0)
LYMPHS PCT: 18 % (ref 12–46)
Lymphs Abs: 1.3 10*3/uL (ref 0.7–4.0)
MCH: 32.8 pg (ref 26.0–34.0)
MCHC: 33.8 g/dL (ref 30.0–36.0)
MCV: 96.9 fL (ref 78.0–100.0)
Monocytes Absolute: 0.7 10*3/uL (ref 0.1–1.0)
Monocytes Relative: 9 % (ref 3–12)
Neutro Abs: 5.2 10*3/uL (ref 1.7–7.7)
Neutrophils Relative %: 71 % (ref 43–77)
PLATELETS: 240 10*3/uL (ref 150–400)
RBC: 4.18 MIL/uL — ABNORMAL LOW (ref 4.22–5.81)
RDW: 14 % (ref 11.5–15.5)
WBC: 7.3 10*3/uL (ref 4.0–10.5)

## 2014-06-03 LAB — BASIC METABOLIC PANEL WITH GFR
Anion gap: 9 (ref 5–15)
BUN: 34 mg/dL — ABNORMAL HIGH (ref 6–23)
CO2: 26 mmol/L (ref 19–32)
Calcium: 8.8 mg/dL (ref 8.4–10.5)
Chloride: 102 meq/L (ref 96–112)
Creatinine, Ser: 1.45 mg/dL — ABNORMAL HIGH (ref 0.50–1.35)
GFR calc Af Amer: 51 mL/min — ABNORMAL LOW
GFR calc non Af Amer: 44 mL/min — ABNORMAL LOW
Glucose, Bld: 141 mg/dL — ABNORMAL HIGH (ref 70–99)
Potassium: 3.7 mmol/L (ref 3.5–5.1)
Sodium: 137 mmol/L (ref 135–145)

## 2014-06-03 LAB — URINALYSIS, ROUTINE W REFLEX MICROSCOPIC
Bilirubin Urine: NEGATIVE
Glucose, UA: NEGATIVE mg/dL
Hgb urine dipstick: NEGATIVE
Ketones, ur: NEGATIVE mg/dL
Leukocytes, UA: NEGATIVE
NITRITE: NEGATIVE
PH: 5.5 (ref 5.0–8.0)
Protein, ur: NEGATIVE mg/dL
SPECIFIC GRAVITY, URINE: 1.025 (ref 1.005–1.030)
UROBILINOGEN UA: 0.2 mg/dL (ref 0.0–1.0)

## 2014-06-03 LAB — I-STAT CG4 LACTIC ACID, ED: LACTIC ACID, VENOUS: 1.94 mmol/L (ref 0.5–2.2)

## 2014-06-03 LAB — TROPONIN I
TROPONIN I: 0.03 ng/mL (ref <0.031)
Troponin I: <0.03 ng/mL (ref <0.031)

## 2014-06-03 LAB — LIPASE, BLOOD: Lipase: 46 U/L (ref 11–59)

## 2014-06-03 LAB — BRAIN NATRIURETIC PEPTIDE: B Natriuretic Peptide: 287 pg/mL — ABNORMAL HIGH (ref 0.0–100.0)

## 2014-06-03 MED ORDER — SODIUM CHLORIDE 0.9 % IV BOLUS (SEPSIS)
500.0000 mL | Freq: Once | INTRAVENOUS | Status: AC
  Administered 2014-06-03: 500 mL via INTRAVENOUS

## 2014-06-03 MED ORDER — ONDANSETRON HCL 4 MG PO TABS
4.0000 mg | ORAL_TABLET | Freq: Four times a day (QID) | ORAL | Status: DC
Start: 2014-06-03 — End: 2014-07-15

## 2014-06-03 NOTE — ED Notes (Signed)
Pt was able to drink po fluids w/o any difficulties.

## 2014-06-03 NOTE — ED Provider Notes (Signed)
CSN: [id number]     Arrival date & time 06/03/14  1457 History  This chart was scribed for Curtis Essex, MD by Einar Pheasant, ED Scribe. This patient was seen in room APA07/[facility]07 and the patient's care was started at 4:41 PM.    Chief Complaint  Patient presents with  . Abdominal Pain   The history is provided by the patient. No language interpreter was used.   HPI Comments: Curtis Lang is a 80 y.o. male with a PMhx of Dementia presents to the Emergency Department complaining of gradual onset abdominal pain that started earlier today. Pt is also complaining of associated nausea and emesis. He endorses one episode of emesis. Pt was discharged yesterday for a viral infection. He was diagnosed with pneumonia and was discharged home with Levaquin (half pill in the morning and half a pill in the afternoon) Relative states that the pt threw up his second dose of Levaquin during lunch. She also endorses a fever 3 day ago of 100.9.  At the moment he denies any abdominal pain. Denies any fever, chills, SOB, chest pain, constipation, dizziness, light-headedness, leg swelling, or paraesthesia.    Past Medical History  Diagnosis Date  . Arteriosclerotic cardiovascular disease (ASCVD)     BMS to RCA 1997; cutting ballon for RCA restenosis in 2001; cath 12/10 - 100% RCA L->R collaterals; nl EF; 40% LAD and OM1  . Hypertension   . Hyperlipidemia   . Cerebrovascular disease     bilateral bruits; duplex in 2009 - mild plaque w/o stenosis  . Peripheral vascular disease     ABI on 0.87 on left  . Tobacco abuse, in remission     60 pack years; discontinued in 2003  . COPD (chronic obstructive pulmonary disease)   . Fasting hyperglycemia   . DDD (degenerative disc disease), cervical   . Muscle discomfort     No improvement after statins temporary discontinued  . Hypercholesteremia   . Lymphoma     chemotherapy and radiation therapy in 2006; recurrence in 2007  . Squamous cell carcinoma     s/p  Mohs surgery  . Cancer     b cell lymphoma  . 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 Dementia   . CHF (congestive heart failure)   . Atrial fibrillation    Past Surgical History  Procedure Laterality Date  . Anterior fusion cervical spine  2003    posterior?  . Inguinal hernia repair  1996    Right  . Orif femur fracture  1996  . Mohs surgery    . Colonoscopy  05/16/2011    Procedure: COLONOSCOPY;  Surgeon: Jamesetta So; diverticulosis, sigmoid polypectomy  . Port-a-cath removal Right 03/10/2013    Procedure: REMOVAL PORT-A-CATH;  Surgeon: Scherry Ran, MD;  Location: [facility];  Service: General;  Laterality: Right;  . Pyloric stenosis surgery      infant   Family History  Problem Relation Age of Onset  . Stroke Father     deceased - 2  . Stroke Mother     deceased - 21   . Heart attack Brother 50  . Heart attack Sister   . Colon cancer Neg Hx    History  Substance Use Topics  . Smoking status: Former Smoker -- 1.00 packs/day for 30 years  . Smokeless tobacco: Never Used  . Alcohol Use: No    Review of Systems A complete 10 system review of systems was obtained and all systems are  negative except as noted in the HPI and PMH.     Allergies  Levaquin  Home Medications   Prior to Admission medications   Medication Sig Start Date End Date Taking? Authorizing Provider  apixaban (ELIQUIS) 5 MG TABS tablet Take 1 tablet (5 mg total) by mouth 2 (two) times daily. 03/27/14  Yes Arnoldo Lenis, MD  atorvastatin (LIPITOR) 40 MG tablet Take 40 mg by mouth daily.   Yes Historical Provider, MD  donepezil (ARICEPT) 10 MG tablet Take 10 mg by mouth at bedtime.   Yes Historical Provider, MD  furosemide (LASIX) 40 MG tablet Take 1 tablet (40 mg total) by mouth 2 (two) times daily. 03/19/14  Yes Arnoldo Lenis, MD  levofloxacin (LEVAQUIN) 750 MG tablet Take 1 tablet (750 mg total) by mouth daily. 06/03/14 06/05/14 Yes Nishant Dhungel, MD  lisinopril  (PRINIVIL,ZESTRIL) 20 MG tablet Take 20 mg by mouth daily.  02/25/14  Yes Historical Provider, MD  metoprolol succinate (TOPROL-XL) 100 MG 24 hr tablet Take 50 mg by mouth every morning.  05/25/14  Yes Historical Provider, MD  omeprazole (PRILOSEC) 20 MG capsule Take 20 mg by mouth daily.   Yes Historical Provider, MD  feeding supplement (BOOST HIGH PROTEIN) LIQD Take 237 mLs by mouth 3 (three) times daily - between meals and at bedtime. 06/02/14   Nishant Dhungel, MD  ondansetron (ZOFRAN) 4 MG tablet Take 1 tablet (4 mg total) by mouth every 6 (six) hours. 06/03/14   Curtis Essex, MD   Triage Vitals: BP 147/62 mmHg  Pulse 115  Resp 34  Ht 5\' 10"  (1.778 m)  Wt 144 lb (65.318 kg)  BMI 20.66 kg/m2  SpO2 92%   Physical Exam  Constitutional: He is oriented to person, place, and time. He appears well-developed and well-nourished. No distress.  HENT:  Head: Normocephalic and atraumatic.  Mouth/Throat: Oropharynx is clear and moist. Mucous membranes are dry. No oropharyngeal exudate.  Eyes: Conjunctivae and EOM are normal. Pupils are equal, round, and reactive to light.  Neck: Normal range of motion. Neck supple.  No meningismus.  Cardiovascular: Regular rhythm, normal heart sounds and intact distal pulses.  Tachycardia present.   No murmur heard. Pulmonary/Chest: Effort normal and breath sounds normal. No respiratory distress.  Abdominal: Soft. There is tenderness in the epigastric area. There is no rebound and no guarding.  Genitourinary:  Rectal exam with no gross blood.   Musculoskeletal: Normal range of motion. He exhibits no edema or tenderness.  Neurological: He is alert and oriented to person, place, and time. No cranial nerve deficit. He exhibits normal muscle tone. Coordination normal.  No ataxia on finger to nose bilaterally. No pronator drift. 5/5 strength throughout. CN 2-12 intact. Negative Romberg. Equal grip strength. Sensation intact. Gait is normal.   Skin: Skin is warm.   Psychiatric: He has a normal mood and affect. His behavior is normal.  Nursing note and vitals reviewed.   ED Course  Procedures (including critical care time)  DIAGNOSTIC STUDIES: Oxygen Saturation is 92% on RA, low by my interpretation.    COORDINATION OF CARE: 4:47 PM- Pt advised of plan for treatment and pt agrees. 10:28 PM- pt was rechecked. He states that he is doing well and would like to go home.   Labs Review Labs Reviewed  CBC WITH DIFFERENTIAL - Abnormal; Notable for the following:    RBC 4.18 (*)    All other components within normal limits  COMPREHENSIVE METABOLIC PANEL - Abnormal; Notable for the  following:    Glucose, Bld 159 (*)    BUN 34 (*)    Creatinine, Ser 1.52 (*)    GFR calc non Af Amer 41 (*)    GFR calc Af Amer 48 (*)    All other components within normal limits  BRAIN NATRIURETIC PEPTIDE - Abnormal; Notable for the following:    B Natriuretic Peptide 287.0 (*)    All other components within normal limits  BASIC METABOLIC PANEL - Abnormal; Notable for the following:    Glucose, Bld 141 (*)    BUN 34 (*)    Creatinine, Ser 1.45 (*)    GFR calc non Af Amer 44 (*)    GFR calc Af Amer 51 (*)    All other components within normal limits  LIPASE, BLOOD  URINALYSIS, ROUTINE W REFLEX MICROSCOPIC  TROPONIN I  TROPONIN I  I-STAT CG4 LACTIC ACID, ED  POC OCCULT BLOOD, ED  I-STAT CG4 LACTIC ACID, ED    Imaging Review Dg Abd Acute W/chest  06/03/2014   CLINICAL DATA:  80 year old male with vomiting today. Recent hospitalization for pneumonia.  EXAM: ACUTE ABDOMEN SERIES (ABDOMEN 2 VIEW & CHEST 1 VIEW)  COMPARISON:  Chest x-ray 05/31/2014.  FINDINGS: Lung volumes are normal. No consolidative airspace disease. Mild scarring in the left lung base, similar to prior examinations. No pleural effusions. No pneumothorax. No pulmonary nodule or mass noted. Pulmonary vasculature and the cardiomediastinal silhouette are within normal limits. Atherosclerosis in the  thoracic aorta. Surgical clips in the left axillary region. Orthopedic fixation hardware in the lower cervical spine incompletely visualized. Multiple old healed left-sided rib fractures again noted.  IMPRESSION: 1. No radiographic evidence of acute cardiopulmonary disease. 2. Atherosclerosis. 3. Additional incidental findings, similar prior studies, as above.   Electronically Signed   By: Vinnie Langton M.D.   On: 06/03/2014 18:09     EKG Interpretation   Date/Time:  Wednesday June 03 2014 16:56:55 EST Ventricular Rate:  103 PR Interval:    QRS Duration: 130 QT Interval:  386 QTC Calculation: 505 R Axis:   -136 Text Interpretation:  Atrial fibrillation Anterolateral infarct, old  Prolonged QT interval Baseline wander in lead(s) III No significant change  was found Confirmed by Wyvonnia Dusky  MD, Averill Winters [phone]) on 06/03/2014 5:23:52  PM      MDM   Final diagnoses:  Abdominal pain  Nausea and vomiting, vomiting of unspecified type   Episode of vomiting after taking levaquin.  Discharged from hospital yesterday after admission for pneumonia. No chest pain, SOB, abdominal pain. Atrial fibrillation in 100s.  Creatinine 1.5 from baseline and 0.8 and 1.2 range. Patient given gentle IV hydration given EF of 30%.  Patient otherwise appears well. Labs otherwise unremarkable. No further vomiting. No abdominal pain. Discussed with Dr. Ernestina Patches. He recommends IV hydration in the ED.  After hydration, creatinine 1.4. Lactate normal.  Troponin negative x2.  Abdomen soft.  Tolerating PO.  No further vomiting in the ED.  Patient anxious to go home.  States he feels fine.  Suspect vomiting from medication intolerance.  He has one pill of levaquin left. zofran given.  Advised to take antibiotic with food.  Follow up with PCP. Return to the ED with new or worsening symptoms.  BP 120/75 mmHg  Pulse 92  Temp(Src) 97.5 F (36.4 C)  Resp 26  Ht 5\' 10"  (1.778 m)  Wt 144 lb (65.318 kg)  BMI 20.66  kg/m2  SpO2 100%     I personally  performed the services described in this documentation, which was scribed in my presence. The recorded information has been reviewed and is accurate.    Curtis Essex, MD 06/04/14 [phone]

## 2014-06-03 NOTE — ED Notes (Signed)
Pt just d/c'd yesterday for viral infection, pt with emesis and abd pain

## 2014-06-03 NOTE — ED Notes (Signed)
Dr. Wyvonnia Dusky aware that pt is attempting to urinate and that we may need to in-and-out cath to obtain sample. Pt is receiving fluids.

## 2014-06-03 NOTE — ED Notes (Signed)
Unable to get temperature.  

## 2014-06-03 NOTE — Discharge Instructions (Signed)
Nausea and Vomiting Your stomach was likely upset from the antibiotic.  Take the nausea medication as prescribed.  Return to the ED if you develop new or worsening symptoms. Nausea is a sick feeling that often comes before throwing up (vomiting). Vomiting is a reflex where stomach contents come out of your mouth. Vomiting can cause severe loss of body fluids (dehydration). Children and elderly adults can become dehydrated quickly, especially if they also have diarrhea. Nausea and vomiting are symptoms of a condition or disease. It is important to find the cause of your symptoms. CAUSES   Direct irritation of the stomach lining. This irritation can result from increased acid production (gastroesophageal reflux disease), infection, food poisoning, taking certain medicines (such as nonsteroidal anti-inflammatory drugs), alcohol use, or tobacco use.  Signals from the brain.These signals could be caused by a headache, heat exposure, an inner ear disturbance, increased pressure in the brain from injury, infection, a tumor, or a concussion, pain, emotional stimulus, or metabolic problems.  An obstruction in the gastrointestinal tract (bowel obstruction).  Illnesses such as diabetes, hepatitis, gallbladder problems, appendicitis, kidney problems, cancer, sepsis, atypical symptoms of a heart attack, or eating disorders.  Medical treatments such as chemotherapy and radiation.  Receiving medicine that makes you sleep (general anesthetic) during surgery. DIAGNOSIS Your caregiver may ask for tests to be done if the problems do not improve after a few days. Tests may also be done if symptoms are severe or if the reason for the nausea and vomiting is not clear. Tests may include:  Urine tests.  Blood tests.  Stool tests.  Cultures (to look for evidence of infection).  X-rays or other imaging studies. Test results can help your caregiver make decisions about treatment or the need for additional  tests. TREATMENT You need to stay well hydrated. Drink frequently but in small amounts.You may wish to drink water, sports drinks, clear broth, or eat frozen ice pops or gelatin dessert to help stay hydrated.When you eat, eating slowly may help prevent nausea.There are also some antinausea medicines that may help prevent nausea. HOME CARE INSTRUCTIONS   Take all medicine as directed by your caregiver.  If you do not have an appetite, do not force yourself to eat. However, you must continue to drink fluids.  If you have an appetite, eat a normal diet unless your caregiver tells you differently.  Eat a variety of complex carbohydrates (rice, wheat, potatoes, bread), lean meats, yogurt, fruits, and vegetables.  Avoid high-fat foods because they are more difficult to digest.  Drink enough water and fluids to keep your urine clear or pale yellow.  If you are dehydrated, ask your caregiver for specific rehydration instructions. Signs of dehydration may include:  Severe thirst.  Dry lips and mouth.  Dizziness.  Dark urine.  Decreasing urine frequency and amount.  Confusion.  Rapid breathing or pulse. SEEK IMMEDIATE MEDICAL CARE IF:   You have blood or brown flecks (like coffee grounds) in your vomit.  You have black or bloody stools.  You have a severe headache or stiff neck.  You are confused.  You have severe abdominal pain.  You have chest pain or trouble breathing.  You do not urinate at least once every 8 hours.  You develop cold or clammy skin.  You continue to vomit for longer than 24 to 48 hours.  You have a fever. MAKE SURE YOU:   Understand these instructions.  Will watch your condition.  Will get help right away if you  are not doing well or get worse. Document Released: 05/29/2005 Document Revised: 08/21/2011 Document Reviewed: 10/26/2010 [facility] Patient Information 2015 [place], [place]. This information is not intended to replace advice given to  you by your health care provider. Make sure you discuss any questions you have with your health care provider.

## 2014-06-03 NOTE — Telephone Encounter (Signed)
PATIENT DAUGHTER CALLED STATING FATHER IS IN THE ER BECAUSE HE WAS VOMITING EARLIER.  PATIENT WANTING AN UPPER ENDOSCOPY.  PATIENT DAUGHTER IS AWARE THAT THE ER DOCTOR WILL CALL THE ON CALL GASTRO PHYSICIAN IF NEEDED.

## 2014-06-04 LAB — POC OCCULT BLOOD, ED: Fecal Occult Bld: POSITIVE — AB

## 2014-06-04 LAB — CULTURE, BLOOD (ROUTINE X 2)
CULTURE: NO GROWTH
Culture: NO GROWTH

## 2014-06-22 ENCOUNTER — Ambulatory Visit: Payer: 59 | Admitting: Neurology

## 2014-07-10 ENCOUNTER — Encounter: Payer: Self-pay | Admitting: Neurology

## 2014-07-10 ENCOUNTER — Ambulatory Visit (INDEPENDENT_AMBULATORY_CARE_PROVIDER_SITE_OTHER): Payer: Medicare Other | Admitting: Neurology

## 2014-07-10 VITALS — BP 126/74 | HR 74 | Resp 16 | Ht 68.0 in | Wt 151.0 lb

## 2014-07-10 DIAGNOSIS — G309 Alzheimer's disease, unspecified: Secondary | ICD-10-CM

## 2014-07-10 DIAGNOSIS — F0151 Vascular dementia with behavioral disturbance: Secondary | ICD-10-CM

## 2014-07-10 MED ORDER — SERTRALINE HCL 25 MG PO TABS: 25.0000 mg | ORAL_TABLET | Freq: Every day | ORAL | Status: DC

## 2014-07-10 NOTE — Progress Notes (Addendum)
NEUROLOGY FOLLOW UP OFFICE NOTE  TAISON CELANI [id number]  HISTORY OF PRESENT ILLNESS: Hershell Brandl is an 81 year old right-handed man with history of CHF, atrial fibrillation (on Eliquis), arteriosclerotic cardiovascular disease, hypertension, hyperlipidemia, cerebrovascular disease, peripheral vascular disease, COPD, B cell lymphoma status post chemo and radiation in 2006 with recurrence in 2007, squamous cell carcinoma status post Mohs surgery who follows up for mixed Alzheimer's and vascular dementia.  Records, labs and head CT reviewed.  He is accompanied by his daughter and girlfriend who provide history.  UPDATE: Labs from 11/27/13 included B12 273, cholesterol 203, TG 134, HDL 40 and LDL 136.  Carotid doppler showed no hemodynamically significant ICA stenosis.  He is currently taking Aricept 10mg  daily.  He did not start Namenda due to cost.  His daughter says he is often irritable, however he is not combative.  He does talk loudly often.  His daughter is living with him in his house.  He is able to dress, bathe and fix a sandwich (not use stove), but his daughter administers his medications to him.  He usually sleeps well.  He denies depression.  Since last visit, he was admitted to the hospital in December for sepsis.  He had a fever but no source was discovered.  Head CT showed no acute abnormalities.  He was treated with antibiotics.  HISTORY: History is not entirely clear as his daughter just recently moved back to live with him after living away from home for the past 35 years. It appears that he has had a cognitive decline over the past one to one and a half years. It is not clear if initial symptoms were problems with memory. He would have some periods of confusion. Up until April, when his daughter moved back to live with him, he was living on his own. He began forgetting to take his medications or was confused on the proper way to take his medications. He began forgetting to  pay bills and forgot to file his taxes. He would sometimes forget names of acquaintances. He had some problems with remote memory as well. When talking about her past event, he will not remember what is daughter was talking about. He no longer drives but previously he did get lost while driving. He was not involved in any accidents or near accidents. He says he sleeps pretty well but his daughter can sometimes hear him walking around the house at night. He is able to perform all his activities of daily living such as bathing and dressing. He never used a cook for himself and often eats out. His appetite is pretty good overall. He has not exhibited any change in behavior or personality. However, he does seem a little more irritable and cusses more often. He did have an episode of hallucinations when he thought he saw a young man sitting in his house. Prior to moving back home, his daughter has noted that he stop sending Christmas and birthday cards approximately 3-4 years ago. Also, when talking to him on the phone, he appeared to lose his train of thought and forget what he was going to say midsentence. Do to Lasix, he does occasionally have urinary incontinence. He does have history of soft stool and has had bowel incontinence as well. When she moved back home, his house did not appear to be filthy, messy or in disarray. He does not have any episodes of sundowning or combativeness. He was recently admitted to the hospital for CHF exacerbation.  His father had history of strokes as well as confusion. His brother is may have had history of cognitive problems as well.  He currently takes Aricept 10mg  at bedtime.  Currently, he takes Aricept 5mg  daily.  04/22/13 CT HEAD WO:  diffuse atrophy and chronic small vessel ischemic changes. 11/18/13 [place] 6/30. PAST MEDICAL HISTORY: Past Medical History  Diagnosis Date  . Arteriosclerotic cardiovascular disease (ASCVD)     BMS to RCA 1997; cutting ballon for RCA  restenosis in 2001; cath 12/10 - 100% RCA L->R collaterals; nl EF; 40% LAD and OM1  . Hypertension   . Hyperlipidemia   . Cerebrovascular disease     bilateral bruits; duplex in 2009 - mild plaque w/o stenosis  . Peripheral vascular disease     ABI on 0.87 on left  . Tobacco abuse, in remission     60 pack years; discontinued in 2003  . COPD (chronic obstructive pulmonary disease)   . Fasting hyperglycemia   . DDD (degenerative disc disease), cervical   . Muscle discomfort     No improvement after statins temporary discontinued  . Hypercholesteremia   . Lymphoma     chemotherapy and radiation therapy in 2006; recurrence in 2007  . Squamous cell carcinoma     s/p Mohs surgery  . Cancer     b cell lymphoma  . 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 Dementia   . CHF (congestive heart failure)   . Atrial fibrillation     MEDICATIONS: Current Outpatient Prescriptions on File Prior to Visit  Medication Sig Dispense Refill  . apixaban (ELIQUIS) 5 MG TABS tablet Take 1 tablet (5 mg total) by mouth 2 (two) times daily. 60 tablet 6  . atorvastatin (LIPITOR) 40 MG tablet Take 40 mg by mouth daily.    Marland Kitchen donepezil (ARICEPT) 10 MG tablet Take 10 mg by mouth at bedtime.    . feeding supplement (BOOST HIGH PROTEIN) LIQD Take 237 mLs by mouth 3 (three) times daily - between meals and at bedtime. 60 Can 0  . furosemide (LASIX) 40 MG tablet Take 1 tablet (40 mg total) by mouth 2 (two) times daily. 60 tablet 6  . lisinopril (PRINIVIL,ZESTRIL) 20 MG tablet Take 20 mg by mouth daily.     . metoprolol succinate (TOPROL-XL) 100 MG 24 hr tablet Take 50 mg by mouth every morning.     Marland Kitchen omeprazole (PRILOSEC) 20 MG capsule Take 20 mg by mouth daily.    . ondansetron (ZOFRAN) 4 MG tablet Take 1 tablet (4 mg total) by mouth every 6 (six) hours. (Patient not taking: Reported on 07/10/2014) 12 tablet 0   No current facility-administered medications on file prior to visit.    ALLERGIES: Allergies    Allergen Reactions  . Levaquin [Levofloxacin] Nausea And Vomiting    FAMILY HISTORY: Family History  Problem Relation Age of Onset  . Stroke Father     deceased - 92  . Stroke Mother     deceased - 68   . Heart attack Brother 31  . Heart attack Sister   . Colon cancer Neg Hx     SOCIAL HISTORY: History   Social History  . Marital Status: Divorced    Spouse Name: N/A    Number of Children: N/A  . Years of Education: N/A   Occupational History  . Not on file.   Social History Main Topics  . Smoking status: Former Smoker -- 1.00 packs/day for 30 years  . Smokeless  tobacco: Never Used  . Alcohol Use: No  . Drug Use: No  . Sexual Activity: No   Other Topics Concern  . Not on file   Social History Narrative   Divorced; retired; does not get regular exercise.     REVIEW OF SYSTEMS: Constitutional: No fevers, chills, or sweats, no generalized fatigue, change in appetite Eyes: No visual changes, double vision, eye pain Ear, nose and throat: No hearing loss, ear pain, nasal congestion, sore throat Cardiovascular: No chest pain, palpitations Respiratory:  No shortness of breath at rest or with exertion, wheezes GastrointestinaI: No nausea, vomiting, diarrhea, abdominal pain, fecal incontinence Genitourinary:  No dysuria, urinary retention or frequency Musculoskeletal:  No neck pain, back pain Integumentary: No rash, pruritus, skin lesions Neurological: as above Psychiatric: No depression, insomnia, anxiety Endocrine: No palpitations, fatigue, diaphoresis, mood swings, change in appetite, change in weight, increased thirst Hematologic/Lymphatic:  No anemia, purpura, petechiae. Allergic/Immunologic: no itchy/runny eyes, nasal congestion, recent allergic reactions, rashes  PHYSICAL EXAM: Filed Vitals:   07/10/14 1351  BP: 126/74  Pulse: 74  Resp: 16   General: No acute distress Head:  Normocephalic/atraumatic Eyes:  Fundi not visualized on exam. Neck: supple,  no paraspinal tenderness, full range of motion Heart:  Regular rate and rhythm Lungs:  Clear to auscultation bilaterally Back: No paraspinal tenderness Neurological Exam: alert and oriented to person, place, and time. Attention span and concentration intact, recent and remote memory intact, fund of knowledge intact.  Speech fluent and not dysarthric, language intact.   Montreal Cognitive Assessment  07/10/2014  Visuospatial/ Executive (0/5) 1  Naming (0/3) 1  Attention: Read list of digits (0/2) 1  Attention: Read list of letters (0/1) 0  Attention: Serial 7 subtraction starting at 100 (0/3) 0  Language: Repeat phrase (0/2) 0  Language : Fluency (0/1) 0  Abstraction (0/2) 0  Delayed Recall (0/5) 0  Orientation (0/6) 1  Total 4  Adjusted Score (based on education) 5   CN II-XII intact. Fundi not visualized.  Bulk and tone normal, muscle strength 5/5 throughout.  Sensation to light touch, temperature and vibration intact.  Deep tendon reflexes 2+ throughout.  Finger to nose testing intact.  Gait normal.  IMPRESSION: Mixed Alzheimer's and vascular dementia with behavioral changes  PLAN: 1.  Will try zoloft 25mg  daily for mood and irritability 2.  Continue Aricept 10mg  daily 3.  Continue Eliquis 4.  Continue statin.  LDL was 136 in June.  Ideally should be less than 100 5.  Follow up in 6 months.  30 minutes spent with patient, over 50% spent discussing management for symptoms.  Metta Clines, DO  CC: Sharilyn Sites, MD

## 2014-07-10 NOTE — Patient Instructions (Signed)
1.  For irritability, will start sertraline 25mg  daily 2.  Continue Aricept 10mg  daily 3.  Follow up in 6 months.

## 2014-07-15 ENCOUNTER — Ambulatory Visit (INDEPENDENT_AMBULATORY_CARE_PROVIDER_SITE_OTHER): Payer: Medicare Other | Admitting: Cardiology

## 2014-07-15 ENCOUNTER — Encounter: Payer: Self-pay | Admitting: Cardiology

## 2014-07-15 VITALS — BP 150/66 | HR 83 | Ht 66.0 in | Wt 150.0 lb

## 2014-07-15 DIAGNOSIS — I5022 Chronic systolic (congestive) heart failure: Secondary | ICD-10-CM

## 2014-07-15 DIAGNOSIS — I4891 Unspecified atrial fibrillation: Secondary | ICD-10-CM

## 2014-07-15 DIAGNOSIS — F0390 Unspecified dementia without behavioral disturbance: Secondary | ICD-10-CM

## 2014-07-15 DIAGNOSIS — I2510 Atherosclerotic heart disease of native coronary artery without angina pectoris: Secondary | ICD-10-CM

## 2014-07-15 NOTE — Patient Instructions (Signed)

## 2014-07-15 NOTE — Progress Notes (Signed)
Clinical Summary Curtis Lang is a 81 y.o.male  1. CAD/Chronic systolic HF - prior PCI to RCA in 1997 with cutting balloon to RCA in 2001, last cath 05/2009 with chronically occluded RCA with left to right collaterals and moderate disease of LAD and LCX. LV gram at that time showed LVEF 55% with inferior hypokinesis.  - admitted 05/15/13 to [facility] with chest pain, though patient was very poor historian and the history of symptoms was unclear. There was no evidence of ACS by EKG or cardiac enzymes. Echo showed new finding of severe LV systolic dysfunction, LVEF 25-30%.  - he apparently was not able to tolerate Toprol XL at 100mg  due to low heart rates and fatigue while at [facility], decreased to 50mg  daily and has done well.  - denies any SOB. Denies any chest pain.  -compliant with meds. No lightheadness or dizziness.    2. Afib  - CHADS2Vasc score of 5, he remains on eliquis without any bleeding issues.  - denies any palpitations, on Toprol for rate control  3. Lymphoma  -He is in remission currently, he is followed by oncology  4. Dementia - followed by neurology  5. Hyperlipidemia - compliant with statin   Past Medical History  Diagnosis Date  . Arteriosclerotic cardiovascular disease (ASCVD)     BMS to RCA 1997; cutting ballon for RCA restenosis in 2001; cath 12/10 - 100% RCA L->R collaterals; nl EF; 40% LAD and OM1  . Hypertension   . Hyperlipidemia   . Cerebrovascular disease     bilateral bruits; duplex in 2009 - mild plaque w/o stenosis  . Peripheral vascular disease     ABI on 0.87 on left  . Tobacco abuse, in remission     60 pack years; discontinued in 2003  . COPD (chronic obstructive pulmonary disease)   . Fasting hyperglycemia   . DDD (degenerative disc disease), cervical   . Muscle discomfort     No improvement after statins temporary discontinued  . Hypercholesteremia   . Lymphoma     chemotherapy and radiation therapy in 2006; recurrence in  2007  . Squamous cell carcinoma     s/p Mohs surgery  . Cancer     b cell lymphoma  . 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 Dementia   . CHF (congestive heart failure)   . Atrial fibrillation      Allergies  Allergen Reactions  . Levaquin [Levofloxacin] Nausea And Vomiting     Current Outpatient Prescriptions  Medication Sig Dispense Refill  . apixaban (ELIQUIS) 5 MG TABS tablet Take 1 tablet (5 mg total) by mouth 2 (two) times daily. 60 tablet 6  . atorvastatin (LIPITOR) 40 MG tablet Take 40 mg by mouth daily.    Marland Kitchen donepezil (ARICEPT) 10 MG tablet Take 10 mg by mouth at bedtime.    . feeding supplement (BOOST HIGH PROTEIN) LIQD Take 237 mLs by mouth 3 (three) times daily - between meals and at bedtime. 60 Can 0  . furosemide (LASIX) 40 MG tablet Take 1 tablet (40 mg total) by mouth 2 (two) times daily. 60 tablet 6  . lisinopril (PRINIVIL,ZESTRIL) 20 MG tablet Take 20 mg by mouth daily.     Marland Kitchen lisinopril (PRINIVIL,ZESTRIL) 40 MG tablet     . metoprolol succinate (TOPROL-XL) 100 MG 24 hr tablet Take 50 mg by mouth every morning.     Marland Kitchen omeprazole (PRILOSEC) 20 MG capsule Take 20 mg by mouth daily.    Marland Kitchen  ondansetron (ZOFRAN) 4 MG tablet Take 1 tablet (4 mg total) by mouth every 6 (six) hours. (Patient not taking: Reported on 07/10/2014) 12 tablet 0  . sertraline (ZOLOFT) 25 MG tablet Take 1 tablet (25 mg total) by mouth daily. 30 tablet 5   No current facility-administered medications for this visit.     Past Surgical History  Procedure Laterality Date  . Anterior fusion cervical spine  2003    posterior?  . Inguinal hernia repair  1996    Right  . Orif femur fracture  1996  . Mohs surgery    . Colonoscopy  05/16/2011    Procedure: COLONOSCOPY;  Surgeon: Jamesetta So; diverticulosis, sigmoid polypectomy  . Port-a-cath removal Right 03/10/2013    Procedure: REMOVAL PORT-A-CATH;  Surgeon: Scherry Ran, MD;  Location: [facility];  Service: General;  Laterality: Right;  .  Pyloric stenosis surgery      infant     Allergies  Allergen Reactions  . Levaquin [Levofloxacin] Nausea And Vomiting      Family History  Problem Relation Age of Onset  . Stroke Father     deceased - 97  . Stroke Mother     deceased - 82   . Heart attack Brother 19  . Heart attack Sister   . Colon cancer Neg Hx      Social History Mr. Elmore reports that he has quit smoking. He has never used smokeless tobacco. Mr. Morocho reports that he does not drink alcohol.   Review of Systems CONSTITUTIONAL: No weight loss, fever, chills, weakness or fatigue.  HEENT: Eyes: No visual loss, blurred vision, double vision or yellow sclerae.No hearing loss, sneezing, congestion, runny nose or sore throat.  SKIN: No rash or itching.  CARDIOVASCULAR: per HPI RESPIRATORY: No shortness of breath, cough or sputum.  GASTROINTESTINAL: No anorexia, nausea, vomiting or diarrhea. No abdominal pain or blood.  GENITOURINARY: No burning on urination, no polyuria NEUROLOGICAL: No headache, dizziness, syncope, paralysis, ataxia, numbness or tingling in the extremities. No change in bowel or bladder control.  MUSCULOSKELETAL: No muscle, back pain, joint pain or stiffness.  LYMPHATICS: No enlarged nodes. No history of splenectomy.  PSYCHIATRIC: No history of depression or anxiety.  ENDOCRINOLOGIC: No reports of sweating, cold or heat intolerance. No polyuria or polydipsia.  Marland Kitchen   Physical Examination p 83 bp 150/66 Wt 150 lbs BMI 24 Gen: resting comfortably, no acute distress HEENT: no scleral icterus, pupils equal round and reactive, no palptable cervical adenopathy,  CV: RRR, 2/6 systolic murmur at apex, no JVD, no carotid bruits Resp: Clear to auscultation bilaterally GI: abdomen is soft, non-tender, non-distended, normal bowel sounds, no hepatosplenomegaly MSK: extremities are warm, no edema.  Skin: warm, no rash Neuro:  no focal deficits Psych: appropriate affect   Diagnostic  Studies 05/16/13 Echo  LVEF 25-30%, mild LVH, increased LA pressure, mild MR,mild RV dysfunction     Assessment and Plan  1. CAD/Systolic dysfunction  - prior history of CAD with interventions, recently found to have severe LV systolic dysfunction LVEF 25-30%. This is most likely secondary to ischemic cardiomyopathy  - unable to assess patient's symptoms as he is a very poor historian due to advanced dementia. We have not pursued aggressive invasive testing or ICD consideration due to his severe dementia and mulitple comorbidities, currently managing medically.  - did not tolerate high dose of Toprol XL. Continue current meds, consider aldactone if bp's remain stable.   2. Afib  - continue rate control, patient  with CHADS2Vascscore of 5, continue eliquis  3. Lymphoma  - continue to follow with onc.  4. Dementia - continue to follow with neuro    F/u 6 months   Arnoldo Lenis, M.D.

## 2014-07-21 ENCOUNTER — Other Ambulatory Visit: Payer: Self-pay | Admitting: Cardiology

## 2014-08-19 ENCOUNTER — Telehealth: Payer: Self-pay | Admitting: *Deleted

## 2014-08-19 MED ORDER — ATORVASTATIN CALCIUM 40 MG PO TABS: 40.0000 mg | ORAL_TABLET | Freq: Every day | ORAL | Status: DC

## 2014-08-19 MED ORDER — LISINOPRIL 40 MG PO TABS: 40.0000 mg | ORAL_TABLET | Freq: Every day | ORAL | Status: DC

## 2014-08-19 NOTE — Telephone Encounter (Signed)
Request for 90 day supply of atorvastatin 40 mg and lisinopril 40 mg. 90 day supply sent to [facility]

## 2014-08-20 ENCOUNTER — Ambulatory Visit (INDEPENDENT_AMBULATORY_CARE_PROVIDER_SITE_OTHER): Payer: Medicare Other | Admitting: Otolaryngology

## 2014-08-20 DIAGNOSIS — H903 Sensorineural hearing loss, bilateral: Secondary | ICD-10-CM | POA: Diagnosis not present

## 2014-08-20 DIAGNOSIS — H6121 Impacted cerumen, right ear: Secondary | ICD-10-CM | POA: Diagnosis not present

## 2014-11-13 ENCOUNTER — Emergency Department (HOSPITAL_COMMUNITY)
Admission: EM | Admit: 2014-11-13 | Discharge: 2014-11-14 | Disposition: A | Discharge status: Home / Self Care | Payer: Medicare Other | Attending: Emergency Medicine | Admitting: Emergency Medicine

## 2014-11-13 ENCOUNTER — Emergency Department (HOSPITAL_COMMUNITY): Payer: Medicare Other

## 2014-11-13 ENCOUNTER — Encounter (HOSPITAL_COMMUNITY): Payer: Self-pay | Admitting: Emergency Medicine

## 2014-11-13 DIAGNOSIS — Z79899 Other long term (current) drug therapy: Secondary | ICD-10-CM | POA: Insufficient documentation

## 2014-11-13 DIAGNOSIS — Z8739 Personal history of other diseases of the musculoskeletal system and connective tissue: Secondary | ICD-10-CM | POA: Diagnosis not present

## 2014-11-13 DIAGNOSIS — F0281 Dementia in other diseases classified elsewhere with behavioral disturbance: Secondary | ICD-10-CM | POA: Diagnosis not present

## 2014-11-13 DIAGNOSIS — R4689 Other symptoms and signs involving appearance and behavior: Secondary | ICD-10-CM

## 2014-11-13 DIAGNOSIS — F02818 Dementia in other diseases classified elsewhere, unspecified severity, with other behavioral disturbance: Secondary | ICD-10-CM | POA: Diagnosis present

## 2014-11-13 DIAGNOSIS — F919 Conduct disorder, unspecified: Secondary | ICD-10-CM | POA: Insufficient documentation

## 2014-11-13 DIAGNOSIS — Z8572 Personal history of non-Hodgkin lymphomas: Secondary | ICD-10-CM | POA: Diagnosis not present

## 2014-11-13 DIAGNOSIS — F0390 Unspecified dementia without behavioral disturbance: Secondary | ICD-10-CM | POA: Insufficient documentation

## 2014-11-13 DIAGNOSIS — E876 Hypokalemia: Secondary | ICD-10-CM | POA: Insufficient documentation

## 2014-11-13 DIAGNOSIS — I509 Heart failure, unspecified: Secondary | ICD-10-CM | POA: Diagnosis not present

## 2014-11-13 DIAGNOSIS — E785 Hyperlipidemia, unspecified: Secondary | ICD-10-CM | POA: Insufficient documentation

## 2014-11-13 DIAGNOSIS — J449 Chronic obstructive pulmonary disease, unspecified: Secondary | ICD-10-CM | POA: Diagnosis not present

## 2014-11-13 DIAGNOSIS — I10 Essential (primary) hypertension: Secondary | ICD-10-CM | POA: Insufficient documentation

## 2014-11-13 DIAGNOSIS — Z85828 Personal history of other malignant neoplasm of skin: Secondary | ICD-10-CM | POA: Insufficient documentation

## 2014-11-13 DIAGNOSIS — Z8673 Personal history of transient ischemic attack (TIA), and cerebral infarction without residual deficits: Secondary | ICD-10-CM | POA: Diagnosis not present

## 2014-11-13 DIAGNOSIS — Z87891 Personal history of nicotine dependence: Secondary | ICD-10-CM | POA: Insufficient documentation

## 2014-11-13 DIAGNOSIS — E780 Pure hypercholesterolemia: Secondary | ICD-10-CM | POA: Diagnosis not present

## 2014-11-13 DIAGNOSIS — Z7902 Long term (current) use of antithrombotics/antiplatelets: Secondary | ICD-10-CM | POA: Insufficient documentation

## 2014-11-13 DIAGNOSIS — Z7982 Long term (current) use of aspirin: Secondary | ICD-10-CM | POA: Diagnosis not present

## 2014-11-13 DIAGNOSIS — Z7951 Long term (current) use of inhaled steroids: Secondary | ICD-10-CM | POA: Diagnosis not present

## 2014-11-13 DIAGNOSIS — Z008 Encounter for other general examination: Secondary | ICD-10-CM | POA: Diagnosis present

## 2014-11-13 LAB — CBC WITH DIFFERENTIAL/PLATELET
BASOS PCT: 1 % (ref 0–1)
Basophils Absolute: 0.1 10*3/uL (ref 0.0–0.1)
Eosinophils Absolute: 0.4 10*3/uL (ref 0.0–0.7)
Eosinophils Relative: 4 % (ref 0–5)
HCT: 33.6 % — ABNORMAL LOW (ref 39.0–52.0)
Hemoglobin: 11.3 g/dL — ABNORMAL LOW (ref 13.0–17.0)
LYMPHS ABS: 2.1 10*3/uL (ref 0.7–4.0)
Lymphocytes Relative: 20 % (ref 12–46)
MCH: 32.1 pg (ref 26.0–34.0)
MCHC: 33.6 g/dL (ref 30.0–36.0)
MCV: 95.5 fL (ref 78.0–100.0)
Monocytes Absolute: 0.8 10*3/uL (ref 0.1–1.0)
Monocytes Relative: 8 % (ref 3–12)
Neutro Abs: 7.1 10*3/uL (ref 1.7–7.7)
Neutrophils Relative %: 67 % (ref 43–77)
Platelets: 361 10*3/uL (ref 150–400)
RBC: 3.52 MIL/uL — ABNORMAL LOW (ref 4.22–5.81)
RDW: 13.6 % (ref 11.5–15.5)
WBC: 10.4 10*3/uL (ref 4.0–10.5)

## 2014-11-13 LAB — RAPID URINE DRUG SCREEN, HOSP PERFORMED
AMPHETAMINES: NOT DETECTED
BARBITURATES: NOT DETECTED
Benzodiazepines: NOT DETECTED
Cocaine: NOT DETECTED
Opiates: NOT DETECTED
TETRAHYDROCANNABINOL: NOT DETECTED

## 2014-11-13 LAB — URINALYSIS, ROUTINE W REFLEX MICROSCOPIC
BILIRUBIN URINE: NEGATIVE
Glucose, UA: NEGATIVE mg/dL
KETONES UR: NEGATIVE mg/dL
Leukocytes, UA: NEGATIVE
NITRITE: NEGATIVE
PROTEIN: NEGATIVE mg/dL
SPECIFIC GRAVITY, URINE: 1.015 (ref 1.005–1.030)
Urobilinogen, UA: 0.2 mg/dL (ref 0.0–1.0)
pH: 6 (ref 5.0–8.0)

## 2014-11-13 LAB — BASIC METABOLIC PANEL
Anion gap: 12 (ref 5–15)
BUN: 9 mg/dL (ref 6–20)
CO2: 28 mmol/L (ref 22–32)
Calcium: 8.4 mg/dL — ABNORMAL LOW (ref 8.9–10.3)
Chloride: 98 mmol/L — ABNORMAL LOW (ref 101–111)
Creatinine, Ser: 0.95 mg/dL (ref 0.61–1.24)
GFR calc Af Amer: >60 mL/min (ref >60)
GFR calc non Af Amer: >60 mL/min (ref >60)
GLUCOSE: 135 mg/dL — AB (ref 65–99)
POTASSIUM: 2.8 mmol/L — AB (ref 3.5–5.1)
Sodium: 138 mmol/L (ref 135–145)

## 2014-11-13 LAB — ETHANOL: Alcohol, Ethyl (B): <5 mg/dL (ref <5)

## 2014-11-13 LAB — URINE MICROSCOPIC-ADD ON

## 2014-11-13 MED ORDER — POTASSIUM CHLORIDE CRYS ER 20 MEQ PO TBCR
60.0000 meq | EXTENDED_RELEASE_TABLET | Freq: Once | ORAL | Status: AC
  Administered 2014-11-14: 60 meq via ORAL
  Filled 2014-11-13: qty 3

## 2014-11-13 NOTE — ED Notes (Signed)
Patient brought to ED by family member. Patient does not know why he is here. Patient has no physical complaints at this time. Patient is alert and oriented to self, place, and family member. Family member states that patient has been verbally abusive recently and she states frustration with caring for patient. Family member states "bad days have been more frequent" patient does have a history of dementia.

## 2014-11-13 NOTE — ED Provider Notes (Signed)
CSN: [id number]     Arrival date & time 11/13/14  2011 History  This chart was scribed for Elnora Morrison, MD by Peyton Bottoms, ED Scribe. This patient was seen in room APA16A/AP[facility]16A and the patient's care was started at 8:43 PM.   Chief Complaint  Patient presents with  . Medical Clearance   The history is provided by the patient and a relative. No language interpreter was used.   HPI Comments: Curtis Lang is a 81 y.o. male with a PMHx of hypertension, hyperlipidemia, CVD, COPD, DDD, muscle discomfort, hypercholesteremia, dementia, CHF, Lymphoma, who presents to the Emergency Department brought in by daughter requesting medical clearance. Per daughter, patient has been behaving abnormally with verbal abuse for the past 2 weeks. Per daughter, patient has had associated increased episodes of confusion. Per daughter, patient has trouble taking enough fluids in association to his heart problems. Patient has multiple family relationship issues that eventually caused daughter to bring patient into ED. Daughter states that she is "fed up and doesn't deserve to be treated this way", and wants patient to be seen for medical clearance.  Past Medical History  Diagnosis Date  . Arteriosclerotic cardiovascular disease (ASCVD)     BMS to RCA 1997; cutting ballon for RCA restenosis in 2001; cath 12/10 - 100% RCA L->R collaterals; nl EF; 40% LAD and OM1  . Hypertension   . Hyperlipidemia   . Cerebrovascular disease     bilateral bruits; duplex in 2009 - mild plaque w/o stenosis  . Peripheral vascular disease     ABI on 0.87 on left  . Tobacco abuse, in remission     60 pack years; discontinued in 2003  . COPD (chronic obstructive pulmonary disease)   . Fasting hyperglycemia   . DDD (degenerative disc disease), cervical   . Muscle discomfort     No improvement after statins temporary discontinued  . Hypercholesteremia   . Lymphoma     chemotherapy and radiation therapy in 2006; recurrence in 2007   . Squamous cell carcinoma     s/p Mohs surgery  . Cancer     b cell lymphoma  . 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 Dementia   . CHF (congestive heart failure)   . Atrial fibrillation    Past Surgical History  Procedure Laterality Date  . Anterior fusion cervical spine  2003    posterior?  . Inguinal hernia repair  1996    Right  . Orif femur fracture  1996  . Mohs surgery    . Colonoscopy  05/16/2011    Procedure: COLONOSCOPY;  Surgeon: Jamesetta So; diverticulosis, sigmoid polypectomy  . Port-a-cath removal Right 03/10/2013    Procedure: REMOVAL PORT-A-CATH;  Surgeon: Scherry Ran, MD;  Location: [facility];  Service: General;  Laterality: Right;  . Pyloric stenosis surgery      infant   Family History  Problem Relation Age of Onset  . Stroke Father     deceased - 4  . Stroke Mother     deceased - 23   . Heart attack Brother 60  . Heart attack Sister   . Colon cancer Neg Hx    History  Substance Use Topics  . Smoking status: Former Smoker -- 1.00 packs/day for 50 years    Types: Cigarettes    Start date: 09/19/1945    Quit date: 06/12/1996  . Smokeless tobacco: Never Used  . Alcohol Use: No   Review of Systems  All other systems  reviewed and are negative.  Allergies  Levaquin  Home Medications   Prior to Admission medications   Medication Sig Start Date End Date Taking? Authorizing Provider  acetaminophen (TYLENOL) 325 MG tablet Take 650 mg by mouth daily as needed for mild pain or moderate pain.   Yes Historical Provider, MD  apixaban (ELIQUIS) 5 MG TABS tablet Take 1 tablet (5 mg total) by mouth 2 (two) times daily. 03/27/14  Yes Arnoldo Lenis, MD  aspirin 325 MG tablet Take 325 mg by mouth daily as needed for mild pain or moderate pain.   Yes Historical Provider, MD  atorvastatin (LIPITOR) 40 MG tablet TAKE 1 TABLET BY MOUTH EVERY DAY 07/21/14  Yes Arnoldo Lenis, MD  donepezil (ARICEPT) 10 MG tablet Take 10 mg by mouth at bedtime.   Yes  Historical Provider, MD  ENSURE (ENSURE) Take 237 mLs by mouth daily as needed (FOR NUTRITIONAL SUPPLEMENT).   Yes Historical Provider, MD  fluticasone (FLONASE) 50 MCG/ACT nasal spray Place 1 spray into both nostrils daily as needed. 09/22/14  Yes Historical Provider, MD  furosemide (LASIX) 40 MG tablet Take 1 tablet (40 mg total) by mouth 2 (two) times daily. 03/19/14  Yes Arnoldo Lenis, MD  lisinopril (PRINIVIL,ZESTRIL) 40 MG tablet Take 1 tablet (40 mg total) by mouth daily. 08/19/14  Yes Arnoldo Lenis, MD  metoprolol succinate (TOPROL-XL) 100 MG 24 hr tablet Take 50 mg by mouth every morning.  05/25/14  Yes Historical Provider, MD  sertraline (ZOLOFT) 25 MG tablet Take 1 tablet (25 mg total) by mouth daily. 07/10/14  Yes Adam Telford Nab, DO  sodium chloride (OCEAN) 0.65 % SOLN nasal spray Place 1 spray into both nostrils as needed for congestion.   Yes Historical Provider, MD  feeding supplement (BOOST HIGH PROTEIN) LIQD Take 237 mLs by mouth 3 (three) times daily - between meals and at bedtime. Patient not taking: Reported on 11/13/2014 06/02/14   Nishant Dhungel, MD   Triage Vitals: BP 159/91 mmHg  Pulse 89  Temp(Src) 97.9 F (36.6 C) (Oral)  Resp 20  Ht 5\' 6"  (1.676 m)  Wt 165 lb (74.844 kg)  BMI 26.64 kg/m2  SpO2 100%  Physical Exam  Constitutional: He is oriented to person, place, and time. He appears well-developed and well-nourished. No distress.  HENT:  Head: Normocephalic and atraumatic.  Eyes: Conjunctivae and EOM are normal. Pupils are equal, round, and reactive to light.  Neck: Neck supple. No tracheal deviation present.  Cardiovascular: Normal rate, regular rhythm and normal heart sounds.   Pulmonary/Chest: Effort normal and breath sounds normal. No respiratory distress.  Abdominal: Soft. There is no tenderness.  Musculoskeletal: Normal range of motion.  No arm drift. No leg drift. No obvious deformities in extremities.  Neurological: He is alert and oriented to  person, place, and time.  Strength and sensation normal. 5+ strength in UE and LE with f/e at major joints. Sensation to palpation intact in UE and LE. CNs 2-12 grossly intact.  EOMFI.  PERRL.   Finger nose and coordination intact bilateral.   Visual fields intact to finger testing.   Skin: Skin is warm and dry.  Psychiatric: His behavior is normal.  Mild agitated. Clinically mild dementia.  Nursing note and vitals reviewed.  ED Course  Procedures (including critical care time)  DIAGNOSTIC STUDIES: Oxygen Saturation is 100% on RA, normal by my interpretation.    COORDINATION OF CARE: 8:54 PM- Discussed plans to order diagnostic CT imaging of head,  lab work and urinalysis. Pt advised of plan for treatment and pt agrees.  Labs Review Labs Reviewed  BASIC METABOLIC PANEL - Abnormal; Notable for the following:    Potassium 2.8 (*)    Chloride 98 (*)    Glucose, Bld 135 (*)    Calcium 8.4 (*)    All other components within normal limits  CBC WITH DIFFERENTIAL/PLATELET - Abnormal; Notable for the following:    RBC 3.52 (*)    Hemoglobin 11.3 (*)    HCT 33.6 (*)    All other components within normal limits  URINALYSIS, ROUTINE W REFLEX MICROSCOPIC (NOT AT [facility]) - Abnormal; Notable for the following:    Hgb urine dipstick TRACE (*)    All other components within normal limits  URINE RAPID DRUG SCREEN (HOSP PERFORMED) NOT AT [facility]  ETHANOL  URINE MICROSCOPIC-ADD ON    Imaging Review Ct Head Wo Contrast  11/13/2014   CLINICAL DATA:  Initial evaluation for 2 week history of behavioral change. Confusion.  EXAM: CT HEAD WITHOUT CONTRAST  TECHNIQUE: Contiguous axial images were obtained from the base of the skull through the vertex without intravenous contrast.  COMPARISON:  Prior study from 05/30/2014  FINDINGS: Moderate generalized cerebral atrophy with chronic small vessel ischemic type changes present. Atheromatous plaque present within the carotid siphons bilaterally.  No acute  large vessel territory infarct or intracranial hemorrhage. No mass lesion or midline shift. Ventricular prominence related global parenchymal volume loss present without hydrocephalus. No extra-axial fluid collection.  Scalp soft tissues within normal limits. No acute abnormality about the orbits.  Air-fluid levels present within the maxillary sinuses and sphenoid sinuses. Visualized paranasal sinuses are otherwise clear. No mastoid effusion.  Calvarium intact.  IMPRESSION: 1. No acute intracranial process. 2. Moderate cerebral atrophy with chronic microvascular ischemic disease. 3. Air-fluid levels within the bilateral maxillary and sphenoid sinuses, suggestive of acute sinusitis. This may be either infectious or inflammatory nature.   Electronically Signed   By: Jeannine Boga M.D.   On: 11/13/2014 23:15     EKG Interpretation None     MDM   Final diagnoses:  Behavioral change  Hypokalemia  I personally performed the services described in this documentation, which was scribed in my presence. The recorded information has been reviewed and is accurate.  Patient presents with worsening behavior changes and more frequent. Patient has been verbally abusive to family members. Nonfocal neuro exam, mild confusion/baseline dementia. Patient is on a blood thinner. Plan for CT head to look for subdural, urinalysis and basic blood work and if unremarkable plan for TTS consult to assist family and patient.  CT head results reviewed no acute findings. Blood work low K,  po and urine unremarkable. On recheck patient cooperating, pleasant. Plan for behavioral health to help with treatment plan.  Daughter with the patient in ED.  Filed Vitals:   11/13/14 2116 11/13/14 2202 11/13/14 2244 11/13/14 2329  BP: 134/78 152/82 160/81 175/71  Pulse: 73 91 71 78  Temp:      TempSrc:      Resp: 18 18 18 15   Height:      Weight:      SpO2: 100% 98% 100% 100%      Elnora Morrison, MD 11/14/14 [phone]

## 2014-11-13 NOTE — ED Notes (Addendum)
Daughter reports patient has been acting strange. Making threats to family members, cursing and being verbally abusive to family members. When this nurse asked the patient why he is in the ED tonight, patient states "I don't know, ask this person I'm with." Daughter reports patient folded laundry earlier today and then was asking who folded it, states patient has appeared confused. Daughter reports patient does have history of dementia.

## 2014-11-14 DIAGNOSIS — F02818 Dementia in other diseases classified elsewhere, unspecified severity, with other behavioral disturbance: Secondary | ICD-10-CM | POA: Diagnosis present

## 2014-11-14 DIAGNOSIS — F919 Conduct disorder, unspecified: Secondary | ICD-10-CM | POA: Diagnosis not present

## 2014-11-14 DIAGNOSIS — F0281 Dementia in other diseases classified elsewhere with behavioral disturbance: Secondary | ICD-10-CM | POA: Diagnosis not present

## 2014-11-14 MED ORDER — LISINOPRIL 10 MG PO TABS
40.0000 mg | ORAL_TABLET | Freq: Every day | ORAL | Status: DC
  Administered 2014-11-14: 40 mg via ORAL
  Filled 2014-11-14: qty 4

## 2014-11-14 MED ORDER — APIXABAN 5 MG PO TABS
ORAL_TABLET | ORAL | Status: AC
  Filled 2014-11-14: qty 1

## 2014-11-14 MED ORDER — FLUTICASONE PROPIONATE 50 MCG/ACT NA SUSP
1.0000 | Freq: Every day | NASAL | Status: DC
  Administered 2014-11-14: 1 via NASAL
  Filled 2014-11-14: qty 16

## 2014-11-14 MED ORDER — METOPROLOL SUCCINATE ER 50 MG PO TB24
50.0000 mg | ORAL_TABLET | Freq: Every morning | ORAL | Status: DC
  Administered 2014-11-14: 50 mg via ORAL
  Filled 2014-11-14 (×2): qty 1

## 2014-11-14 MED ORDER — ENSURE ENLIVE PO LIQD
237.0000 mL | Freq: Every day | ORAL | Status: DC | PRN
  Filled 2014-11-14: qty 237

## 2014-11-14 MED ORDER — SERTRALINE HCL 50 MG PO TABS
25.0000 mg | ORAL_TABLET | Freq: Every day | ORAL | Status: DC
  Administered 2014-11-14: 25 mg via ORAL
  Filled 2014-11-14: qty 1

## 2014-11-14 MED ORDER — SALINE SPRAY 0.65 % NA SOLN
1.0000 | NASAL | Status: DC | PRN
  Filled 2014-11-14: qty 44

## 2014-11-14 MED ORDER — APIXABAN 5 MG PO TABS
5.0000 mg | ORAL_TABLET | Freq: Two times a day (BID) | ORAL | Status: DC
  Administered 2014-11-14 (×2): 5 mg via ORAL
  Filled 2014-11-14 (×3): qty 1

## 2014-11-14 MED ORDER — ACETAMINOPHEN 325 MG PO TABS
650.0000 mg | ORAL_TABLET | Freq: Once | ORAL | Status: AC
  Administered 2014-11-14: 650 mg via ORAL
  Filled 2014-11-14: qty 2

## 2014-11-14 MED ORDER — POTASSIUM CHLORIDE CRYS ER 20 MEQ PO TBCR: 20.0000 meq | EXTENDED_RELEASE_TABLET | Freq: Every day | ORAL | Status: DC

## 2014-11-14 MED ORDER — DONEPEZIL HCL 10 MG PO TABS
10.0000 mg | ORAL_TABLET | Freq: Every day | ORAL | Status: DC
  Filled 2014-11-14: qty 1

## 2014-11-14 MED ORDER — ASPIRIN 325 MG PO TABS: 325.0000 mg | ORAL_TABLET | Freq: Every day | ORAL | Status: DC | PRN

## 2014-11-14 MED ORDER — FUROSEMIDE 40 MG PO TABS
40.0000 mg | ORAL_TABLET | Freq: Two times a day (BID) | ORAL | Status: DC
  Administered 2014-11-14: 40 mg via ORAL
  Filled 2014-11-14: qty 1

## 2014-11-14 MED ORDER — ATORVASTATIN CALCIUM 40 MG PO TABS
40.0000 mg | ORAL_TABLET | Freq: Every day | ORAL | Status: DC
  Administered 2014-11-14: 40 mg via ORAL
  Filled 2014-11-14 (×2): qty 1

## 2014-11-14 NOTE — ED Notes (Signed)
Patient up and ambulatory in room. At this time patient appears restless stating "I aint going to sleep, I may as well get up" patient up to chair in room. This nurse provided recliner chair for patient comfort. Patient is pleasant, and cooperative at this time.

## 2014-11-14 NOTE — ED Notes (Signed)
Pt ate 50% of breakfast.  Denies any complaints at this time other than wanting to leave.

## 2014-11-14 NOTE — BH Assessment (Signed)
Received call from Pt's daughter, Adonnis Salceda [phone], who said she is concerned that Pt has been more agitated for the past two weeks. He has used profanity and postured like he might hit someone. She described that other family member are taking advantage of Pt's memory loss and competing to acquire Pt's property. Pt states she has trouble managing Pt's behavior, especially when he promises property to other family members. Pt's daughter knows of no evidence that Pt is suicidal, homicidal or responding to hallucinations.  Discussed case with Arlester Marker, NP who recommends Pt be evaluated by psychiatry in the morning. Communicated this recommendation to Dr. Leonides Schanz at [facility].   Orpah Greek Anson Fret, [place], [facility], [facility] Triage Specialist [phone]

## 2014-11-14 NOTE — ED Provider Notes (Signed)
08:10- I have been informed that the administrative supervisor at the behavioral health [facility]. Would like the patient to be dispositioned home because he has dementia. Previously, TTS, has suggested psychiatry evaluation this morning. I will contact them again to discuss the situation and arrange for appropriate treatment.   11:30- . He has been evaluated by tele-psychiatry. They have discussed the situation with his daughter. Daughter is here at this time. She understands the findings and recommended treatment plan. She is focused on social problems that apparently are aggravating the patient. She understands that she should talk to his PCP, and can consider law-enforcement, if needed to help with this.  Patient discharged with prescription for potassium. He is on Lasix. Recommended follow-up 3-4 days with PCP.  Daleen Bo, MD 11/14/14 1154

## 2014-11-14 NOTE — BH Assessment (Signed)
Received notification of TTS consult request. Spoke to Elnora Morrison, MD who said to proceed with tele-assessment. Tele-assessment will be initiated.  Orpah Greek Anson Fret, [place], [facility], [facility] Triage Specialist [phone]

## 2014-11-14 NOTE — Consult Note (Signed)
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 Dementia in conditions classified elsewhere with aggressive behavior [F02.81]     Priority: High  . Fever [R50.9] 05/30/2014  . Sepsis [A41.9] 05/30/2014  . Altered mental status [R41.82]   . Blood poisoning [A41.9]   . Esophageal dysphagia [R13.14] 11/12/2013  . Loss of weight [R63.4] 11/12/2013  . Abnormal weight loss [R63.4] 11/12/2013  . Early satiety [R68.81] 11/12/2013  . Malnutrition of moderate degree [E44.0] 09/06/2013  . Atrial fibrillation with rapid ventricular response [I48.91] 09/05/2013  . Acute on chronic systolic CHF (congestive heart failure), NYHA class 3 [I50.23] 09/05/2013  . Dementia [F03.90] 09/05/2013  . CHF (congestive heart failure) [I50.9] 09/05/2013  . Chronic systolic CHF (congestive heart failure) [I50.22] 05/17/2013  . A-fib [I48.91] 05/16/2013  . Chest tightness [R07.89] 05/15/2013  . New onset atrial fibrillation [I48.91] 05/15/2013  . Pulmonary edema [J81.1] 05/15/2013  . Chest pain [R07.9] 05/15/2013  . Prostatic hypertrophy [N40.0] 03/20/2013  . Anemia [D64.9] 01/11/2011  . Arteriosclerotic cardiovascular disease (ASCVD) [I25.10]   . Peripheral vascular disease [I73.9]   . Diffuse large B cell lymphoma [C83.30] 03/18/2010  . Tobacco abuse, in remission [F17.201] 03/18/2010  . CEREBROVASCULAR DISEASE [I67.9] 03/18/2010  . DISC DISEASE, CERVICAL [M50.30] 03/18/2010  . Essential hypertension [I10] 11/03/2008  . COPD [J44.9] 11/03/2008    Total Time spent with patient: 25 minutes  Subjective:   Curtis Lang is a 81 y.o. male patient admitted with reports of a behavioral change lasting approximately 2-5 weeks with verbal  aggression and impulsivity. Pt seen and chart reviewed. Pt was calm, cooperative, pleasant, and answered questions appropriately. He was oriented to self, situation, although not full context of such, location, president, but not time/year. Pt presented with classic dementia symptoms where he affirmed "yes, sure sure" that he knew the date, then when asked, he said "hold on, I know this, it's 2000-something, I know this". Pt is known to have moderate dementia per daughter, Butch Penny. Pt denies suicidal/homicidal ideation and psychosis and does not appear to be responding to internal stimuli. Pt cites good sleep and good appetite.  Collateral obtained from daughter via lengthy telephone conversation. Daughter Butch Penny), reported that pt was in [facility] and that she took him home with her 5 weeks ago after 35 years of minimal contact with pt. Butch Penny reports that this has been a major adjustment for both of them and that they have been having arguments due to pt's struggle for independence and wanting to be in control vs. His loss of autonomy and ability to perform financial tasks and other ADL's, including pt's frustration with wanting to run an errand to the hardware store, resulting in him driving the tractor there without her knowing. Butch Penny is requesting assistance with referrals to a neurologist to assist with a detailed diagnostic workup regarding pt's dementia, yet feels safe taking pt home at this time, admitting that she feels she has caused some of the tension between them due to her caregiver role strain and increased responsibility to care for him along with feelings of guilt and inadequacy in doing so.   HPI:   Curtis Lang is an 81 y.o. male, Caucasian who presents to [facility] ED accompanied by his daughter, Swanson Farnell [phone], who  was not present at time of assessment. Pt states he does not know why he was brought to the ED, that his family wanted him to come. According to documentation by  Dr. Rosalyn Gess, per daughter, patient has been behaving abnormally with verbal abuse for the past 2 weeks, patient has had associated increased episodes of confusion, that Pt hasn't been drinking enough fluids and daughter states that she is "fed up and doesn't deserve to be treated this way." According Pt to Pt's record he has been diagnosed with dementia.   Pt denies any depressive symptoms and he states he has been feeling "pretty good." He denies any problems with anxiety. He denies any physical complaints. He denies current suicidal ideation or history of suicide attempts. 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 He denies any alcohol or substance abuse. Pt cannot identify any particular stressors.  Attempted three times to contact Pt's daughter Gillis Boardley at [phone] without success. Calls go directly to unidentified voicemail therefore no message was left. Per ED staff, Pt's family is not in ED.  Pt is dressed in hospital scrubs. He is alert and oriented to person and place but not day, month or year. 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 His speech is loud in volume but otherwise normal. Motor behavior appears normal. Eye contact is good. Pt's mood is euthymic and affect is appropriate to situation. Thought process is coherent and relevant. There is no indication Pt is currently responding to internal stimuli or experiencing delusional thought content. Pt was calm and cooperative throughout assessment.   Past Medical History:  Past Medical History  Diagnosis Date  . Arteriosclerotic cardiovascular disease (ASCVD)     BMS to RCA 1997; cutting ballon for RCA restenosis in 2001; cath 12/10 - 100% RCA L->R collaterals; nl EF; 40% LAD and OM1  . Hypertension   . Hyperlipidemia   . Cerebrovascular disease     bilateral bruits; duplex in 2009 - mild plaque w/o stenosis  . Peripheral vascular  disease     ABI on 0.87 on left  . Tobacco abuse, in remission     60 pack years; discontinued in 2003  . COPD (chronic obstructive pulmonary disease)   . Fasting hyperglycemia   . DDD (degenerative disc disease), cervical   . Muscle discomfort     No improvement after statins temporary discontinued  . Hypercholesteremia   . Lymphoma     chemotherapy and radiation therapy in 2006; recurrence in 2007  . Squamous cell carcinoma     s/p Mohs surgery  . Cancer     b cell lymphoma  . 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 Dementia   . CHF (congestive heart failure)   . Atrial fibrillation     Past Surgical History  Procedure Laterality Date  . Anterior fusion cervical spine  2003    posterior?  . Inguinal hernia repair  1996    Right  . Orif femur fracture  1996  . Mohs surgery    . Colonoscopy  05/16/2011    Procedure: COLONOSCOPY;  Surgeon: Jamesetta So; diverticulosis, sigmoid polypectomy  . Port-a-cath removal Right 03/10/2013    Procedure: REMOVAL PORT-A-CATH;  Surgeon: Scherry Ran, MD;  Location: [facility];  Service: General;  Laterality: Right;  . Pyloric stenosis surgery      infant   Family History:  Family History  Problem Relation  Age of Onset  . Stroke Father     deceased - 12  . Stroke Mother     deceased - 77   . Heart attack Brother 35  . Heart attack Sister   . Colon cancer Neg Hx    Social History:  History  Alcohol Use No     History  Drug Use No    History   Social History  . Marital Status: Divorced    Spouse Name: N/A  . Number of Children: N/A  . Years of Education: N/A   Social History Main Topics  . Smoking status: Former Smoker -- 1.00 packs/day for 50 years    Types: Cigarettes    Start date: 09/19/1945    Quit date: 06/12/1996  . Smokeless tobacco: Never Used  . Alcohol Use: No  . Drug Use: No  . Sexual Activity: No   Other Topics Concern  . None   Social History Narrative   Divorced; retired; does not get  regular exercise.    Additional Social History:    Pain Medications: Denies abuse Prescriptions: Denies abuse Over the Counter: Denies abuse History of alcohol / drug use?: No history of alcohol / drug abuse Longest period of sobriety (when/how long): NA                     Allergies:   Allergies  Allergen Reactions  . Levaquin [Levofloxacin] Nausea And Vomiting    Labs:  Results for orders placed or performed during the hospital encounter of 11/13/14 (from the past 48 hour(s))  Urinalysis, Routine w reflex microscopic (not at [facility])     Status: Abnormal   Collection Time: 11/13/14 10:00 PM  Result Value Ref Range   Color, Urine YELLOW YELLOW   APPearance CLEAR CLEAR   Specific Gravity, Urine 1.015 1.005 - 1.030   pH 6.0 5.0 - 8.0   Glucose, UA NEGATIVE NEGATIVE mg/dL   Hgb urine dipstick TRACE (A) NEGATIVE   Bilirubin Urine NEGATIVE NEGATIVE   Ketones, ur NEGATIVE NEGATIVE mg/dL   Protein, ur NEGATIVE NEGATIVE mg/dL   Urobilinogen, UA 0.2 0.0 - 1.0 mg/dL   Nitrite NEGATIVE NEGATIVE   Leukocytes, UA NEGATIVE NEGATIVE  Urine rapid drug screen (hosp performed)not at [facility]     Status: None   Collection Time: 11/13/14 10:00 PM  Result Value Ref Range   Opiates NONE DETECTED NONE DETECTED   Cocaine NONE DETECTED NONE DETECTED   Benzodiazepines NONE DETECTED NONE DETECTED   Amphetamines NONE DETECTED NONE DETECTED   Tetrahydrocannabinol NONE DETECTED NONE DETECTED   Barbiturates NONE DETECTED NONE DETECTED    Comment:        DRUG SCREEN FOR MEDICAL PURPOSES ONLY.  IF CONFIRMATION IS NEEDED FOR ANY PURPOSE, NOTIFY LAB WITHIN 5 DAYS.        LOWEST DETECTABLE LIMITS FOR URINE DRUG SCREEN Drug Class       Cutoff (ng/mL) Amphetamine      1000 Barbiturate      200 Benzodiazepine   295 Tricyclics       284 Opiates          300 Cocaine          300 THC              50   Urine microscopic-add on     Status: None   Collection Time: 11/13/14 10:00 PM  Result Value  Ref Range   Squamous Epithelial / LPF RARE RARE  WBC, UA 0-2 <3 WBC/hpf   RBC / HPF 0-2 <3 RBC/hpf   Bacteria, UA RARE RARE  Basic metabolic panel     Status: Abnormal   Collection Time: 11/13/14 10:03 PM  Result Value Ref Range   Sodium 138 135 - 145 mmol/L   Potassium 2.8 (L) 3.5 - 5.1 mmol/L   Chloride 98 (L) 101 - 111 mmol/L   CO2 28 22 - 32 mmol/L   Glucose, Bld 135 (H) 65 - 99 mg/dL   BUN 9 6 - 20 mg/dL   Creatinine, Ser 0.95 0.61 - 1.24 mg/dL   Calcium 8.4 (L) 8.9 - 10.3 mg/dL   GFR calc non Af Amer >60 >60 mL/min   GFR calc Af Amer >60 >60 mL/min    Comment: (NOTE) The eGFR has been calculated using the CKD EPI equation. This calculation has not been validated in all clinical situations. eGFR's persistently <60 mL/min signify possible Chronic Kidney Disease.    Anion gap 12 5 - 15  CBC with Differential/Platelet     Status: Abnormal   Collection Time: 11/13/14 10:03 PM  Result Value Ref Range   WBC 10.4 4.0 - 10.5 K/uL   RBC 3.52 (L) 4.22 - 5.81 MIL/uL   Hemoglobin 11.3 (L) 13.0 - 17.0 g/dL   HCT 33.6 (L) 39.0 - 52.0 %   MCV 95.5 78.0 - 100.0 fL   MCH 32.1 26.0 - 34.0 pg   MCHC 33.6 30.0 - 36.0 g/dL   RDW 13.6 11.5 - 15.5 %   Platelets 361 150 - 400 K/uL   Neutrophils Relative % 67 43 - 77 %   Neutro Abs 7.1 1.7 - 7.7 K/uL   Lymphocytes Relative 20 12 - 46 %   Lymphs Abs 2.1 0.7 - 4.0 K/uL   Monocytes Relative 8 3 - 12 %   Monocytes Absolute 0.8 0.1 - 1.0 K/uL   Eosinophils Relative 4 0 - 5 %   Eosinophils Absolute 0.4 0.0 - 0.7 K/uL   Basophils Relative 1 0 - 1 %   Basophils Absolute 0.1 0.0 - 0.1 K/uL  Ethanol     Status: None   Collection Time: 11/13/14 10:03 PM  Result Value Ref Range   Alcohol, Ethyl (B) <5 <5 mg/dL    Comment:        LOWEST DETECTABLE LIMIT FOR SERUM ALCOHOL IS 11 mg/dL FOR MEDICAL PURPOSES ONLY     Vitals: Blood pressure 133/78, pulse 112, temperature 98 F (36.7 C), temperature source Oral, resp. rate 16, height _0  (1.676  m), weight 74.844 kg (165 lb), SpO2 100 %.  Risk to Self: Suicidal Ideation: No Suicidal Intent: No Is patient at risk for suicide?: No Suicidal Plan?: No Access to Means: No What has been your use of drugs/alcohol within the last 12 months?: Pt denies How many times?: 0 Other Self Harm Risks: None Triggers for Past Attempts: None known Intentional Self Injurious Behavior: None Risk to Others: Homicidal Ideation: No Thoughts of Harm to Others: No Current Homicidal Intent: No Current Homicidal Plan: No Access to Homicidal Means: No Identified Victim: None History of harm to others?: No Assessment of Violence: None Noted Violent Behavior Description: None Does patient have access to weapons?: No Criminal Charges Pending?: No Does patient have a court date: No Prior Inpatient Therapy: Prior Inpatient Therapy: No Prior Therapy Dates: NA Prior Therapy Facilty/Provider(s): NA Reason for Treatment: NA Prior Outpatient Therapy: Prior Outpatient Therapy: No Prior Therapy Dates: NA Prior Therapy Facilty/Provider(s):  NA Reason for Treatment: NA Does patient have an ACCT team?: No Does patient have Intensive In-House Services?  : No Does patient have [facility] services? : No Does patient have P4CC services?: No  Current Facility-Administered Medications  Medication Dose Route Frequency Provider Last Rate Last Dose  . apixaban (ELIQUIS) tablet 5 mg  5 mg Oral BID Kristen N Ward, DO   5 mg at 11/14/14 7342  . aspirin tablet 325 mg  325 mg Oral Daily PRN Kristen N Ward, DO      . atorvastatin (LIPITOR) tablet 40 mg  40 mg Oral Daily Kristen N Ward, DO   40 mg at 11/14/14 0930  . donepezil (ARICEPT) tablet 10 mg  10 mg Oral QHS Kristen N Ward, DO      . feeding supplement (ENSURE ENLIVE) (ENSURE ENLIVE) liquid 237 mL  237 mL Oral Daily PRN Kristen N Ward, DO      . fluticasone (FLONASE) 50 MCG/ACT nasal spray 1 spray  1 spray Each Nare Daily Kristen N Ward, DO   1 spray at 11/14/14 0925  .  furosemide (LASIX) tablet 40 mg  40 mg Oral BID Kristen N Ward, DO   40 mg at 11/14/14 0719  . lisinopril (PRINIVIL,ZESTRIL) tablet 40 mg  40 mg Oral Daily Kristen N Ward, DO   40 mg at 11/14/14 0926  . metoprolol succinate (TOPROL-XL) 24 hr tablet 50 mg  50 mg Oral q morning - 10a Kristen N Ward, DO   50 mg at 11/14/14 8768  . sertraline (ZOLOFT) tablet 25 mg  25 mg Oral Daily Kristen N Ward, DO   25 mg at 11/14/14 1157  . sodium chloride (OCEAN) 0.65 % nasal spray 1 spray  1 spray Each Nare PRN Delice Bison Ward, DO       Current Outpatient Prescriptions  Medication Sig Dispense Refill  . acetaminophen (TYLENOL) 325 MG tablet Take 650 mg by mouth daily as needed for mild pain or moderate pain.    Marland Kitchen apixaban (ELIQUIS) 5 MG TABS tablet Take 1 tablet (5 mg total) by mouth 2 (two) times daily. 60 tablet 6  . aspirin 325 MG tablet Take 325 mg by mouth daily as needed for mild pain or moderate pain.    Marland Kitchen atorvastatin (LIPITOR) 40 MG tablet TAKE 1 TABLET BY MOUTH EVERY DAY 30 tablet 6  . donepezil (ARICEPT) 10 MG tablet Take 10 mg by mouth at bedtime.    . ENSURE (ENSURE) Take 237 mLs by mouth daily as needed (FOR NUTRITIONAL SUPPLEMENT).    . fluticasone (FLONASE) 50 MCG/ACT nasal spray Place 1 spray into both nostrils daily as needed.    . furosemide (LASIX) 40 MG tablet Take 1 tablet (40 mg total) by mouth 2 (two) times daily. 60 tablet 6  . lisinopril (PRINIVIL,ZESTRIL) 40 MG tablet Take 1 tablet (40 mg total) by mouth daily. 90 tablet 3  . metoprolol succinate (TOPROL-XL) 100 MG 24 hr tablet Take 50 mg by mouth every morning.     . sertraline (ZOLOFT) 25 MG tablet Take 1 tablet (25 mg total) by mouth daily. 30 tablet 5  . sodium chloride (OCEAN) 0.65 % SOLN nasal spray Place 1 spray into both nostrils as needed for congestion.    . feeding supplement (BOOST HIGH PROTEIN) LIQD Take 237 mLs by mouth 3 (three) times daily - between meals and at bedtime. (Patient not taking: Reported on 11/13/2014) 60 Can  0    Musculoskeletal: UTO, camera  Psychiatric  Specialty Exam: Physical Exam  Review of Systems  Psychiatric/Behavioral: Positive for memory loss (known dementia). Negative for depression, suicidal ideas and hallucinations. The patient is not nervous/anxious and does not have insomnia.   All other systems reviewed and are negative.   Blood pressure 133/78, pulse 112, temperature 98 F (36.7 C), temperature source Oral, resp. rate 16, height _0  (1.676 m), weight 74.844 kg (165 lb), SpO2 100 %.Body mass index is 26.64 kg/(m^2).  General Appearance: Casual and Fairly Groomed  Engineer, water::  Fair  Speech:  Clear and Coherent and Slow  Volume:  Normal  Mood:  Euthymic  Affect:  Appropriate and Congruent  Thought Process:  Loose and Loose, easily directed to conversation  Orientation:  Other:  Self, place, president, disoriented to time and full context of reasoning for being in the hospital  Thought Content:  WDL  Suicidal Thoughts:  No  Homicidal Thoughts:  No  Memory:  Immediate;   Poor Recent;   Poor Remote;   Poor  Judgement:  Fair  Insight:  Fair  Psychomotor Activity:  Decreased  Concentration:  Fair  Recall:  Poor  Fund of Knowledge:Poor  Language: Fair  Akathisia:  No  Handed:    AIMS (if indicated):     Assets:  Desire for Improvement Physical Health Resilience Social Support  ADL's:  Impaired  Cognition: Impaired,  Moderate  Sleep:       Treatment Plan Summary: Dementia in conditions classified elsewhere with aggressive behavior, stable for discharge   Disposition:  -Discharge home with daughter, Butch Penny. -[facility] to assist with Neurology referrals for diagnostic workup per family request  Benjamine Mola, FNP-BC 11/14/2014 09:32AM

## 2014-11-14 NOTE — ED Notes (Signed)
Patient ambulatory to and from restroom without deficit 
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
## 2014-11-14 NOTE — Discharge Instructions (Signed)
Follow-up with your doctor for checkup in 3-4 days. Start the potassium pills, tomorrow, to improve your potassium level. It will also help you to eat foods that are high in potassium.    Potassium Content of Foods Potassium is a mineral found in many foods and drinks. It helps keep fluids and minerals balanced in your body and affects how steadily your heart beats. Potassium also helps control your blood pressure and keep your muscles and nervous system healthy. Certain health conditions and medicines may change the balance of potassium in your body. When this happens, you can help balance your level of potassium through the foods that you do or do not eat. Your health care provider or dietitian may recommend an amount of potassium that you should have each day. The following lists of foods provide the amount of potassium (in parentheses) per serving in each item. HIGH IN POTASSIUM  The following foods and beverages have 200 mg or more of potassium per serving:  Apricots, 2 raw or 5 dry (200 mg).  Artichoke, 1 medium (345 mg).  Avocado, raw,  each (245 mg).  Banana, 1 medium (425 mg).  Beans, lima, or baked beans, canned,  cup (280 mg).  Beans, white, canned,  cup (595 mg).  Beef roast, 3 oz (320 mg).  Beef, ground, 3 oz (270 mg).  Beets, raw or cooked,  cup (260 mg).  Bran muffin, 2 oz (300 mg).  Broccoli,  cup (230 mg).  Brussels sprouts,  cup (250 mg).  Cantaloupe,  cup (215 mg).  Cereal, 100% bran,  cup (200-400 mg).  Cheeseburger, single, fast food, 1 each (225-400 mg).  Chicken, 3 oz (220 mg).  Clams, canned, 3 oz (535 mg).  Crab, 3 oz (225 mg).  Dates, 5 each (270 mg).  Dried beans and peas,  cup (300-475 mg).  Figs, dried, 2 each (260 mg).  Fish: halibut, tuna, cod, snapper, 3 oz (480 mg).  Fish: salmon, haddock, swordfish, perch, 3 oz (300 mg).  Fish, tuna, canned 3 oz (200 mg).  [place] fries, fast food, 3 oz (470 mg).  Granola with  fruit and nuts,  cup (200 mg).  Grapefruit juice,  cup (200 mg).  Greens, beet,  cup (655 mg).  Honeydew melon,  cup (200 mg).  Kale, raw, 1 cup (300 mg).  Kiwi, 1 medium (240 mg).  Kohlrabi, rutabaga, parsnips,  cup (280 mg).  Lentils,  cup (365 mg).  Mango, 1 each (325 mg).  Milk, chocolate, 1 cup (420 mg).  Milk: nonfat, low-fat, whole, buttermilk, 1 cup (350-380 mg).  Molasses, 1 Tbsp (295 mg).  Mushrooms,  cup (280) mg.  Nectarine, 1 each (275 mg).  Nuts: almonds, peanuts, hazelnuts, [place], cashew, mixed, 1 oz (200 mg).  Nuts, pistachios, 1 oz (295 mg).  Orange, 1 each (240 mg).  Orange juice,  cup (235 mg).  Papaya, medium,  fruit (390 mg).  Peanut butter, chunky, 2 Tbsp (240 mg).  Peanut butter, smooth, 2 Tbsp (210 mg).  Pear, 1 medium (200 mg).  Pomegranate, 1 whole (400 mg).  Pomegranate juice,  cup (215 mg).  Pork, 3 oz (350 mg).  Potato chips, salted, 1 oz (465 mg).  Potato, baked with skin, 1 medium (925 mg).  Potatoes, boiled,  cup (255 mg).  Potatoes, mashed,  cup (330 mg).  Prune juice,  cup (370 mg).  Prunes, 5 each (305 mg).  Pudding, chocolate,  cup (230 mg).  Pumpkin, canned,  cup (250 mg).  Raisins,  seedless,  cup (270 mg).  Seeds, sunflower or pumpkin, 1 oz (240 mg).  Soy milk, 1 cup (300 mg).  Spinach,  cup (420 mg).  Spinach, canned,  cup (370 mg).  Sweet potato, baked with skin, 1 medium (450 mg).  Swiss chard,  cup (480 mg).  Tomato or vegetable juice,  cup (275 mg).  Tomato sauce or puree,  cup (400-550 mg).  Tomato, raw, 1 medium (290 mg).  Tomatoes, canned,  cup (200-300 mg).  [place], 3 oz (250 mg).  Wheat germ, 1 oz (250 mg).  Winter squash,  cup (250 mg).  Yogurt, plain or fruited, 6 oz (260-435 mg).  Zucchini,  cup (220 mg). MODERATE IN POTASSIUM The following foods and beverages have 50-200 mg of potassium per serving:  Apple, 1 each (150 mg).  Apple juice,   cup (150 mg).  Applesauce,  cup (90 mg).  Apricot nectar,  cup (140 mg).  Asparagus, small spears,  cup or 6 spears (155 mg).  Bagel, cinnamon raisin, 1 each (130 mg).  Bagel, egg or plain, 4 in., 1 each (70 mg).  Beans, green,  cup (90 mg).  Beans, yellow,  cup (190 mg).  Beer, regular, 12 oz (100 mg).  Beets, canned,  cup (125 mg).  Blackberries,  cup (115 mg).  Blueberries,  cup (60 mg).  Bread, whole wheat, 1 slice (70 mg).  Broccoli, raw,  cup (145 mg).  Cabbage,  cup (150 mg).  Carrots, cooked or raw,  cup (180 mg).  Cauliflower, raw,  cup (150 mg).  Celery, raw,  cup (155 mg).  Cereal, bran flakes, cup (120-150 mg).  Cheese, cottage,  cup (110 mg).  Cherries, 10 each (150 mg).  Chocolate, 1 oz bar (165 mg).  Coffee, brewed 6 oz (90 mg).  Corn,  cup or 1 ear (195 mg).  Cucumbers,  cup (80 mg).  Egg, large, 1 each (60 mg).  Eggplant,  cup (60 mg).  Endive, raw, cup (80 mg).  English muffin, 1 each (65 mg).  Fish, orange roughy, 3 oz (150 mg).  Frankfurter, beef or pork, 1 each (75 mg).  Fruit cocktail,  cup (115 mg).  Grape juice,  cup (170 mg).  Grapefruit,  fruit (175 mg).  Grapes,  cup (155 mg).  Greens: kale, turnip, collard,  cup (110-150 mg).  Ice cream or frozen yogurt, chocolate,  cup (175 mg).  Ice cream or frozen yogurt, vanilla,  cup (120-150 mg).  Lemons, limes, 1 each (80 mg).  Lettuce, all types, 1 cup (100 mg).  Mixed vegetables,  cup (150 mg).  Mushrooms, raw,  cup (110 mg).  Nuts: walnuts, pecans, or macadamia, 1 oz (125 mg).  Oatmeal,  cup (80 mg).  Okra,  cup (110 mg).  Onions, raw,  cup (120 mg).  Peach, 1 each (185 mg).  Peaches, canned,  cup (120 mg).  Pears, canned,  cup (120 mg).  Peas, green, frozen,  cup (90 mg).  Peppers, green,  cup (130 mg).  Peppers, red,  cup (160 mg).  Pineapple juice,  cup (165 mg).  Pineapple, fresh or canned,  cup  (100 mg).  Plums, 1 each (105 mg).  Pudding, vanilla,  cup (150 mg).  Raspberries,  cup (90 mg).  Rhubarb,  cup (115 mg).  Rice, wild,  cup (80 mg).  Shrimp, 3 oz (155 mg).  Spinach, raw, 1 cup (170 mg).  Strawberries,  cup (125 mg).  Summer squash  cup (175-200 mg).  Swiss  chard, raw, 1 cup (135 mg).  Tangerines, 1 each (140 mg).  Tea, brewed, 6 oz (65 mg).  Turnips,  cup (140 mg).  Watermelon,  cup (85 mg).  Wine, red, table, 5 oz (180 mg).  Wine, white, table, 5 oz (100 mg). LOW IN POTASSIUM The following foods and beverages have less than 50 mg of potassium per serving.  Bread, white, 1 slice (30 mg).  Carbonated beverages, 12 oz (less than 5 mg).  Cheese, 1 oz (20-30 mg).  Cranberries,  cup (45 mg).  Cranberry juice cocktail,  cup (20 mg).  Fats and oils, 1 Tbsp (less than 5 mg).  Hummus, 1 Tbsp (32 mg).  Nectar: papaya, mango, or pear,  cup (35 mg).  Rice, white or brown,  cup (50 mg).  Spaghetti or macaroni,  cup cooked (30 mg).  Tortilla, flour or corn, 1 each (50 mg).  Waffle, 4 in., 1 each (50 mg).  Water chestnuts,  cup (40 mg). Document Released: 01/10/2005 Document Revised: 06/03/2013 Document Reviewed: 04/25/2013 [facility] Patient Information 2015 [place], [place]. This information is not intended to replace advice given to you by your health care provider. Make sure you discuss any questions you have with your health care provider.  Hypokalemia Hypokalemia means that the amount of potassium in the blood is lower than normal.Potassium is a chemical, called an electrolyte, that helps regulate the amount of fluid in the body. It also stimulates muscle contraction and helps nerves function properly.Most of the body's potassium is inside of cells, and only a very small amount is in the blood. Because the amount in the blood is so small, minor changes can be life-threatening. CAUSES  Antibiotics.  Diarrhea or  vomiting.  Using laxatives too much, which can cause diarrhea.  Chronic kidney disease.  Water pills (diuretics).  Eating disorders (bulimia).  Low magnesium level.  Sweating a lot. SIGNS AND SYMPTOMS  Weakness.  Constipation.  Fatigue.  Muscle cramps.  Mental confusion.  Skipped heartbeats or irregular heartbeat (palpitations).  Tingling or numbness. DIAGNOSIS  Your health care provider can diagnose hypokalemia with blood tests. In addition to checking your potassium level, your health care provider may also check other lab tests. TREATMENT Hypokalemia can be treated with potassium supplements taken by mouth or adjustments in your current medicines. If your potassium level is very low, you may need to get potassium through a vein (IV) and be monitored in the hospital. A diet high in potassium is also helpful. Foods high in potassium are:  Nuts, such as peanuts and pistachios.  Seeds, such as sunflower seeds and pumpkin seeds.  Peas, lentils, and lima beans.  Whole grain and bran cereals and breads.  Fresh fruit and vegetables, such as apricots, avocado, bananas, cantaloupe, kiwi, oranges, tomatoes, asparagus, and potatoes.  Orange and tomato juices.  Red meats.  Fruit yogurt. HOME CARE INSTRUCTIONS  Take all medicines as prescribed by your health care provider.  Maintain a healthy diet by including nutritious food, such as fruits, vegetables, nuts, whole grains, and lean meats.  If you are taking a laxative, be sure to follow the directions on the label. SEEK MEDICAL CARE IF:  Your weakness gets worse.  You feel your heart pounding or racing.  You are vomiting or having diarrhea.  You are diabetic and having trouble keeping your blood glucose in the normal range. SEEK IMMEDIATE MEDICAL CARE IF:  You have chest pain, shortness of breath, or dizziness.  You are vomiting or having diarrhea for  more than 2 days.  You faint. MAKE SURE YOU:    Understand these instructions.  Will watch your condition.  Will get help right away if you are not doing well or get worse. Document Released: 05/29/2005 Document Revised: 03/19/2013 Document Reviewed: 11/29/2012 [facility] Patient Information 2015 [place], [place]. This information is not intended to replace advice given to you by your health care provider. Make sure you discuss any questions you have with your health care provider.

## 2014-11-14 NOTE — ED Notes (Signed)
Patient sitting up in bed at this time, patient states  "im not going to sleep"  Patient provided blanked and positioned in a position of comfort.
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
## 2014-11-14 NOTE — ED Notes (Signed)
Pt expressing desire to leave at this time.  States that he will walk to [place] if necessary.  Pt redirected with breakfast tray.  Will continue to monitor closely for elopement risk.

## 2014-11-14 NOTE — Treatment Plan (Signed)
Pt to be seen by in telepsych by Catalina Pizza, DNP this AM.

## 2014-11-18 ENCOUNTER — Other Ambulatory Visit: Payer: Self-pay | Admitting: Cardiology

## 2014-12-22 ENCOUNTER — Other Ambulatory Visit: Payer: Self-pay | Admitting: Cardiology

## 2014-12-22 ENCOUNTER — Other Ambulatory Visit: Payer: Self-pay | Admitting: Neurology

## 2015-01-08 ENCOUNTER — Ambulatory Visit: Payer: 59 | Admitting: Neurology

## 2015-01-14 ENCOUNTER — Ambulatory Visit (INDEPENDENT_AMBULATORY_CARE_PROVIDER_SITE_OTHER): Payer: Medicare Other | Admitting: Cardiology

## 2015-01-14 ENCOUNTER — Encounter: Payer: Self-pay | Admitting: Cardiology

## 2015-01-14 VITALS — BP 135/64 | HR 76 | Ht 66.0 in | Wt 155.0 lb

## 2015-01-14 DIAGNOSIS — I10 Essential (primary) hypertension: Secondary | ICD-10-CM

## 2015-01-14 DIAGNOSIS — I4891 Unspecified atrial fibrillation: Secondary | ICD-10-CM | POA: Diagnosis not present

## 2015-01-14 DIAGNOSIS — I2510 Atherosclerotic heart disease of native coronary artery without angina pectoris: Secondary | ICD-10-CM

## 2015-01-14 DIAGNOSIS — I5022 Chronic systolic (congestive) heart failure: Secondary | ICD-10-CM

## 2015-01-14 MED ORDER — ASPIRIN EC 81 MG PO TBEC: 81.0000 mg | DELAYED_RELEASE_TABLET | ORAL | Status: DC | PRN

## 2015-01-14 MED ORDER — POTASSIUM CHLORIDE CRYS ER 20 MEQ PO TBCR: 20.0000 meq | EXTENDED_RELEASE_TABLET | Freq: Every day | ORAL | Status: DC

## 2015-01-14 MED ORDER — APIXABAN 5 MG PO TABS: 5.0000 mg | ORAL_TABLET | Freq: Two times a day (BID) | ORAL | Status: AC

## 2015-01-14 NOTE — Patient Instructions (Addendum)
   Continue Potassium at 45meq daily - refill sent to [facility] today.  Lab for BMET, Magnesium - order given today. Office will contact with results via phone or letter.   Continue your Eliquis at 5mg  twice a day. Stop Aspirin daily, may use as needed for pain. Continue all other medications.   Your physician wants you to follow up in: 6 months.  You will receive a reminder letter in the mail one-two months in advance.  If you don't receive a letter, please call our office to schedule the follow up appointment

## 2015-01-14 NOTE — Progress Notes (Signed)
Patient ID: OLUWADARASIMI REDMON, male   DOB: [birth year], 81 y.o.   MRN: [id number]     Clinical Summary Mr. Hudon is a 81 y.o.male seen today for follow up of the following medical problems.   1. CAD/Chronic systolic HF - prior PCI to RCA in 1997 with cutting balloon to RCA in 2001, last cath 05/2009 with chronically occluded RCA with left to right collaterals and moderate disease of LAD and LCX. LV gram at that time showed LVEF 55% with inferior hypokinesis.  -05/15/13 echo showed new finding of severe LV systolic dysfunction, LVEF 25-30%.  - medical therapy has been limited due to low heart rates, soft bp's at times  - denies any recent SOB or chest pain. No LE edema, no orthopnea, no PND - compliant with meds, his daughter keeps track of them.    2. Afib  - CHADS2Vasc score of 5, he remains on eliquis without any bleeding issues.  - denies any palpitations  3. Lymphoma  -He is in remission currently, he is followed by oncology  4. Dementia - followed by neurology - recent admit with behavior change, aggressive behavior  5. Hyperlipidemia - compliant with statin   Past Medical History  Diagnosis Date  . Arteriosclerotic cardiovascular disease (ASCVD)     BMS to RCA 1997; cutting ballon for RCA restenosis in 2001; cath 12/10 - 100% RCA L->R collaterals; nl EF; 40% LAD and OM1  . Hypertension   . Hyperlipidemia   . Cerebrovascular disease     bilateral bruits; duplex in 2009 - mild plaque w/o stenosis  . Peripheral vascular disease     ABI on 0.87 on left  . Tobacco abuse, in remission     60 pack years; discontinued in 2003  . COPD (chronic obstructive pulmonary disease)   . Fasting hyperglycemia   . DDD (degenerative disc disease), cervical   . Muscle discomfort     No improvement after statins temporary discontinued  . Hypercholesteremia   . Lymphoma     chemotherapy and radiation therapy in 2006; recurrence in 2007  . Squamous cell carcinoma     s/p Mohs  surgery  . Cancer     b cell lymphoma  . 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 Dementia   . CHF (congestive heart failure)   . Atrial fibrillation      Allergies  Allergen Reactions  . Levaquin [Levofloxacin] Nausea And Vomiting     Current Outpatient Prescriptions  Medication Sig Dispense Refill  . acetaminophen (TYLENOL) 325 MG tablet Take 650 mg by mouth daily as needed for mild pain or moderate pain.    Marland Kitchen aspirin 325 MG tablet Take 325 mg by mouth daily as needed for mild pain or moderate pain.    Marland Kitchen atorvastatin (LIPITOR) 40 MG tablet TAKE 1 TABLET BY MOUTH EVERY DAY 30 tablet 6  . donepezil (ARICEPT) 10 MG tablet Take 10 mg by mouth at bedtime.    Marland Kitchen ELIQUIS 5 MG TABS tablet TAKE 1 TABLET BY MOUTH 2 TIMES DAILY. 60 tablet 6  . ENSURE (ENSURE) Take 237 mLs by mouth daily as needed (FOR NUTRITIONAL SUPPLEMENT).    . feeding supplement (BOOST HIGH PROTEIN) LIQD Take 237 mLs by mouth 3 (three) times daily - between meals and at bedtime. (Patient not taking: Reported on 11/13/2014) 60 Can 0  . fluticasone (FLONASE) 50 MCG/ACT nasal spray Place 1 spray into both nostrils daily as needed.    . furosemide (LASIX) 40 MG  tablet TAKE 1 TABLET BY MOUTH 2 TIMES DAILY. 60 tablet 6  . lisinopril (PRINIVIL,ZESTRIL) 40 MG tablet Take 1 tablet (40 mg total) by mouth daily. 90 tablet 3  . metoprolol succinate (TOPROL-XL) 100 MG 24 hr tablet Take 50 mg by mouth every morning.     . potassium chloride SA (K-DUR,KLOR-CON) 20 MEQ tablet Take 1 tablet (20 mEq total) by mouth daily. 10 tablet 0  . sertraline (ZOLOFT) 25 MG tablet TAKE 1 TABLET BY MOUTH DAILY. 30 tablet 5  . sodium chloride (OCEAN) 0.65 % SOLN nasal spray Place 1 spray into both nostrils as needed for congestion.     No current facility-administered medications for this visit.     Past Surgical History  Procedure Laterality Date  . Anterior fusion cervical spine  2003    posterior?  . Inguinal hernia repair  1996    Right  .  Orif femur fracture  1996  . Mohs surgery    . Colonoscopy  05/16/2011    Procedure: COLONOSCOPY;  Surgeon: Jamesetta So; diverticulosis, sigmoid polypectomy  . Port-a-cath removal Right 03/10/2013    Procedure: REMOVAL PORT-A-CATH;  Surgeon: Scherry Ran, MD;  Location: [facility];  Service: General;  Laterality: Right;  . Pyloric stenosis surgery      infant     Allergies  Allergen Reactions  . Levaquin [Levofloxacin] Nausea And Vomiting      Family History  Problem Relation Age of Onset  . Stroke Father     deceased - 63  . Stroke Mother     deceased - 67   . Heart attack Brother 59  . Heart attack Sister   . Colon cancer Neg Hx      Social History Mr. Holsinger reports that he quit smoking about 18 years ago. His smoking use included Cigarettes. He started smoking about 69 years ago. He has a 50 pack-year smoking history. He has never used smokeless tobacco. Mr. Sarr reports that he does not drink alcohol.   Review of Systems CONSTITUTIONAL: No weight loss, fever, chills, weakness or fatigue.  HEENT: Eyes: No visual loss, blurred vision, double vision or yellow sclerae.No hearing loss, sneezing, congestion, runny nose or sore throat.  SKIN: No rash or itching.  CARDIOVASCULAR: per HPI RESPIRATORY: No shortness of breath, cough or sputum.  GASTROINTESTINAL: No anorexia, nausea, vomiting or diarrhea. No abdominal pain or blood.  GENITOURINARY: No burning on urination, no polyuria NEUROLOGICAL: No headache, dizziness, syncope, paralysis, ataxia, numbness or tingling in the extremities. No change in bowel or bladder control.  MUSCULOSKELETAL: No muscle, back pain, joint pain or stiffness.  LYMPHATICS: No enlarged nodes. No history of splenectomy.  PSYCHIATRIC: No history of depression or anxiety.  ENDOCRINOLOGIC: No reports of sweating, cold or heat intolerance. No polyuria or polydipsia.  Marland Kitchen   Physical Examination Filed Vitals:   01/14/15 1111  BP: 135/64    Pulse: 76   Filed Vitals:   01/14/15 1111  Height: 5\' 6"  (1.676 m)  Weight: 155 lb (70.308 kg)    Gen: resting comfortably, no acute distress HEENT: no scleral icterus, pupils equal round and reactive, no palptable cervical adenopathy,  CV: RRR, no m/r/g, no jvd Resp: Clear to auscultation bilaterally GI: abdomen is soft, non-tender, non-distended, normal bowel sounds, no hepatosplenomegaly MSK: extremities are warm, no edema.  Skin: warm, no rash Neuro:  no focal deficits Psych: appropriate affect   Diagnostic Studies 05/16/13 Echo  LVEF 25-30%, mild LVH, increased LA pressure, mild  MR,mild RV dysfunction     Assessment and Plan  1. CAD/Systolic dysfunction  - prior history of CAD with interventions, recently found to have severe LV systolic dysfunction LVEF 25-30%. This is most likely secondary to ischemic cardiomyopathy  -. We have not pursued aggressive invasive testing or ICD consideration due to his severe dementia and mulitple comorbidities, currently managing medically.  - did not tolerate higher Toprol doses. Have not started aldactone as patient has severe demenita, and daughter working hard to managed an already complicated regiment - continue current meds   2. Afib  - continue rate control, patient with CHADS2Vascscore of 5, continue eliquis  3. Lymphoma  - continue to follow with onc.  4. Dementia - continue to follow with neuro   F/u 6 months. Check BMET and Mg.     Arnoldo Lenis, M.D.

## 2015-01-19 NOTE — Patient Outreach (Signed)
[facility] [facility]) Care Management  01/19/2015  [place] November 18, 1930 [id number]   Referral from [place] List, assigned Mariann Laster, RN to outreach.  Thanks, Ronnell Freshwater. [place], [place] Assistant Phone: [phone] Fax: [phone]

## 2015-01-22 ENCOUNTER — Encounter: Payer: Self-pay | Admitting: Neurology

## 2015-01-22 ENCOUNTER — Ambulatory Visit (INDEPENDENT_AMBULATORY_CARE_PROVIDER_SITE_OTHER): Payer: Medicare Other | Admitting: Neurology

## 2015-01-22 VITALS — BP 138/70 | HR 58 | Resp 20 | Ht 66.0 in | Wt 154.9 lb

## 2015-01-22 DIAGNOSIS — G309 Alzheimer's disease, unspecified: Secondary | ICD-10-CM | POA: Diagnosis not present

## 2015-01-22 DIAGNOSIS — F02818 Dementia in other diseases classified elsewhere, unspecified severity, with other behavioral disturbance: Secondary | ICD-10-CM

## 2015-01-22 DIAGNOSIS — F0151 Vascular dementia with behavioral disturbance: Secondary | ICD-10-CM | POA: Diagnosis not present

## 2015-01-22 DIAGNOSIS — I2510 Atherosclerotic heart disease of native coronary artery without angina pectoris: Secondary | ICD-10-CM | POA: Diagnosis not present

## 2015-01-22 NOTE — Progress Notes (Signed)
NEUROLOGY FOLLOW UP OFFICE NOTE  Curtis Lang [id number]  HISTORY OF PRESENT ILLNESS: Curtis Lang is an 81 year old right-handed man with history of CHF, atrial fibrillation (on Eliquis), arteriosclerotic cardiovascular disease, hypertension, hyperlipidemia, cerebrovascular disease, peripheral vascular disease, COPD, B cell lymphoma status post chemo and radiation in 2006 with recurrence in 2007, squamous cell carcinoma status post Mohs surgery who follows up for mixed Alzheimer's and vascular dementia.  Records, labs and head CT reviewed.  He is accompanied by his daughter who provides some history.  Labs and images of head CT from ED visit in June reviewed.  UPDATE: He is currently taking Aricept 10mg  daily.  He did not start Namenda due to cost.  He was also started on Zoloft 25mg  daily for irritability, which helped a bit.  However, he sometimes has sudden outbursts and combativeness.   He was seen in the ED in June for such behavioral changes.  CT of head was negative for a subdural.  Labs were unremarkable.  His daughter says he is often irritable, however he is not combative.  He does talk loudly often.  His daughter is living with him in his house.  He is able to dress, bathe and fix a sandwich (not use stove), but his daughter administers his medications to him.  He usually sleeps well.  He denies depression.  HISTORY: He has had a gradual cognitive decline over the past 4 years. It is not clear if initial symptoms were problems with memory. He would have some periods of confusion. Up until April, when his daughter moved back to live with him, he was living on his own. He began forgetting to take his medications or was confused on the proper way to take his medications. He began forgetting to pay bills and forgot to file his taxes. He would sometimes forget names of acquaintances. He had some problems with remote memory as well. When talking about her past event, he will not remember  what is daughter was talking about. He no longer drives but previously he did get lost while driving. He was not involved in any accidents or near accidents. He says he sleeps pretty well but his daughter can sometimes hear him walking around the house at night. He is able to perform all his activities of daily living such as bathing and dressing. He never used a cook for himself and often eats out. His appetite is pretty good overall. He has not exhibited any change in behavior or personality. However, he does seem a little more irritable and cusses more often. He did have an episode of hallucinations when he thought he saw a young man sitting in his house. Prior to moving back home, his daughter has noted that he stop sending Christmas and birthday cards approximately 3-4 years ago. Also, when talking to him on the phone, he appeared to lose his train of thought and forget what he was going to say midsentence. Do to Lasix, he does occasionally have urinary incontinence. He does have history of soft stool and has had bowel incontinence as well. When she moved back home, his house did not appear to be filthy, messy or in disarray. He does not have any episodes of sundowning or combativeness. He was recently admitted to the hospital for CHF exacerbation.  His father had history of strokes as well as confusion. His brother is may have had history of cognitive problems as well.  He currently takes Aricept 10mg  at bedtime.  Currently, he takes Aricept 5mg  daily.  04/22/13 CT HEAD WO:  diffuse atrophy and chronic small vessel ischemic changes.  PAST MEDICAL HISTORY: Past Medical History  Diagnosis Date  . Arteriosclerotic cardiovascular disease (ASCVD)     BMS to RCA 1997; cutting ballon for RCA restenosis in 2001; cath 12/10 - 100% RCA L->R collaterals; nl EF; 40% LAD and OM1  . Hypertension   . Hyperlipidemia   . Cerebrovascular disease     bilateral bruits; duplex in 2009 - mild plaque w/o stenosis  .  Peripheral vascular disease     ABI on 0.87 on left  . Tobacco abuse, in remission     60 pack years; discontinued in 2003  . COPD (chronic obstructive pulmonary disease)   . Fasting hyperglycemia   . DDD (degenerative disc disease), cervical   . Muscle discomfort     No improvement after statins temporary discontinued  . Hypercholesteremia   . Lymphoma     chemotherapy and radiation therapy in 2006; recurrence in 2007  . Squamous cell carcinoma     s/p Mohs surgery  . Cancer     b cell lymphoma  . 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 Dementia   . CHF (congestive heart failure)   . Atrial fibrillation     MEDICATIONS: Current Outpatient Prescriptions on File Prior to Visit  Medication Sig Dispense Refill  . acetaminophen (TYLENOL) 325 MG tablet Take 650 mg by mouth daily as needed for mild pain or moderate pain.    Marland Kitchen apixaban (ELIQUIS) 5 MG TABS tablet Take 1 tablet (5 mg total) by mouth 2 (two) times daily.    Marland Kitchen aspirin EC 81 MG tablet Take 1 tablet (81 mg total) by mouth as needed for mild pain.    Marland Kitchen atorvastatin (LIPITOR) 40 MG tablet TAKE 1 TABLET BY MOUTH EVERY DAY 30 tablet 6  . donepezil (ARICEPT) 10 MG tablet Take 10 mg by mouth at bedtime.    . ENSURE (ENSURE) Take 237 mLs by mouth daily as needed (FOR NUTRITIONAL SUPPLEMENT).    . fluticasone (FLONASE) 50 MCG/ACT nasal spray Place 1 spray into both nostrils daily as needed.    . furosemide (LASIX) 40 MG tablet TAKE 1 TABLET BY MOUTH 2 TIMES DAILY. 60 tablet 6  . lisinopril (PRINIVIL,ZESTRIL) 40 MG tablet Take 1 tablet (40 mg total) by mouth daily. 90 tablet 3  . metoprolol succinate (TOPROL-XL) 100 MG 24 hr tablet Take 50 mg by mouth every morning.     . potassium chloride SA (K-DUR,KLOR-CON) 20 MEQ tablet Take 1 tablet (20 mEq total) by mouth daily. 30 tablet 11  . sertraline (ZOLOFT) 25 MG tablet TAKE 1 TABLET BY MOUTH DAILY. 30 tablet 5   No current facility-administered medications on file prior to visit.     ALLERGIES: Allergies  Allergen Reactions  . Levaquin [Levofloxacin] Nausea And Vomiting    FAMILY HISTORY: Family History  Problem Relation Age of Onset  . Stroke Father     deceased - 49  . Stroke Mother     deceased - 13   . Heart attack Brother 19  . Heart attack Sister   . Colon cancer Neg Hx     SOCIAL HISTORY: Social History   Social History  . Marital Status: Divorced    Spouse Name: N/A  . Number of Children: N/A  . Years of Education: N/A   Occupational History  . Not on file.   Social History Main Topics  .  Smoking status: Former Smoker -- 1.00 packs/day for 50 years    Types: Cigarettes    Start date: 09/19/1945    Quit date: 06/12/1996  . Smokeless tobacco: Never Used  . Alcohol Use: No  . Drug Use: No  . Sexual Activity: No   Other Topics Concern  . Not on file   Social History Narrative   Divorced; retired; does not get regular exercise.     REVIEW OF SYSTEMS: Constitutional: No fevers, chills, or sweats, no generalized fatigue, change in appetite Eyes: No visual changes, double vision, eye pain Ear, nose and throat: No hearing loss, ear pain, nasal congestion, sore throat Cardiovascular: No chest pain, palpitations Respiratory:  No shortness of breath at rest or with exertion, wheezes GastrointestinaI: No nausea, vomiting, diarrhea, abdominal pain, fecal incontinence Genitourinary:  No dysuria, urinary retention or frequency Musculoskeletal:  No neck pain, back pain Integumentary: No rash, pruritus, skin lesions Neurological: as above Psychiatric: No depression, insomnia, anxiety Endocrine: No palpitations, fatigue, diaphoresis, mood swings, change in appetite, change in weight, increased thirst Hematologic/Lymphatic:  No anemia, purpura, petechiae. Allergic/Immunologic: no itchy/runny eyes, nasal congestion, recent allergic reactions, rashes  PHYSICAL EXAM: Filed Vitals:   01/22/15 1524  BP: 138/70  Pulse: 58  Resp: 20    General: No acute distress, manic.   Head:  Normocephalic/atraumatic Eyes:  Fundi not visualized on exam Neck: supple, no paraspinal tenderness, full range of motion Heart:  Regular rate and rhythm Lungs:  Clear to auscultation bilaterally Back: No paraspinal tenderness Neurological Exam: alert and oriented to self. Attention span and concentration, recent memory intact, and fund of knowledge poor.  Speech fluent and not dysarthric, language intact.  CN II-XII intact.  Bulk and tone normal, muscle strength 5/5 throughout.  Sensation to light touch intact.  Deep tendon reflexes 2+ throughout.  Finger to nose testing intact.  Gait normal.  IMPRESSION: Mixed Alzheimer's and vascular dementia with behavioral changes  PLAN: Aricept 10mg  daily Zoloft 25mg  daily for irritability Eliquis  Statin (LDL goal should be less than 100) Referral to psychiatry to help with managing mood and behavior. Follow up in 9 months  Metta Clines, DO  CC:  Sharilyn Sites, MD

## 2015-01-22 NOTE — Patient Instructions (Signed)
Continue Aricept 10mg  daily We will refer you to psychiatry to help with your behavior and mood changes Follow up in 9 months.

## 2015-01-26 ENCOUNTER — Other Ambulatory Visit: Payer: Self-pay | Admitting: Cardiology

## 2015-01-26 LAB — BASIC METABOLIC PANEL
BUN: 12 mg/dL (ref 7–25)
CO2: 30 mmol/L (ref 20–31)
Calcium: 9.1 mg/dL (ref 8.6–10.3)
Chloride: 102 mmol/L (ref 98–110)
Creat: 0.97 mg/dL (ref 0.70–1.11)
GLUCOSE: 168 mg/dL — AB (ref 65–99)
POTASSIUM: 3.9 mmol/L (ref 3.5–5.3)
SODIUM: 141 mmol/L (ref 135–146)

## 2015-01-26 LAB — MAGNESIUM: Magnesium: 1.9 mg/dL (ref 1.5–2.5)

## 2015-01-27 ENCOUNTER — Telehealth: Payer: Self-pay | Admitting: *Deleted

## 2015-01-27 NOTE — Telephone Encounter (Signed)
-----   Message from Arnoldo Lenis, MD sent at 01/27/2015 10:41 AM EDT ----- Labs look good  Zandra Abts MD

## 2015-01-27 NOTE — Telephone Encounter (Signed)
Pt aware, routed to pcp 

## 2015-02-01 ENCOUNTER — Other Ambulatory Visit: Payer: Self-pay

## 2015-02-01 NOTE — Patient Outreach (Signed)
[facility]) Care Management  02/01/2015  [place] 06/11/1931 [id number]   Telephonic Care Management Note: (Triage)  Referral Date: 01/19/15 Referral Source: MD Referral Issue:  CHF, COPD with 2 admissions, 1 ED visit and 1 SNF stay over the past 12 months.  H/O Advance Directive on file with [facility] 05/24/2014 PCP:  Dr. Sharilyn Sites  Outreach call #1 to patients and caregiver/daughter/Donna Goytia.  Contact not reached.  RN CM rescheduled for next contact call within one week.   Mariann Laster, RN, BSN, [facility], CCM  [employer] Management Coordinator [phone] Direct [phone] Cell [phone] Office [phone] Fax

## 2015-02-03 ENCOUNTER — Ambulatory Visit: Payer: 59

## 2015-02-04 ENCOUNTER — Telehealth: Payer: Self-pay

## 2015-02-04 NOTE — Patient Outreach (Signed)
[facility]) Care Management  02/04/2015  [place] Sep 11, 1930 [id number]   Telephonic Care Management Note:    Referral Date: 01/19/15 Referral Source: MD Referral Issue: CHF, COPD with 2 admissions, 1 ED visit and 1 SNF stay over the past 12 months.  H/O Advance Directive on file with [facility] Pont[facility] 05/24/2014 PCP: Dr. Sharilyn Sites - last appt 01/2015 Patient also sees a Cardiologist and Neurologist  Inbound call from caregiver/daughter/Donna Bogucki.  Social: Daughter states she quit her job to live with patient to provide care due to patient starting having some dementia and was not taking his medications.  States patient is active and no difficulty with mobility.   Medications: States conditions have improved since patient getting his medications as prescribed.  Taking more than 10 medications but has no issues.  Patient weighs daily to manage  CHF/COPD.  Daughter states she is aware of CHF & COPD Sx's and denies need for [facility] services at this time.   Plan:  RN CM notified [place] Assistant to close case:  Refused services RN CM will notify Primary MD:  Dr. Hilma Favors via case closure letter.  RN CM advised should needs arise; consult primary MD for new referral to [facility].   Mariann Laster, RN, BSN, [facility], CCM  [employer] Management Coordinator [phone] Direct [phone] Cell [phone] Office [phone] Fax

## 2015-02-12 NOTE — Patient Outreach (Signed)
[place] [facility]) Care Management  02/12/2015  [place] Dec 02, 1930 [id number]   Notification from Mariann Laster, RN to close case due to patient refused [place] Management services.  Thanks, Ronnell Freshwater. [place], [place] Assistant Phone: [phone] Fax: [phone]

## 2015-02-16 ENCOUNTER — Other Ambulatory Visit: Payer: Self-pay | Admitting: Cardiology

## 2015-03-17 ENCOUNTER — Encounter (HOSPITAL_COMMUNITY): Payer: Medicare Other | Attending: Hematology & Oncology

## 2015-03-17 DIAGNOSIS — Z8572 Personal history of non-Hodgkin lymphomas: Secondary | ICD-10-CM

## 2015-03-17 DIAGNOSIS — C8330 Diffuse large B-cell lymphoma, unspecified site: Secondary | ICD-10-CM | POA: Diagnosis present

## 2015-03-17 LAB — CBC WITH DIFFERENTIAL/PLATELET
BASOS ABS: 0.1 10*3/uL (ref 0.0–0.1)
BASOS PCT: 1 %
EOS ABS: 0.4 10*3/uL (ref 0.0–0.7)
EOS PCT: 4 %
HCT: 39.2 % (ref 39.0–52.0)
Hemoglobin: 13.4 g/dL (ref 13.0–17.0)
LYMPHS PCT: 15 %
Lymphs Abs: 1.5 10*3/uL (ref 0.7–4.0)
MCH: 33.1 pg (ref 26.0–34.0)
MCHC: 34.2 g/dL (ref 30.0–36.0)
MCV: 96.8 fL (ref 78.0–100.0)
MONO ABS: 0.7 10*3/uL (ref 0.1–1.0)
Monocytes Relative: 7 %
Neutro Abs: 7.1 10*3/uL (ref 1.7–7.7)
Neutrophils Relative %: 74 %
PLATELETS: 301 10*3/uL (ref 150–400)
RBC: 4.05 MIL/uL — AB (ref 4.22–5.81)
RDW: 13.9 % (ref 11.5–15.5)
WBC: 9.7 10*3/uL (ref 4.0–10.5)

## 2015-03-17 LAB — LACTATE DEHYDROGENASE: LDH: 210 U/L — AB (ref 98–192)

## 2015-03-17 LAB — COMPREHENSIVE METABOLIC PANEL
ALBUMIN: 4.1 g/dL (ref 3.5–5.0)
ALT: 17 U/L (ref 17–63)
AST: 25 U/L (ref 15–41)
Alkaline Phosphatase: 102 U/L (ref 38–126)
Anion gap: 9 (ref 5–15)
BUN: 10 mg/dL (ref 6–20)
CHLORIDE: 102 mmol/L (ref 101–111)
CO2: 29 mmol/L (ref 22–32)
CREATININE: 0.86 mg/dL (ref 0.61–1.24)
Calcium: 8.5 mg/dL — ABNORMAL LOW (ref 8.9–10.3)
GFR calc Af Amer: >60 mL/min (ref >60)
GLUCOSE: 149 mg/dL — AB (ref 65–99)
POTASSIUM: 3.5 mmol/L (ref 3.5–5.1)
SODIUM: 140 mmol/L (ref 135–145)
Total Bilirubin: 1 mg/dL (ref 0.3–1.2)
Total Protein: 8.1 g/dL (ref 6.5–8.1)

## 2015-03-17 LAB — SEDIMENTATION RATE: Sed Rate: 46 mm/hr — ABNORMAL HIGH (ref 0–16)

## 2015-03-18 LAB — BETA 2 MICROGLOBULIN, SERUM: BETA 2 MICROGLOBULIN: 2.7 mg/L — AB (ref 0.6–2.4)

## 2015-03-18 NOTE — Progress Notes (Signed)
Labs drawn

## 2015-03-19 ENCOUNTER — Encounter (HOSPITAL_COMMUNITY): Payer: Self-pay | Admitting: Oncology

## 2015-03-19 ENCOUNTER — Ambulatory Visit (HOSPITAL_COMMUNITY): Payer: 59 | Admitting: Oncology

## 2015-03-19 ENCOUNTER — Encounter (HOSPITAL_BASED_OUTPATIENT_CLINIC_OR_DEPARTMENT_OTHER): Payer: Medicare Other | Admitting: Oncology

## 2015-03-19 VITALS — BP 171/69 | HR 90 | Temp 98.3°F | Resp 20 | Wt 159.0 lb

## 2015-03-19 DIAGNOSIS — Z8572 Personal history of non-Hodgkin lymphomas: Secondary | ICD-10-CM | POA: Diagnosis not present

## 2015-03-19 DIAGNOSIS — F0150 Vascular dementia without behavioral disturbance: Secondary | ICD-10-CM

## 2015-03-19 DIAGNOSIS — Z23 Encounter for immunization: Secondary | ICD-10-CM

## 2015-03-19 DIAGNOSIS — C8330 Diffuse large B-cell lymphoma, unspecified site: Secondary | ICD-10-CM

## 2015-03-19 DIAGNOSIS — Z0000 Encounter for general adult medical examination without abnormal findings: Secondary | ICD-10-CM

## 2015-03-19 DIAGNOSIS — L989 Disorder of the skin and subcutaneous tissue, unspecified: Secondary | ICD-10-CM | POA: Diagnosis present

## 2015-03-19 DIAGNOSIS — G309 Alzheimer's disease, unspecified: Secondary | ICD-10-CM | POA: Diagnosis not present

## 2015-03-19 MED ORDER — INFLUENZA VAC SPLIT QUAD 0.5 ML IM SUSY
0.5000 mL | PREFILLED_SYRINGE | Freq: Once | INTRAMUSCULAR | Status: AC
  Administered 2015-03-19: 0.5 mL via INTRAMUSCULAR

## 2015-03-19 MED ORDER — INFLUENZA VAC SPLIT QUAD 0.5 ML IM SUSY
PREFILLED_SYRINGE | INTRAMUSCULAR | Status: AC
  Filled 2015-03-19: qty 0.5

## 2015-03-19 NOTE — Patient Instructions (Signed)
[place] at [facility] Discharge Instructions  RECOMMENDATIONS MADE BY THE CONSULTANT AND ANY TEST RESULTS WILL BE SENT TO YOUR REFERRING PHYSICIAN.  You will return in one year for labs and for follow up.   We recommend that you follow up with dermatology.   You were given your flu shot today.   Please call us with any questions or concerns.    Thank you for choosing [place] at [facility] to provide your oncology and hematology care.  To afford each patient quality time with our provider, please arrive at least 15 minutes before your scheduled appointment time.    You need to re-schedule your appointment should you arrive 10 or more minutes late.  We strive to give you quality time with our providers, and arriving late affects you and other patients whose appointments are after yours.  Also, if you no show three or more times for appointments you may be dismissed from the clinic at the providers discretion.     Again, thank you for choosing [facility].  Our hope is that these requests will decrease the amount of time that you wait before being seen by our physicians.       _____________________________________________________________  Should you have questions after your visit to [facility], please contact our office at [phone] between the hours of 8:30 a.m. and 4:30 p.m.  Voicemails left after 4:30 p.m. will not be returned until the following business day.  For prescription refill requests, have your pharmacy contact our office.

## 2015-03-19 NOTE — Progress Notes (Signed)
Curtis Kilts, MD [address] [place] [address]  Preventative health care - Plan: Influenza vac split quadrivalent PF (FLUARIX) injection 0.5 mL  Diffuse large B-cell lymphoma, unspecified body region [facility]) - Plan: CBC with Differential, Comprehensive metabolic panel, Lactate dehydrogenase, Sedimentation rate, Beta 2 microglobuline, serum, C-reactive protein  CURRENT THERAPY: Surveillance per NCCN guidelines  INTERVAL HISTORY: Curtis Lang 81 y.o. male returns for  regular  visit for followup of Diffuse large B-cell lymphoma, CD20 positive, presented with a very large left axillary mass in August 2006, treated with R-CHOP followed by radiation therapy completed as of July 2007. The radiation was actually utilized after he appeared to have recurrence in the lymph node and the left axilla, but since then he has remained disease free.   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  He is on Aracept and he was referred to psychiatry to help with managing mood and behavior.  I personally reviewed and went over laboratory results with the patient.  The results are noted within this dictation.  Labs are very good and stable without any worrisome findings from a hematologic standpoint.   He denies any B symptoms.  His weight is stable.    Hematologically, he denies any complaints and ROS questioning is negative.   Past Medical History  Diagnosis Date  . Arteriosclerotic cardiovascular disease (ASCVD)     BMS to RCA 1997; cutting ballon for RCA restenosis in 2001; cath 12/10 - 100% RCA L->R collaterals; nl EF; 40% LAD and OM1  . Hypertension   . Hyperlipidemia   . Cerebrovascular disease     bilateral bruits; duplex in 2009 - mild plaque w/o stenosis  . Peripheral vascular disease (HCC)     ABI on 0.87 on left  . Tobacco abuse, in remission     60 pack years;  discontinued in 2003  . COPD (chronic obstructive pulmonary disease) ([place])   . Fasting hyperglycemia   . DDD (degenerative disc disease), cervical   . Muscle discomfort     No improvement after statins temporary discontinued  . Hypercholesteremia   . Lymphoma ([place])     chemotherapy and radiation therapy in 2006; recurrence in 2007  . Squamous cell carcinoma [facility])     s/p Mohs surgery  . Cancer ([place])     b cell lymphoma  . Diffuse large B cell lymphoma ([place]) 03/18/2010  . Dementia   . CHF (congestive heart failure) ([place])   . Atrial fibrillation ([place])     has Diffuse large B cell lymphoma ([place]); Tobacco abuse, in remission; Essential hypertension; CEREBROVASCULAR DISEASE; COPD; DISC DISEASE, CERVICAL; Arteriosclerotic cardiovascular disease (ASCVD); Peripheral vascular disease ([place]); Anemia; Prostatic hypertrophy; Chest tightness; New onset atrial fibrillation ([place]); Pulmonary edema; Chest pain; A-fib ([place]); Chronic systolic CHF (congestive heart failure) ([place]); Atrial fibrillation with rapid ventricular response ([place]); Acute on chronic systolic CHF (congestive heart failure), NYHA class 3 ([place]); Dementia; CHF (congestive heart failure) ([place]); Malnutrition of moderate degree ([place]); Esophageal dysphagia; Loss of weight; Abnormal weight loss; Early satiety; Fever; Altered mental status; Blood poisoning ([place]); Sepsis ([place]); Dementia in conditions classified elsewhere with aggressive behavior; and Mixed Alzheimer's and vascular dementia with behavior disturbances on his problem list.     is allergic to levaquin.  We administered Influenza vac split quadrivalent PF.  Past Surgical History  Procedure Laterality Date  . Anterior fusion cervical spine  2003  posterior?  . Inguinal hernia repair  1996    Right  . Orif femur fracture  1996  . Mohs surgery    . Colonoscopy  05/16/2011    Procedure: COLONOSCOPY;  Surgeon: Jamesetta So; diverticulosis, sigmoid polypectomy  . Port-a-cath removal  Right 03/10/2013    Procedure: REMOVAL PORT-A-CATH;  Surgeon: Scherry Ran, MD;  Location: [facility];  Service: General;  Laterality: Right;  . Pyloric stenosis surgery      infant    Denies any headaches, dizziness, double vision, fevers, chills, night sweats, nausea, vomiting, diarrhea, constipation, chest pain, heart palpitations, shortness of breath, blood in stool, black tarry stool, urinary pain, urinary burning, urinary frequency, hematuria.   PHYSICAL EXAMINATION  ECOG PERFORMANCE STATUS: 1 - Symptomatic but completely ambulatory  Filed Vitals:   03/19/15 1252  BP: 171/69  Pulse: 90  Temp: 98.3 F (36.8 C)  Resp: 20    GENERAL:alert, no distress, well nourished, well developed, comfortable, cooperative and smiling SKIN: skin color, texture, turgor are normal, no rashes.  Multiple SKs on back, right dorsal aspect skin lesion. HEAD: Normocephalic, No masses, tenderness.  Right parietal/frontal skin lesion that is raised is noted. EYES: normal, PERRLA, EOMI, Conjunctiva are pink and non-injected EARS: External ears normal OROPHARYNX:no exudate, no erythema, lips, buccal mucosa, and tongue normal and mucous membranes are moist  NECK: supple, no adenopathy, thyroid normal size, non-tender, without nodularity, no stridor, non-tender, trachea midline LYMPH:  no palpable lymphadenopathy, no hepatosplenomegaly BREAST:not examined LUNGS: clear to auscultation and percussion HEART: irregularly irregular, S1 normal and S2 normal ABDOMEN:abdomen soft, non-tender, normal bowel sounds, no masses or organomegaly and no hepatosplenomegaly BACK: Back symmetric, no curvature., No CVA tenderness.  Multiple SKs on back EXTREMITIES:less then 2 second capillary refill, no joint deformities, effusion, or inflammation, no edema, no skin discoloration, no clubbing, no cyanosis  NEURO: alert & oriented x 3 with fluent speech, no focal motor/sensory deficits, gait normal, definite element of  dementia.  LABORATORY DATA: CBC    Component Value Date/Time   WBC 9.7 03/17/2015 1045   RBC 4.05* 03/17/2015 1045   RBC 4.13* 03/16/2014 1214   HGB 13.4 03/17/2015 1045   HCT 39.2 03/17/2015 1045   PLT 301 03/17/2015 1045   MCV 96.8 03/17/2015 1045   MCH 33.1 03/17/2015 1045   MCHC 34.2 03/17/2015 1045   RDW 13.9 03/17/2015 1045   LYMPHSABS 1.5 03/17/2015 1045   MONOABS 0.7 03/17/2015 1045   EOSABS 0.4 03/17/2015 1045   BASOSABS 0.1 03/17/2015 1045      Chemistry      Component Value Date/Time   NA 140 03/17/2015 1045   K 3.5 03/17/2015 1045   CL 102 03/17/2015 1045   CO2 29 03/17/2015 1045   BUN 10 03/17/2015 1045   CREATININE 0.86 03/17/2015 1045   CREATININE 0.97 01/26/2015 1158      Component Value Date/Time   CALCIUM 8.5* 03/17/2015 1045   ALKPHOS 102 03/17/2015 1045   AST 25 03/17/2015 1045   ALT 17 03/17/2015 1045   BILITOT 1.0 03/17/2015 1045     Lab Results  Component Value Date   LDH 210* 03/17/2015  ]   Lab Results  Component Value Date   PSA 2.99 03/16/2014       ASSESSMENT/PLAN:   Diffuse large B cell lymphoma (HCC) Diffuse large B-cell lymphoma, CD20 positive, presented with a very large left axillary mass in August 2006, treated with R-CHOP followed by radiation therapy completed as of July  2007. The radiation was actually utilized after he appeared to have recurrence in the lymph node and the left axilla, but since then he has remained disease free.  Lymph node biopsy on 02/05/2008 negative for recurrent disease, showing only reactive hyperplasia.   NED  Chart reviewed.  I personally reviewed and went over laboratory results with the patient.  The results are noted within this dictation. LDH is noted to be stable.  Labs in 12 months: CBC diff, CMET, LDH, ESR, CRP, B2M  Patient is accompanied by his daughter and caregiver.  They are concerned about a number of skin lesions.  The lesions on his back are SKs and benign appearing.  He  does have a raised lesion on the right parietal region of scalp that he picks at.  This should be looked at by dermatology.  He also has a right dorsal aspect of hand skin lesion that can be looked at by dermatology too as it bleeds from time to time.  Following the patient's visit, the patient's caregiver entered into the dictation without invitation for medical advice for herself.  I have recommended she discuss this with her primary care provider.  Following this, the patient's daughter, again uninvited, came into the dictation room to talk about the patient's caregiver and issues.  Return in 12 months for follow-up.     THERAPY PLAN:  Patient educated on signs and symptoms to report to the [facility] including B symptoms.  Otherwise, we will monitor counts and see him back in 12 months for follow-up.  He is 10 years out from initial diagnosis of NHL.   All questions were answered. The patient knows to call the clinic with any problems, questions or concerns. We can certainly see the patient much sooner if necessary.  Patient and plan discussed with Dr. Nelida Meuse and he is in agreement with the aforementioned.   KEFALAS,THOMAS 03/19/2015 3:12 PM

## 2015-03-19 NOTE — Assessment & Plan Note (Addendum)
Diffuse large B-cell lymphoma, CD20 positive, presented with a very large left axillary mass in August 2006, treated with R-CHOP followed by radiation therapy completed as of July 2007. The radiation was actually utilized after he appeared to have recurrence in the lymph node and the left axilla, but since then he has remained disease free.  Lymph node biopsy on 02/05/2008 negative for recurrent disease, showing only reactive hyperplasia.   NED  Chart reviewed.  I personally reviewed and went over laboratory results with the patient.  The results are noted within this dictation. LDH is noted to be stable.  Labs in 12 months: CBC diff, CMET, LDH, ESR, CRP, B2M  Patient is accompanied by his daughter and caregiver.  They are concerned about a number of skin lesions.  The lesions on his back are SKs and benign appearing.  He does have a raised lesion on the right parietal region of scalp that he picks at.  This should be looked at by dermatology.  He also has a right dorsal aspect of hand skin lesion that can be looked at by dermatology too as it bleeds from time to time.  Following the patient's visit, the patient's caregiver entered into the dictation without invitation for medical advice for herself.  I have recommended she discuss this with her primary care provider.  Following this, the patient's daughter, again uninvited, came into the dictation room to talk about the patient's caregiver and issues.  Return in 12 months for follow-up.

## 2015-03-19 NOTE — Progress Notes (Signed)
Curtis Lang presents today for injection per MD orders. Flu Vaccine administered IM in left Upper Arm. Administration without incident. Patient tolerated well.

## 2015-03-24 ENCOUNTER — Other Ambulatory Visit: Payer: Self-pay | Admitting: Cardiology

## 2015-04-02 ENCOUNTER — Emergency Department (HOSPITAL_COMMUNITY)
Admission: EM | Admit: 2015-04-02 | Discharge: 2015-04-02 | Disposition: A | Discharge status: Home / Self Care | Payer: Medicare Other | Attending: Emergency Medicine | Admitting: Emergency Medicine

## 2015-04-02 ENCOUNTER — Emergency Department (HOSPITAL_COMMUNITY): Payer: Medicare Other

## 2015-04-02 ENCOUNTER — Encounter (HOSPITAL_COMMUNITY): Payer: Self-pay | Admitting: *Deleted

## 2015-04-02 DIAGNOSIS — E785 Hyperlipidemia, unspecified: Secondary | ICD-10-CM | POA: Diagnosis not present

## 2015-04-02 DIAGNOSIS — R410 Disorientation, unspecified: Secondary | ICD-10-CM | POA: Diagnosis not present

## 2015-04-02 DIAGNOSIS — Z79899 Other long term (current) drug therapy: Secondary | ICD-10-CM | POA: Diagnosis not present

## 2015-04-02 DIAGNOSIS — Z7901 Long term (current) use of anticoagulants: Secondary | ICD-10-CM | POA: Insufficient documentation

## 2015-04-02 DIAGNOSIS — I509 Heart failure, unspecified: Secondary | ICD-10-CM | POA: Diagnosis not present

## 2015-04-02 DIAGNOSIS — Z7951 Long term (current) use of inhaled steroids: Secondary | ICD-10-CM | POA: Insufficient documentation

## 2015-04-02 DIAGNOSIS — I10 Essential (primary) hypertension: Secondary | ICD-10-CM | POA: Diagnosis not present

## 2015-04-02 DIAGNOSIS — F0151 Vascular dementia with behavioral disturbance: Secondary | ICD-10-CM

## 2015-04-02 DIAGNOSIS — I2510 Atherosclerotic heart disease of native coronary artery without angina pectoris: Secondary | ICD-10-CM | POA: Diagnosis not present

## 2015-04-02 DIAGNOSIS — Z859 Personal history of malignant neoplasm, unspecified: Secondary | ICD-10-CM | POA: Insufficient documentation

## 2015-04-02 DIAGNOSIS — Z8673 Personal history of transient ischemic attack (TIA), and cerebral infarction without residual deficits: Secondary | ICD-10-CM | POA: Insufficient documentation

## 2015-04-02 DIAGNOSIS — Z7982 Long term (current) use of aspirin: Secondary | ICD-10-CM | POA: Insufficient documentation

## 2015-04-02 DIAGNOSIS — G309 Alzheimer's disease, unspecified: Secondary | ICD-10-CM

## 2015-04-02 DIAGNOSIS — Z008 Encounter for other general examination: Secondary | ICD-10-CM | POA: Diagnosis present

## 2015-04-02 DIAGNOSIS — F02818 Dementia in other diseases classified elsewhere, unspecified severity, with other behavioral disturbance: Secondary | ICD-10-CM

## 2015-04-02 DIAGNOSIS — F0281 Dementia in other diseases classified elsewhere with behavioral disturbance: Secondary | ICD-10-CM | POA: Diagnosis not present

## 2015-04-02 DIAGNOSIS — E7800 Pure hypercholesterolemia, unspecified: Secondary | ICD-10-CM | POA: Insufficient documentation

## 2015-04-02 DIAGNOSIS — G308 Other Alzheimer's disease: Secondary | ICD-10-CM | POA: Diagnosis not present

## 2015-04-02 DIAGNOSIS — J449 Chronic obstructive pulmonary disease, unspecified: Secondary | ICD-10-CM | POA: Insufficient documentation

## 2015-04-02 LAB — CBC WITH DIFFERENTIAL/PLATELET
BASOS PCT: 1 %
Basophils Absolute: 0.1 10*3/uL (ref 0.0–0.1)
EOS ABS: 0.4 10*3/uL (ref 0.0–0.7)
Eosinophils Relative: 5 %
HCT: 37.7 % — ABNORMAL LOW (ref 39.0–52.0)
HEMOGLOBIN: 12.6 g/dL — AB (ref 13.0–17.0)
Lymphocytes Relative: 22 %
Lymphs Abs: 1.7 10*3/uL (ref 0.7–4.0)
MCH: 32.8 pg (ref 26.0–34.0)
MCHC: 33.4 g/dL (ref 30.0–36.0)
MCV: 98.2 fL (ref 78.0–100.0)
MONOS PCT: 7 %
Monocytes Absolute: 0.5 10*3/uL (ref 0.1–1.0)
NEUTROS PCT: 65 %
Neutro Abs: 5.3 10*3/uL (ref 1.7–7.7)
PLATELETS: 251 10*3/uL (ref 150–400)
RBC: 3.84 MIL/uL — AB (ref 4.22–5.81)
RDW: 14 % (ref 11.5–15.5)
WBC: 8 10*3/uL (ref 4.0–10.5)

## 2015-04-02 LAB — COMPREHENSIVE METABOLIC PANEL
ALK PHOS: 109 U/L (ref 38–126)
ALT: 18 U/L (ref 17–63)
ANION GAP: 10 (ref 5–15)
AST: 26 U/L (ref 15–41)
Albumin: 4 g/dL (ref 3.5–5.0)
BILIRUBIN TOTAL: 0.6 mg/dL (ref 0.3–1.2)
BUN: 13 mg/dL (ref 6–20)
CALCIUM: 9.1 mg/dL (ref 8.9–10.3)
CO2: 28 mmol/L (ref 22–32)
Chloride: 102 mmol/L (ref 101–111)
Creatinine, Ser: 0.91 mg/dL (ref 0.61–1.24)
GFR calc non Af Amer: >60 mL/min (ref >60)
Glucose, Bld: 131 mg/dL — ABNORMAL HIGH (ref 65–99)
Potassium: 4.3 mmol/L (ref 3.5–5.1)
Sodium: 140 mmol/L (ref 135–145)
TOTAL PROTEIN: 7.7 g/dL (ref 6.5–8.1)

## 2015-04-02 LAB — URINALYSIS, ROUTINE W REFLEX MICROSCOPIC
BILIRUBIN URINE: NEGATIVE
GLUCOSE, UA: NEGATIVE mg/dL
Ketones, ur: NEGATIVE mg/dL
Leukocytes, UA: NEGATIVE
Nitrite: NEGATIVE
PH: 7 (ref 5.0–8.0)
Protein, ur: NEGATIVE mg/dL
SPECIFIC GRAVITY, URINE: 1.01 (ref 1.005–1.030)
UROBILINOGEN UA: 0.2 mg/dL (ref 0.0–1.0)

## 2015-04-02 LAB — LIPASE, BLOOD: Lipase: 32 U/L (ref 11–51)

## 2015-04-02 LAB — URINE MICROSCOPIC-ADD ON

## 2015-04-02 MED ORDER — LORAZEPAM 1 MG PO TABS
1.0000 mg | ORAL_TABLET | Freq: Once | ORAL | Status: AC
  Administered 2015-04-02: 1 mg via ORAL
  Filled 2015-04-02: qty 1

## 2015-04-02 MED ORDER — LORAZEPAM 1 MG PO TABS: 1.0000 mg | ORAL_TABLET | Freq: Three times a day (TID) | ORAL | Status: DC | PRN

## 2015-04-02 NOTE — ED Notes (Signed)
EDP in to eval 

## 2015-04-02 NOTE — Discharge Instructions (Signed)
Follow-up with neurology to take the Ativan as directed for anxiety this is temporary. Will return for any new or worse symptoms.

## 2015-04-02 NOTE — ED Provider Notes (Signed)
CSN: [id number]     Arrival date & time 04/02/15  1258 History   First MD Initiated Contact with Patient 04/02/15 1351     Chief Complaint  Patient presents with  . Pain  . Medical Clearance     (Consider location/radiation/quality/duration/timing/severity/associated sxs/prior Treatment) The history is provided by the patient and a relative.   date-year-old gentleman with a history of Alzheimer's. Brought in by family because he got frustrated and got aggressive. Patient was seen for similar problem in June. They wonder him cleared medically to make sure that there is no medical cause. Patient is followed by neurology in [place]. Patient's already on Aricept. Patient himself is alert not completely oriented but has no specific complaints. States that he gets frustrated and has a short fuse. No history of any obvious illness symptoms. According to family no history of fevers no nausea or vomiting no complaint of chest pain belly pain. No upper respiratory infection symptoms. Patient has a history of chronic neck pain from previous neck surgeries and has lidocaine patches in place. Patient here not complaining of specific neck pain.  Past Medical History  Diagnosis Date  . Arteriosclerotic cardiovascular disease (ASCVD)     BMS to RCA 1997; cutting ballon for RCA restenosis in 2001; cath 12/10 - 100% RCA L->R collaterals; nl EF; 40% LAD and OM1  . Hypertension   . Hyperlipidemia   . Cerebrovascular disease     bilateral bruits; duplex in 2009 - mild plaque w/o stenosis  . Peripheral vascular disease (HCC)     ABI on 0.87 on left  . Tobacco abuse, in remission     60 pack years; discontinued in 2003  . COPD (chronic obstructive pulmonary disease) ([place])   . Fasting hyperglycemia   . DDD (degenerative disc disease), cervical   . Muscle discomfort     No improvement after statins temporary discontinued  . Hypercholesteremia   . Lymphoma ([place])     chemotherapy and radiation therapy in  2006; recurrence in 2007  . Squamous cell carcinoma [facility])     s/p Mohs surgery  . Cancer ([place])     b cell lymphoma  . Diffuse large B cell lymphoma ([place]) 03/18/2010  . Dementia   . CHF (congestive heart failure) ([place])   . Atrial fibrillation [facility])    Past Surgical History  Procedure Laterality Date  . Anterior fusion cervical spine  2003    posterior?  . Inguinal hernia repair  1996    Right  . Orif femur fracture  1996  . Mohs surgery    . Colonoscopy  05/16/2011    Procedure: COLONOSCOPY;  Surgeon: Jamesetta So; diverticulosis, sigmoid polypectomy  . Port-a-cath removal Right 03/10/2013    Procedure: REMOVAL PORT-A-CATH;  Surgeon: Scherry Ran, MD;  Location: [facility];  Service: General;  Laterality: Right;  . Pyloric stenosis surgery      infant   Family History  Problem Relation Age of Onset  . Stroke Father     deceased - 64  . Stroke Mother     deceased - 69   . Heart attack Brother 20  . Heart attack Sister   . Colon cancer Neg Hx    Social History  Substance Use Topics  . Smoking status: Former Smoker -- 1.00 packs/day for 50 years    Types: Cigarettes    Start date: 09/19/1945    Quit date: 06/12/1996  . Smokeless tobacco: Never Used  . Alcohol Use: No  Review of Systems  Hematological: Bruises/bleeds easily.  Psychiatric/Behavioral: Positive for behavioral problems and confusion.   otherwise level V caveat applies to the review of systems due to his history of Alzheimer's and dementia.    Allergies  Levaquin  Home Medications   Prior to Admission medications   Medication Sig Start Date End Date Taking? Authorizing Provider  acetaminophen (TYLENOL) 325 MG tablet Take 650 mg by mouth daily as needed for mild pain or moderate pain.   Yes Historical Provider, MD  apixaban (ELIQUIS) 5 MG TABS tablet Take 1 tablet (5 mg total) by mouth 2 (two) times daily. 01/14/15  Yes Arnoldo Lenis, MD  aspirin EC 81 MG tablet Take 1 tablet (81 mg total) by  mouth as needed for mild pain. 01/14/15  Yes Arnoldo Lenis, MD  atorvastatin (LIPITOR) 40 MG tablet TAKE 1 TABLET BY MOUTH EVERY DAY 02/16/15  Yes Arnoldo Lenis, MD  donepezil (ARICEPT) 10 MG tablet Take 10 mg by mouth at bedtime.   Yes Historical Provider, MD  ENSURE (ENSURE) Take 237 mLs by mouth daily as needed (FOR NUTRITIONAL SUPPLEMENT).   Yes Historical Provider, MD  fluticasone (FLONASE) 50 MCG/ACT nasal spray Place 2 sprays into both nostrils daily as needed for allergies or rhinitis.  09/22/14  Yes Historical Provider, MD  furosemide (LASIX) 40 MG tablet TAKE 1 TABLET BY MOUTH 2 TIMES DAILY. 11/19/14  Yes Arnoldo Lenis, MD  lisinopril (PRINIVIL,ZESTRIL) 40 MG tablet Take 1 tablet (40 mg total) by mouth daily. 08/19/14  Yes Arnoldo Lenis, MD  metoprolol succinate (TOPROL-XL) 100 MG 24 hr tablet Take 50 mg by mouth every morning.  05/25/14  Yes Historical Provider, MD  potassium chloride SA (K-DUR,KLOR-CON) 20 MEQ tablet Take 1 tablet (20 mEq total) by mouth daily. Patient taking differently: Take 10 mEq by mouth 2 (two) times daily.  01/14/15  Yes Arnoldo Lenis, MD  sertraline (ZOLOFT) 25 MG tablet TAKE 1 TABLET BY MOUTH DAILY. 12/22/14  Yes Adam Telford Nab, DO  LORazepam (ATIVAN) 1 MG tablet Take 1 tablet (1 mg total) by mouth 3 (three) times daily as needed for anxiety. 04/02/15   Fredia Sorrow, MD   BP 146/71 mmHg  Pulse 61  Temp(Src) 98 F (36.7 C) (Oral)  Resp 16  SpO2 100% Physical Exam  Constitutional: He appears well-developed and well-nourished.  HENT:  Head: Normocephalic and atraumatic.  Mouth/Throat: Oropharynx is clear and moist.  Eyes: Conjunctivae and EOM are normal. Pupils are equal, round, and reactive to light.  Neck: Normal range of motion. Neck supple.  Bilateral lidocaine patches on the posterior aspect of the neck.  Cardiovascular: Normal rate, regular rhythm and normal heart sounds.   No murmur heard. Pulmonary/Chest: Effort normal and breath sounds  normal. No respiratory distress.  Abdominal: Soft. Bowel sounds are normal. There is no tenderness.  Musculoskeletal: Normal range of motion. He exhibits no edema.  Neurological: He is alert. No cranial nerve deficit. He exhibits normal muscle tone. Coordination normal.  Skin: Skin is warm.  Nursing note and vitals reviewed.   ED Course  Procedures (including critical care time) Labs Review Labs Reviewed  COMPREHENSIVE METABOLIC PANEL - Abnormal; Notable for the following:    Glucose, Bld 131 (*)    All other components within normal limits  CBC WITH DIFFERENTIAL/PLATELET - Abnormal; Notable for the following:    RBC 3.84 (*)    Hemoglobin 12.6 (*)    HCT 37.7 (*)    All  other components within normal limits  URINALYSIS, ROUTINE W REFLEX MICROSCOPIC (NOT AT [facility]) - Abnormal; Notable for the following:    Hgb urine dipstick TRACE (*)    All other components within normal limits  LIPASE, BLOOD  URINE MICROSCOPIC-ADD ON   Results for orders placed or performed during the hospital encounter of 04/02/15  Comprehensive metabolic panel  Result Value Ref Range   Sodium 140 135 - 145 mmol/L   Potassium 4.3 3.5 - 5.1 mmol/L   Chloride 102 101 - 111 mmol/L   CO2 28 22 - 32 mmol/L   Glucose, Bld 131 (H) 65 - 99 mg/dL   BUN 13 6 - 20 mg/dL   Creatinine, Ser 0.91 0.61 - 1.24 mg/dL   Calcium 9.1 8.9 - 10.3 mg/dL   Total Protein 7.7 6.5 - 8.1 g/dL   Albumin 4.0 3.5 - 5.0 g/dL   AST 26 15 - 41 U/L   ALT 18 17 - 63 U/L   Alkaline Phosphatase 109 38 - 126 U/L   Total Bilirubin 0.6 0.3 - 1.2 mg/dL   GFR calc non Af Amer >60 >60 mL/min   GFR calc Af Amer >60 >60 mL/min   Anion gap 10 5 - 15  Lipase, blood  Result Value Ref Range   Lipase 32 11 - 51 U/L  CBC with Differential/Platelet  Result Value Ref Range   WBC 8.0 4.0 - 10.5 K/uL   RBC 3.84 (L) 4.22 - 5.81 MIL/uL   Hemoglobin 12.6 (L) 13.0 - 17.0 g/dL   HCT 37.7 (L) 39.0 - 52.0 %   MCV 98.2 78.0 - 100.0 fL   MCH 32.8 26.0 - 34.0  pg   MCHC 33.4 30.0 - 36.0 g/dL   RDW 14.0 11.5 - 15.5 %   Platelets 251 150 - 400 K/uL   Neutrophils Relative % 65 %   Neutro Abs 5.3 1.7 - 7.7 K/uL   Lymphocytes Relative 22 %   Lymphs Abs 1.7 0.7 - 4.0 K/uL   Monocytes Relative 7 %   Monocytes Absolute 0.5 0.1 - 1.0 K/uL   Eosinophils Relative 5 %   Eosinophils Absolute 0.4 0.0 - 0.7 K/uL   Basophils Relative 1 %   Basophils Absolute 0.1 0.0 - 0.1 K/uL  Urinalysis, Routine w reflex microscopic (not at [facility])  Result Value Ref Range   Color, Urine YELLOW YELLOW   APPearance CLEAR CLEAR   Specific Gravity, Urine 1.010 1.005 - 1.030   pH 7.0 5.0 - 8.0   Glucose, UA NEGATIVE NEGATIVE mg/dL   Hgb urine dipstick TRACE (A) NEGATIVE   Bilirubin Urine NEGATIVE NEGATIVE   Ketones, ur NEGATIVE NEGATIVE mg/dL   Protein, ur NEGATIVE NEGATIVE mg/dL   Urobilinogen, UA 0.2 0.0 - 1.0 mg/dL   Nitrite NEGATIVE NEGATIVE   Leukocytes, UA NEGATIVE NEGATIVE  Urine microscopic-add on  Result Value Ref Range   Squamous Epithelial / LPF RARE RARE   WBC, UA 0-2 <3 WBC/hpf   RBC / HPF 0-2 <3 RBC/hpf   Bacteria, UA RARE RARE     Imaging Review Dg Chest 2 View  04/02/2015  CLINICAL DATA:  Altered mental status EXAM: CHEST - 2 VIEW COMPARISON:  06/03/2014 FINDINGS: Cardiac shadow is within normal limits. Aortic calcifications are again seen. The lungs are again well aerated without focal infiltrate or sizable effusion. Postsurgical changes in the axilla bilaterally are noted. IMPRESSION: No acute abnormality is noted. Electronically Signed   By: Inez Catalina M.D.   On:  04/02/2015 15:36   Ct Head Wo Contrast  04/02/2015  CLINICAL DATA:  Altered mental status EXAM: CT HEAD WITHOUT CONTRAST TECHNIQUE: Contiguous axial images were obtained from the base of the skull through the vertex without intravenous contrast. COMPARISON:  11/13/2014 FINDINGS: There is no evidence of mass effect, midline shift, or extra-axial fluid collections. There is no evidence of  a space-occupying lesion or intracranial hemorrhage. There is no evidence of a cortical-based area of acute infarction. There is a small subacute versus chronic lacunar infarct of the right thalamus. There is generalized cerebral atrophy. There is periventricular white matter low attenuation likely secondary to microangiopathy. The ventricles and sulci are appropriate for the patient's age. The basal cisterns are patent. Visualized portions of the orbits are unremarkable. The visualized portions of the paranasal sinuses and mastoid air cells are unremarkable. Cerebrovascular atherosclerotic calcifications are noted. The osseous structures are unremarkable. IMPRESSION: 1. Small subacute versus chronic lacunar infarct of the right thalamus. Electronically Signed   By: Kathreen Devoid   On: 04/02/2015 15:48   Mr Brain Wo Contrast  04/02/2015  CLINICAL DATA:  Confused. Unable to relate history other than malaise. Altered mental status. Dementia. EXAM: MRI HEAD WITHOUT CONTRAST TECHNIQUE: Multiplanar, multiecho pulse sequences of the brain and surrounding structures were obtained without intravenous contrast. COMPARISON:  CT head 04/02/2015.  MR head 12/03/2006. FINDINGS: No evidence for acute infarction, hemorrhage, mass lesion, hydrocephalus, or extra-axial fluid. Generalized cerebral and cerebellar atrophy. Extensive periventricular and subcortical T2 and FLAIR hyperintensities, also affecting the brainstem and deep nuclei, consistent with advanced chronic microvascular ischemic change. Remote RIGHT inferior frontal infarct or contusion with brain substance loss, stable from priors. Prominent perivascular spaces suggest chronic hypertension. Marked pannus extends cephalad from the posterior aspect of the odontoid, indenting the ventral medulla on the RIGHT. This appearance was present on prior cervical MR of 2014. Moderate cord flattening, greater on the RIGHT at the cervical medullary junction level. Flow voids are  preserved in the carotid, basilar, and LEFT greater than RIGHT vertebral arteries. There is no chronic hemorrhage. Calvarium is intact. Susceptibility related to previous cervical fusion, incompletely evaluated. Extracranial soft tissues unremarkable. Compared with 2008, atrophy and small vessel disease have progressed. Previous cervical fusion, incompletely evaluated. IMPRESSION: No acute intracranial findings. Progression of atrophy and small vessel disease since 2008. Marked pannus with cervicomedullary compression, and intracranial extension resulting in ventral medullary indentation similar to priors. Electronically Signed   By: Staci Righter M.D.   On: 04/02/2015 17:17   I have personally reviewed and evaluated these images and lab results as part of my medical decision-making.   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 Extensive workup here without any significant findings. CT of the head raise concerns for his subacute infarct MRI of the brain ruled that out. Patient improved here some with Ativan. Patient is followed by neurology in [place]. Recommend follow-up with them. Patient will be sent home with a short course of Ativan.  Patient very cooperative here well behaved patient stable for discharge home.  Fredia Sorrow, MD 04/02/15 1752
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
## 2015-06-16 ENCOUNTER — Other Ambulatory Visit: Payer: Self-pay | Admitting: Neurology

## 2015-06-16 ENCOUNTER — Other Ambulatory Visit: Payer: Self-pay | Admitting: Cardiology

## 2015-06-16 NOTE — Telephone Encounter (Signed)
Last OV: 01/22/15 Next OV: 07/25/15

## 2015-07-06 ENCOUNTER — Emergency Department (HOSPITAL_COMMUNITY)
Admission: EM | Admit: 2015-07-06 | Discharge: 2015-07-06 | Disposition: A | Discharge status: Home / Self Care | Payer: Medicare Other | Attending: Emergency Medicine | Admitting: Emergency Medicine

## 2015-07-06 ENCOUNTER — Encounter (HOSPITAL_COMMUNITY): Payer: Self-pay | Admitting: *Deleted

## 2015-07-06 DIAGNOSIS — N390 Urinary tract infection, site not specified: Secondary | ICD-10-CM | POA: Insufficient documentation

## 2015-07-06 DIAGNOSIS — I2510 Atherosclerotic heart disease of native coronary artery without angina pectoris: Secondary | ICD-10-CM | POA: Diagnosis not present

## 2015-07-06 DIAGNOSIS — I4891 Unspecified atrial fibrillation: Secondary | ICD-10-CM | POA: Insufficient documentation

## 2015-07-06 DIAGNOSIS — Z85828 Personal history of other malignant neoplasm of skin: Secondary | ICD-10-CM | POA: Insufficient documentation

## 2015-07-06 DIAGNOSIS — I10 Essential (primary) hypertension: Secondary | ICD-10-CM | POA: Insufficient documentation

## 2015-07-06 DIAGNOSIS — R32 Unspecified urinary incontinence: Secondary | ICD-10-CM | POA: Diagnosis present

## 2015-07-06 DIAGNOSIS — E7800 Pure hypercholesterolemia, unspecified: Secondary | ICD-10-CM | POA: Diagnosis not present

## 2015-07-06 DIAGNOSIS — Z8673 Personal history of transient ischemic attack (TIA), and cerebral infarction without residual deficits: Secondary | ICD-10-CM | POA: Diagnosis not present

## 2015-07-06 DIAGNOSIS — M1990 Unspecified osteoarthritis, unspecified site: Secondary | ICD-10-CM | POA: Diagnosis not present

## 2015-07-06 DIAGNOSIS — Z87891 Personal history of nicotine dependence: Secondary | ICD-10-CM | POA: Diagnosis not present

## 2015-07-06 DIAGNOSIS — Z79899 Other long term (current) drug therapy: Secondary | ICD-10-CM | POA: Diagnosis not present

## 2015-07-06 DIAGNOSIS — I509 Heart failure, unspecified: Secondary | ICD-10-CM | POA: Diagnosis not present

## 2015-07-06 DIAGNOSIS — Z8572 Personal history of non-Hodgkin lymphomas: Secondary | ICD-10-CM | POA: Diagnosis not present

## 2015-07-06 DIAGNOSIS — F0390 Unspecified dementia without behavioral disturbance: Secondary | ICD-10-CM | POA: Diagnosis not present

## 2015-07-06 DIAGNOSIS — Z7982 Long term (current) use of aspirin: Secondary | ICD-10-CM | POA: Insufficient documentation

## 2015-07-06 DIAGNOSIS — J449 Chronic obstructive pulmonary disease, unspecified: Secondary | ICD-10-CM | POA: Insufficient documentation

## 2015-07-06 DIAGNOSIS — Z7901 Long term (current) use of anticoagulants: Secondary | ICD-10-CM | POA: Insufficient documentation

## 2015-07-06 LAB — CBC
HEMATOCRIT: 40 % (ref 39.0–52.0)
Hemoglobin: 13.4 g/dL (ref 13.0–17.0)
MCH: 32.6 pg (ref 26.0–34.0)
MCHC: 33.5 g/dL (ref 30.0–36.0)
MCV: 97.3 fL (ref 78.0–100.0)
PLATELETS: 225 10*3/uL (ref 150–400)
RBC: 4.11 MIL/uL — ABNORMAL LOW (ref 4.22–5.81)
RDW: 13.4 % (ref 11.5–15.5)
WBC: 18.1 10*3/uL — AB (ref 4.0–10.5)

## 2015-07-06 LAB — URINE MICROSCOPIC-ADD ON

## 2015-07-06 LAB — COMPREHENSIVE METABOLIC PANEL
ALT: 11 U/L — ABNORMAL LOW (ref 17–63)
AST: 16 U/L (ref 15–41)
Albumin: 3.5 g/dL (ref 3.5–5.0)
Alkaline Phosphatase: 91 U/L (ref 38–126)
Anion gap: 7 (ref 5–15)
BILIRUBIN TOTAL: 0.8 mg/dL (ref 0.3–1.2)
BUN: 12 mg/dL (ref 6–20)
CHLORIDE: 104 mmol/L (ref 101–111)
CO2: 27 mmol/L (ref 22–32)
CREATININE: 1.11 mg/dL (ref 0.61–1.24)
Calcium: 8.6 mg/dL — ABNORMAL LOW (ref 8.9–10.3)
GFR, EST NON AFRICAN AMERICAN: 60 mL/min — AB (ref >60)
Glucose, Bld: 145 mg/dL — ABNORMAL HIGH (ref 65–99)
POTASSIUM: 3.8 mmol/L (ref 3.5–5.1)
Sodium: 138 mmol/L (ref 135–145)
TOTAL PROTEIN: 7.5 g/dL (ref 6.5–8.1)

## 2015-07-06 LAB — URINALYSIS, ROUTINE W REFLEX MICROSCOPIC
Glucose, UA: NEGATIVE mg/dL
Leukocytes, UA: NEGATIVE
NITRITE: NEGATIVE
PROTEIN: 30 mg/dL — AB
Specific Gravity, Urine: 1.015 (ref 1.005–1.030)
pH: 6 (ref 5.0–8.0)

## 2015-07-06 MED ORDER — CEPHALEXIN 500 MG PO CAPS: 500.0000 mg | ORAL_CAPSULE | Freq: Four times a day (QID) | ORAL | Status: DC

## 2015-07-06 MED ORDER — LIDOCAINE HCL (PF) 1 % IJ SOLN
INTRAMUSCULAR | Status: AC
  Filled 2015-07-06: qty 5

## 2015-07-06 MED ORDER — CEFTRIAXONE SODIUM 1 G IJ SOLR
1.0000 g | Freq: Once | INTRAMUSCULAR | Status: AC
  Administered 2015-07-06: 1 g via INTRAMUSCULAR
  Filled 2015-07-06: qty 10

## 2015-07-06 NOTE — Discharge Instructions (Signed)
It was our pleasure to provide your ER care today - we hope that you feel better.  Rest. Drink plenty of fluids. Eat balance diet.  Take antibiotic (keflex) as prescribed.  Follow up with primary care doctor in the next few days.  A urine culture was sent today, the results of which will be back in 2-3 days - have your doctor follow up on the urine culture results then.   Return to ER if worse, new symptoms, fevers, persistent vomiting, weak/fainting, trouble breathing, other concern.    Urinary Tract Infection Urinary tract infections (UTIs) can develop anywhere along your urinary tract. Your urinary tract is your body's drainage system for removing wastes and extra water. Your urinary tract includes two kidneys, two ureters, a bladder, and a urethra. Your kidneys are a pair of bean-shaped organs. Each kidney is about the size of your fist. They are located below your ribs, one on each side of your spine. CAUSES Infections are caused by microbes, which are microscopic organisms, including fungi, viruses, and bacteria. These organisms are so small that they can only be seen through a microscope. Bacteria are the microbes that most commonly cause UTIs. SYMPTOMS  Symptoms of UTIs may vary by age and gender of the patient and by the location of the infection. Symptoms in young women typically include a frequent and intense urge to urinate and a painful, burning feeling in the bladder or urethra during urination. Older women and men are more likely to be tired, shaky, and weak and have muscle aches and abdominal pain. A fever may mean the infection is in your kidneys. Other symptoms of a kidney infection include pain in your back or sides below the ribs, nausea, and vomiting. DIAGNOSIS To diagnose a UTI, your caregiver will ask you about your symptoms. Your caregiver will also ask you to provide a urine sample. The urine sample will be tested for bacteria and white blood cells. White blood cells are  made by your body to help fight infection. TREATMENT  Typically, UTIs can be treated with medication. Because most UTIs are caused by a bacterial infection, they usually can be treated with the use of antibiotics. The choice of antibiotic and length of treatment depend on your symptoms and the type of bacteria causing your infection. HOME CARE INSTRUCTIONS  If you were prescribed antibiotics, take them exactly as your caregiver instructs you. Finish the medication even if you feel better after you have only taken some of the medication.  Drink enough water and fluids to keep your urine clear or pale yellow.  Avoid caffeine, tea, and carbonated beverages. They tend to irritate your bladder.  Empty your bladder often. Avoid holding urine for long periods of time.  Empty your bladder before and after sexual intercourse.  After a bowel movement, women should cleanse from front to back. Use each tissue only once. SEEK MEDICAL CARE IF:   You have back pain.  You develop a fever.  Your symptoms do not begin to resolve within 3 days. SEEK IMMEDIATE MEDICAL CARE IF:   You have severe back pain or lower abdominal pain.  You develop chills.  You have nausea or vomiting.  You have continued burning or discomfort with urination. MAKE SURE YOU:   Understand these instructions.  Will watch your condition.  Will get help right away if you are not doing well or get worse.   This information is not intended to replace advice given to you by your health care  provider. Make sure you discuss any questions you have with your health care provider.   Document Released: 03/08/2005 Document Revised: 02/17/2015 Document Reviewed: 07/07/2011 Elsevier Interactive Patient Education [employer].

## 2015-07-06 NOTE — ED Notes (Signed)
Per family, pt attempted to use urinal, but unable to urinate. States he feels pressure, but cannot urinate.

## 2015-07-06 NOTE — ED Notes (Signed)
MD Ashok Cordia at bedside updating patient and family.

## 2015-07-06 NOTE — ED Notes (Signed)
Pt drinking water at this time. Pt tolerating well.

## 2015-07-06 NOTE — ED Notes (Signed)
Pt redirected back into room. Pt continuously coming into hallway requesting water. Pt making statements such as "I am about to leave out that damn front door."

## 2015-07-06 NOTE — ED Notes (Signed)
Pt given water to drink with MD Ashok Cordia approval.

## 2015-07-06 NOTE — ED Notes (Signed)
Pt and family made aware a urine specimen is needed. Pt given urinal. Verbalized understanding and will notify staff when one can be obtained.

## 2015-07-06 NOTE — ED Provider Notes (Signed)
CSN: [id number]     Arrival date & time 07/06/15  1210 History   First MD Initiated Contact with Patient 07/06/15 1243     Chief Complaint  Patient presents with  . Urinary Incontinence     (Consider location/radiation/quality/duration/timing/severity/associated sxs/prior Treatment) The history is provided by the patient and a caregiver.  Patient indicates he feels fine and nothing is wrong.  Caregiver had noted urinary incontinence in the past couple days.  Pt indicates he has had that in the past, and recently, and is unsure of more now than before.  No stool incontinence.  Pt denies dysuria, urgency or frequency. Denies penile pain, no scrotal or testicular pain or swelling.  No back pain. No saddle area or leg numbness. No weakness. No fever or chills. No abd pain. No nv.        Past Medical History  Diagnosis Date  . Arteriosclerotic cardiovascular disease (ASCVD)     BMS to RCA 1997; cutting ballon for RCA restenosis in 2001; cath 12/10 - 100% RCA L->R collaterals; nl EF; 40% LAD and OM1  . Hypertension   . Hyperlipidemia   . Cerebrovascular disease     bilateral bruits; duplex in 2009 - mild plaque w/o stenosis  . Peripheral vascular disease (HCC)     ABI on 0.87 on left  . Tobacco abuse, in remission     60 pack years; discontinued in 2003  . COPD (chronic obstructive pulmonary disease) ([place])   . Fasting hyperglycemia   . DDD (degenerative disc disease), cervical   . Muscle discomfort     No improvement after statins temporary discontinued  . Hypercholesteremia   . Dementia   . Atrial fibrillation ([place])   . Lymphoma ([place])     chemotherapy and radiation therapy in 2006; recurrence in 2007  . Squamous cell carcinoma [facility])     s/p Mohs surgery  . Cancer ([place])     b cell lymphoma  . Diffuse large B cell lymphoma ([place]) 03/18/2010  . CHF (congestive heart failure) [facility])    Past Surgical History  Procedure Laterality Date  . Anterior fusion cervical spine  2003     posterior?  . Inguinal hernia repair  1996    Right  . Orif femur fracture  1996  . Mohs surgery    . Colonoscopy  05/16/2011    Procedure: COLONOSCOPY;  Surgeon: Jamesetta So; diverticulosis, sigmoid polypectomy  . Port-a-cath removal Right 03/10/2013    Procedure: REMOVAL PORT-A-CATH;  Surgeon: Scherry Ran, MD;  Location: [facility];  Service: General;  Laterality: Right;  . Pyloric stenosis surgery      infant   Family History  Problem Relation Age of Onset  . Stroke Father     deceased - 27  . Stroke Mother     deceased - 24   . Heart attack Brother 66  . Heart attack Sister   . Colon cancer Neg Hx    Social History  Substance Use Topics  . Smoking status: Former Smoker -- 1.00 packs/day for 50 years    Types: Cigarettes    Start date: 09/19/1945    Quit date: 06/12/1996  . Smokeless tobacco: Never Used  . Alcohol Use: No    Review of Systems  Constitutional: Negative for fever and chills.  HENT: Negative for sore throat.   Eyes: Negative for redness.  Respiratory: Negative for shortness of breath.   Cardiovascular: Negative for chest pain.  Gastrointestinal: Negative for vomiting, abdominal pain  and diarrhea.  Genitourinary: Negative for dysuria, flank pain and discharge.  Musculoskeletal: Negative for back pain and neck pain.  Skin: Negative for rash.  Neurological: Negative for headaches.  Hematological: Does not bruise/bleed easily.  Psychiatric/Behavioral: Negative for confusion.      Allergies  Levaquin  Home Medications   Prior to Admission medications   Medication Sig Start Date End Date Taking? Authorizing Provider  acetaminophen (TYLENOL) 325 MG tablet Take 650 mg by mouth daily as needed for mild pain or moderate pain.    Historical Provider, MD  apixaban (ELIQUIS) 5 MG TABS tablet Take 1 tablet (5 mg total) by mouth 2 (two) times daily. 01/14/15   Arnoldo Lenis, MD  aspirin EC 81 MG tablet Take 1 tablet (81 mg total) by mouth as needed  for mild pain. 01/14/15   Arnoldo Lenis, MD  atorvastatin (LIPITOR) 40 MG tablet TAKE 1 TABLET BY MOUTH EVERY DAY 02/16/15   Arnoldo Lenis, MD  donepezil (ARICEPT) 10 MG tablet Take 10 mg by mouth at bedtime.    Historical Provider, MD  ENSURE (ENSURE) Take 237 mLs by mouth daily as needed (FOR NUTRITIONAL SUPPLEMENT).    Historical Provider, MD  fluticasone (FLONASE) 50 MCG/ACT nasal spray Place 2 sprays into both nostrils daily as needed for allergies or rhinitis.  09/22/14   Historical Provider, MD  furosemide (LASIX) 40 MG tablet TAKE 1 TABLET BY MOUTH 2 TIMES DAILY. 06/16/15   Arnoldo Lenis, MD  lisinopril (PRINIVIL,ZESTRIL) 40 MG tablet Take 1 tablet (40 mg total) by mouth daily. 08/19/14   Arnoldo Lenis, MD  LORazepam (ATIVAN) 1 MG tablet Take 1 tablet (1 mg total) by mouth 3 (three) times daily as needed for anxiety. 04/02/15   Fredia Sorrow, MD  metoprolol succinate (TOPROL-XL) 100 MG 24 hr tablet Take 50 mg by mouth every morning.  05/25/14   Historical Provider, MD  potassium chloride SA (K-DUR,KLOR-CON) 20 MEQ tablet Take 1 tablet (20 mEq total) by mouth daily. Patient taking differently: Take 10 mEq by mouth 2 (two) times daily.  01/14/15   Arnoldo Lenis, MD  sertraline (ZOLOFT) 25 MG tablet Take 1 tablet (25 mg total) by mouth daily. 06/16/15   Pieter Partridge, DO   BP 140/72 mmHg  Pulse 93  Temp(Src) 97.8 F (36.6 C) (Oral)  Resp 20  Ht 5\' 7"  (1.702 m)  Wt 77.111 kg  BMI 26.62 kg/m2  SpO2 100% Physical Exam  Constitutional: He is oriented to person, place, and time. He appears well-developed and well-nourished. No distress.  HENT:  Mouth/Throat: Oropharynx is clear and moist.  Eyes: Conjunctivae are normal. No scleral icterus.  Neck: Neck supple. No tracheal deviation present.  Cardiovascular: Normal rate, regular rhythm, normal heart sounds and intact distal pulses.   Pulmonary/Chest: Effort normal and breath sounds normal. No accessory muscle usage. No respiratory  distress.  Abdominal: Soft. Bowel sounds are normal. He exhibits no distension and no mass. There is no tenderness. There is no rebound and no guarding.  Genitourinary:  Normal ext genitalia. No scrotal or testicular pain, swelling or tenderness. No cva tenderness.   Musculoskeletal: Normal range of motion. He exhibits no edema.  Neurological: He is alert and oriented to person, place, and time.  Skin: Skin is warm and dry. No rash noted. He is not diaphoretic.  Psychiatric: He has a normal mood and affect.  Nursing note and vitals reviewed.   ED Course  Procedures (including critical care time)  Labs Review  Results for orders placed or performed during the hospital encounter of 07/06/15  CBC  Result Value Ref Range   WBC 18.1 (H) 4.0 - 10.5 K/uL   RBC 4.11 (L) 4.22 - 5.81 MIL/uL   Hemoglobin 13.4 13.0 - 17.0 g/dL   HCT 40.0 39.0 - 52.0 %   MCV 97.3 78.0 - 100.0 fL   MCH 32.6 26.0 - 34.0 pg   MCHC 33.5 30.0 - 36.0 g/dL   RDW 13.4 11.5 - 15.5 %   Platelets 225 150 - 400 K/uL  Comprehensive metabolic panel  Result Value Ref Range   Sodium 138 135 - 145 mmol/L   Potassium 3.8 3.5 - 5.1 mmol/L   Chloride 104 101 - 111 mmol/L   CO2 27 22 - 32 mmol/L   Glucose, Bld 145 (H) 65 - 99 mg/dL   BUN 12 6 - 20 mg/dL   Creatinine, Ser 1.11 0.61 - 1.24 mg/dL   Calcium 8.6 (L) 8.9 - 10.3 mg/dL   Total Protein 7.5 6.5 - 8.1 g/dL   Albumin 3.5 3.5 - 5.0 g/dL   AST 16 15 - 41 U/L   ALT 11 (L) 17 - 63 U/L   Alkaline Phosphatase 91 38 - 126 U/L   Total Bilirubin 0.8 0.3 - 1.2 mg/dL   GFR calc non Af Amer 60 (L) >60 mL/min   GFR calc Af Amer >60 >60 mL/min   Anion gap 7 5 - 15  Urinalysis, Routine w reflex microscopic (not at [facility])  Result Value Ref Range   Color, Urine YELLOW YELLOW   APPearance CLEAR CLEAR   Specific Gravity, Urine 1.015 1.005 - 1.030   pH 6.0 5.0 - 8.0   Glucose, UA NEGATIVE NEGATIVE mg/dL   Hgb urine dipstick SMALL (A) NEGATIVE   Bilirubin Urine SMALL (A) NEGATIVE    Ketones, ur TRACE (A) NEGATIVE mg/dL   Protein, ur 30 (A) NEGATIVE mg/dL   Nitrite NEGATIVE NEGATIVE   Leukocytes, UA NEGATIVE NEGATIVE  Urine microscopic-add on  Result Value Ref Range   Squamous Epithelial / LPF 0-5 (A) NONE SEEN   WBC, UA 6-30 0 - 5 WBC/hpf   RBC / HPF 0-5 0 - 5 RBC/hpf   Bacteria, UA FEW (A) NONE SEEN       I have personally reviewed and evaluated these lab results as part of my medical decision-making.   MDM   Labs.  Reviewed nursing notes and prior charts for additional history.   UA with bact and 6-30 wbc, will culture and rx.  Wbc is elevated. Pt afeb, bp normal.  No emesis.  Pt requests d/c to home.  Is ambulatory about ED, tolerating fluids, and is adamant that he be d/c to home.  Will give dose rocephin in ED, IM, and d/c w abx.  Pt w family.  rec close pcp f/u.  Return precautions provided.      Lajean Saver, MD 07/06/15 [phone]

## 2015-07-06 NOTE — ED Notes (Signed)
Patient is here with caregiver, states pt. Has been having urinary incontinence since yesterday which is new for him. Also has decreased appetite x 2 days. Patient denies pain.

## 2015-07-06 NOTE — ED Notes (Signed)
MD Steinl at bedside. 

## 2015-07-08 LAB — URINE CULTURE: Culture: NO GROWTH

## 2015-08-04 ENCOUNTER — Ambulatory Visit (INDEPENDENT_AMBULATORY_CARE_PROVIDER_SITE_OTHER): Payer: Medicare Other | Admitting: Cardiology

## 2015-08-04 ENCOUNTER — Encounter: Payer: Self-pay | Admitting: Cardiology

## 2015-08-04 ENCOUNTER — Encounter: Payer: Self-pay | Admitting: *Deleted

## 2015-08-04 VITALS — BP 127/67 | HR 75 | Ht 66.0 in | Wt 158.0 lb

## 2015-08-04 DIAGNOSIS — I2510 Atherosclerotic heart disease of native coronary artery without angina pectoris: Secondary | ICD-10-CM | POA: Diagnosis not present

## 2015-08-04 DIAGNOSIS — I4891 Unspecified atrial fibrillation: Secondary | ICD-10-CM | POA: Diagnosis not present

## 2015-08-04 DIAGNOSIS — I5022 Chronic systolic (congestive) heart failure: Secondary | ICD-10-CM

## 2015-08-04 NOTE — Progress Notes (Addendum)
Patient ID: Curtis Lang, male   DOB: 10-21-1930, 82 y.o.   MRN: [id number]     Clinical Summary [name] is a 82 y.o.male seen today for follow up of the following medical problems.  1. CAD/Chronic systolic HF - prior PCI to RCA in 1997 with cutting balloon to RCA in 2001, last cath 05/2009 with chronically occluded RCA with left to right collaterals and moderate disease of LAD and LCX. LV gram at that time showed LVEF 55% with inferior hypokinesis.  -05/15/13 echo showed new finding of severe LV systolic dysfunction, LVEF 25-30%.  - medical therapy has been limited due to low heart rates, soft bp's at times  - denies any SOB or DOE. No LE edema. He is compliant with meds  2. Afib  - CHADS2Vasc score of 5, he remains on eliquis without any bleeding issues.  - denies any palpitations since last visti  3. Lymphoma  -He is in remission currently, he is followed by oncology  4. Dementia - followed by neurology   5. Hyperlipidemia - compliant with statin  Past Medical History  Diagnosis Date  . Arteriosclerotic cardiovascular disease (ASCVD)     BMS to RCA 1997; cutting ballon for RCA restenosis in 2001; cath 12/10 - 100% RCA L->R collaterals; nl EF; 40% LAD and OM1  . Hypertension   . Hyperlipidemia   . Cerebrovascular disease     bilateral bruits; duplex in 2009 - mild plaque w/o stenosis  . Peripheral vascular disease (HCC)     ABI on 0.87 on left  . Tobacco abuse, in remission     60 pack years; discontinued in 2003  . COPD (chronic obstructive pulmonary disease) ([place])   . Fasting hyperglycemia   . DDD (degenerative disc disease), cervical   . Muscle discomfort     No improvement after statins temporary discontinued  . Hypercholesteremia   . Dementia   . Atrial fibrillation ([place])   . Lymphoma ([place])     chemotherapy and radiation therapy in 2006; recurrence in 2007  . Squamous cell carcinoma [facility])     s/p Mohs surgery  . Cancer ([place])     b cell lymphoma    . Diffuse large B cell lymphoma ([place]) 03/18/2010  . CHF (congestive heart failure) (HCC)      Allergies  Allergen Reactions  . Levaquin [Levofloxacin] Nausea And Vomiting     Current Outpatient Prescriptions  Medication Sig Dispense Refill  . acetaminophen (TYLENOL) 325 MG tablet Take 650 mg by mouth daily as needed for mild pain or moderate pain.    Marland Kitchen apixaban (ELIQUIS) 5 MG TABS tablet Take 1 tablet (5 mg total) by mouth 2 (two) times daily.    Marland Kitchen aspirin EC 81 MG tablet Take 1 tablet (81 mg total) by mouth as needed for mild pain.    Marland Kitchen atorvastatin (LIPITOR) 40 MG tablet TAKE 1 TABLET BY MOUTH EVERY DAY (Patient taking differently: TAKE 20 TABLET BY MOUTH EVERY DAY) 30 tablet 6  . cephALEXin (KEFLEX) 500 MG capsule Take 1 capsule (500 mg total) by mouth 4 (four) times daily. 28 capsule 0  . donepezil (ARICEPT) 10 MG tablet Take 10 mg by mouth at bedtime.    . ENSURE (ENSURE) Take 237 mLs by mouth daily as needed (FOR NUTRITIONAL SUPPLEMENT).    . fluticasone (FLONASE) 50 MCG/ACT nasal spray Place 2 sprays into both nostrils daily.    . furosemide (LASIX) 40 MG tablet TAKE 1 TABLET BY MOUTH 2 TIMES  DAILY. 60 tablet 6  . lisinopril (PRINIVIL,ZESTRIL) 40 MG tablet Take 1 tablet (40 mg total) by mouth daily. 90 tablet 3  . LORazepam (ATIVAN) 0.5 MG tablet Take 0.5 mg by mouth every 8 (eight) hours.    Marland Kitchen LORazepam (ATIVAN) 1 MG tablet Take 1 tablet (1 mg total) by mouth 3 (three) times daily as needed for anxiety. (Patient not taking: Reported on 07/06/2015) 15 tablet 0  . metoprolol succinate (TOPROL-XL) 50 MG 24 hr tablet Take 50 mg by mouth daily. Take with or immediately following a meal.    . potassium chloride SA (K-DUR,KLOR-CON) 20 MEQ tablet Take 1 tablet (20 mEq total) by mouth daily. (Patient taking differently: Take 10 mEq by mouth 2 (two) times daily. ) 30 tablet 11  . sertraline (ZOLOFT) 25 MG tablet Take 1 tablet (25 mg total) by mouth daily. 30 tablet 5   No current  facility-administered medications for this visit.     Past Surgical History  Procedure Laterality Date  . Anterior fusion cervical spine  2003    posterior?  . Inguinal hernia repair  1996    Right  . Orif femur fracture  1996  . Mohs surgery    . Colonoscopy  05/16/2011    Procedure: COLONOSCOPY;  Surgeon: Jamesetta So; diverticulosis, sigmoid polypectomy  . Port-a-cath removal Right 03/10/2013    Procedure: REMOVAL PORT-A-CATH;  Surgeon: Scherry Ran, MD;  Location: [facility];  Service: General;  Laterality: Right;  . Pyloric stenosis surgery      infant     Allergies  Allergen Reactions  . Levaquin [Levofloxacin] Nausea And Vomiting      Family History  Problem Relation Age of Onset  . Stroke Father     deceased - 9  . Stroke Mother     deceased - 35   . Heart attack Brother 4  . Heart attack Sister   . Colon cancer Neg Hx      Social History [name] reports that he quit smoking about 19 years ago. His smoking use included Cigarettes. He started smoking about 69 years ago. He has a 50 pack-year smoking history. He has never used smokeless tobacco. [name] reports that he does not drink alcohol.   Review of Systems CONSTITUTIONAL: No weight loss, fever, chills, weakness or fatigue.  HEENT: Eyes: No visual loss, blurred vision, double vision or yellow sclerae.No hearing loss, sneezing, congestion, runny nose or sore throat.  SKIN: No rash or itching.  CARDIOVASCULAR: per HPI RESPIRATORY: No shortness of breath, cough or sputum.  GASTROINTESTINAL: No anorexia, nausea, vomiting or diarrhea. No abdominal pain or blood.  GENITOURINARY: No burning on urination, no polyuria NEUROLOGICAL: No headache, dizziness, syncope, paralysis, ataxia, numbness or tingling in the extremities. No change in bowel or bladder control.  MUSCULOSKELETAL: No muscle, back pain, joint pain or stiffness.  LYMPHATICS: No enlarged nodes. No history of splenectomy.  PSYCHIATRIC: No  history of depression or anxiety.  ENDOCRINOLOGIC: No reports of sweating, cold or heat intolerance. No polyuria or polydipsia.  Marland Kitchen   Physical Examination Filed Vitals:   08/04/15 1255  BP: 127/67  Pulse: 75   Filed Vitals:   08/04/15 1255  Height: 5\' 6"  (1.676 m)  Weight: 158 lb (71.668 kg)    Gen: resting comfortably, no acute distress HEENT: no scleral icterus, pupils equal round and reactive, no palptable cervical adenopathy,  CV: RRR, no m/r/g, no jvd Resp: Clear to auscultation bilaterally GI: abdomen is soft, non-tender,  non-distended, normal bowel sounds, no hepatosplenomegaly MSK: extremities are warm, no edema.  Skin: warm, no rash Neuro:  no focal deficits Psych: appropriate affect   Diagnostic Studies 05/16/13 Echo  LVEF 25-30%, mild LVH, increased LA pressure, mild MR,mild RV dysfunction   08/04/15 Clinic EKG:(performed and reviewed in clinic): afib, LBBB Assessment and Plan   1. CAD/Systolic dysfunction  - no current symptoms -. We have not pursued aggressive invasive testing or ICD consideration due to his severe dementia and mulitple comorbidities, currently managing medically.  - did not tolerate higher Toprol doses. Have not started aldactone as patient has severe demenita, and daughter working hard to managed an already complicated regiment, though over follow ups they have demonstrated a stronger comfort with his regimen, may consider changes next visit. We will repeat echo to see if further titration is indicated.  - we will continue his current regimen  2. Afib  - continue rate control,  - CHADS2Vascscore of 5, continue eliquis  3. Lymphoma  - continue to follow with onc.  4. Dementia - continue to follow with neuro  F/u 6 months     Arnoldo Lenis, M.D

## 2015-08-04 NOTE — Patient Instructions (Signed)

## 2015-08-11 ENCOUNTER — Emergency Department (HOSPITAL_COMMUNITY)
Admission: EM | Admit: 2015-08-11 | Discharge: 2015-08-11 | Disposition: A | Discharge status: Home / Self Care | Payer: Medicare Other | Attending: Emergency Medicine | Admitting: Emergency Medicine

## 2015-08-11 ENCOUNTER — Encounter (HOSPITAL_COMMUNITY): Payer: Self-pay | Admitting: Emergency Medicine

## 2015-08-11 DIAGNOSIS — I110 Hypertensive heart disease with heart failure: Secondary | ICD-10-CM | POA: Insufficient documentation

## 2015-08-11 DIAGNOSIS — Z87891 Personal history of nicotine dependence: Secondary | ICD-10-CM | POA: Diagnosis not present

## 2015-08-11 DIAGNOSIS — I739 Peripheral vascular disease, unspecified: Secondary | ICD-10-CM | POA: Insufficient documentation

## 2015-08-11 DIAGNOSIS — N390 Urinary tract infection, site not specified: Secondary | ICD-10-CM | POA: Diagnosis present

## 2015-08-11 DIAGNOSIS — I679 Cerebrovascular disease, unspecified: Secondary | ICD-10-CM | POA: Diagnosis not present

## 2015-08-11 DIAGNOSIS — Z79899 Other long term (current) drug therapy: Secondary | ICD-10-CM | POA: Diagnosis not present

## 2015-08-11 DIAGNOSIS — J449 Chronic obstructive pulmonary disease, unspecified: Secondary | ICD-10-CM | POA: Insufficient documentation

## 2015-08-11 DIAGNOSIS — E785 Hyperlipidemia, unspecified: Secondary | ICD-10-CM | POA: Diagnosis not present

## 2015-08-11 DIAGNOSIS — Z8572 Personal history of non-Hodgkin lymphomas: Secondary | ICD-10-CM | POA: Diagnosis not present

## 2015-08-11 DIAGNOSIS — E860 Dehydration: Secondary | ICD-10-CM | POA: Diagnosis not present

## 2015-08-11 DIAGNOSIS — I509 Heart failure, unspecified: Secondary | ICD-10-CM | POA: Diagnosis not present

## 2015-08-11 DIAGNOSIS — I2510 Atherosclerotic heart disease of native coronary artery without angina pectoris: Secondary | ICD-10-CM | POA: Diagnosis not present

## 2015-08-11 LAB — BASIC METABOLIC PANEL
ANION GAP: 11 (ref 5–15)
BUN: 13 mg/dL (ref 6–20)
CHLORIDE: 100 mmol/L — AB (ref 101–111)
CO2: 29 mmol/L (ref 22–32)
Calcium: 8.9 mg/dL (ref 8.9–10.3)
Creatinine, Ser: 1.25 mg/dL — ABNORMAL HIGH (ref 0.61–1.24)
GFR, EST NON AFRICAN AMERICAN: 52 mL/min — AB (ref >60)
Glucose, Bld: 100 mg/dL — ABNORMAL HIGH (ref 65–99)
POTASSIUM: 4.1 mmol/L (ref 3.5–5.1)
SODIUM: 140 mmol/L (ref 135–145)

## 2015-08-11 LAB — CBC WITH DIFFERENTIAL/PLATELET
BASOS ABS: 0.1 10*3/uL (ref 0.0–0.1)
Basophils Relative: 1 %
EOS ABS: 0.3 10*3/uL (ref 0.0–0.7)
Eosinophils Relative: 3 %
HCT: 39.2 % (ref 39.0–52.0)
HEMOGLOBIN: 13.2 g/dL (ref 13.0–17.0)
LYMPHS ABS: 2.1 10*3/uL (ref 0.7–4.0)
Lymphocytes Relative: 22 %
MCH: 32.8 pg (ref 26.0–34.0)
MCHC: 33.7 g/dL (ref 30.0–36.0)
MCV: 97.3 fL (ref 78.0–100.0)
Monocytes Absolute: 0.6 10*3/uL (ref 0.1–1.0)
Monocytes Relative: 6 %
NEUTROS PCT: 68 %
Neutro Abs: 6.6 10*3/uL (ref 1.7–7.7)
PLATELETS: 270 10*3/uL (ref 150–400)
RBC: 4.03 MIL/uL — AB (ref 4.22–5.81)
RDW: 14 % (ref 11.5–15.5)
WBC: 9.6 10*3/uL (ref 4.0–10.5)

## 2015-08-11 LAB — URINALYSIS, ROUTINE W REFLEX MICROSCOPIC
Glucose, UA: NEGATIVE mg/dL
Hgb urine dipstick: NEGATIVE
Ketones, ur: NEGATIVE mg/dL
Leukocytes, UA: NEGATIVE
NITRITE: NEGATIVE
PROTEIN: NEGATIVE mg/dL
SPECIFIC GRAVITY, URINE: 1.02 (ref 1.005–1.030)
pH: 6 (ref 5.0–8.0)

## 2015-08-11 LAB — RAPID URINE DRUG SCREEN, HOSP PERFORMED
AMPHETAMINES: NOT DETECTED
BENZODIAZEPINES: POSITIVE — AB
Barbiturates: NOT DETECTED
Cocaine: NOT DETECTED
Opiates: NOT DETECTED
TETRAHYDROCANNABINOL: NOT DETECTED

## 2015-08-11 MED ORDER — ACETAMINOPHEN 325 MG PO TABS
650.0000 mg | ORAL_TABLET | Freq: Once | ORAL | Status: AC
  Administered 2015-08-11: 650 mg via ORAL
  Filled 2015-08-11: qty 2

## 2015-08-11 NOTE — ED Notes (Signed)
[employer] controller here to talk with daughter about resources.

## 2015-08-11 NOTE — Discharge Instructions (Signed)
Dehydration Dehydration means your body does not have as much fluid as it needs. Your kidneys, brain, and heart will not work properly without the right amount of fluids and salt. Older adults are more likely to become dehydrated than younger adults. This is because:   Their bodies do not hold water as well.  Their bodies do not respond to temperature changes as well.  They do not get thirsty as easily or as quickly. HOME CARE  Ask your doctor how to replace body fluid losses (rehydrate).  Drink enough fluids to keep your pee (urine) clear or pale yellow.  Drink small amounts of fluids often if you feel sick to your stomach (nauseous) or throw up (vomit).  Eat like you normally do.  Avoid:  Foods or drinks high in sugar.  Bubbly (carbonated) drinks.  Juice.  Very hot or cold fluids.  Drinks with caffeine.  Fatty, greasy foods.  Alcohol.  Tobacco.  Eating too much.  Gelatin desserts.  Wash your hands to avoid spreading germs (bacteria, viruses).  Only take medicine as told by your doctor.  Keep all doctor visits as told. GET HELP IF:  You have belly (abdominal) pain that gets worse or stays in one spot (localizes).  You have a rash, stiff neck, or bad headache.  You get easily annoyed, sleepy, or are hard to wake up.  You feel weak, dizzy, or very thirsty.  You have a fever. GET HELP RIGHT AWAY IF:   You cannot drink fluid without throwing up.  You get worse even with treatment.  You throw up often.  You have watery poop (diarrhea) often.  Your vomit has blood in it or looks greenish.  Your poop (stool) has blood in it or looks black and tarry.  You have not peed in 6 to 8 hours or have only peed a small amount of very dark pee.  You pass out (faint). MAKE SURE YOU:   Understand these instructions.  Will watch your condition.  Will get help right away if you are not doing well or get worse.   This information is not intended to replace  advice given to you by your health care provider. Make sure you discuss any questions you have with your health care provider.   Document Released: 05/18/2011 Document Revised: 06/03/2013 Document Reviewed: 02/03/2013 Elsevier Interactive Patient Education 2016 [employer].    Emergency Department Resource Guide 1) Find a Doctor and Pay Out of Pocket Although you won't have to find out who is covered by your insurance plan, it is a good idea to ask around and get recommendations. You will then need to call the office and see if the doctor you have chosen will accept you as a new patient and what types of options they offer for patients who are self-pay. Some doctors offer discounts or will set up payment plans for their patients who do not have insurance, but you will need to ask so you aren't surprised when you get to your appointment.  2) Contact Your Local Health Department Not all health departments have doctors that can see patients for sick visits, but many do, so it is worth a call to see if yours does. If you don't know where your local health department is, you can check in your phone book. The CDC also has a tool to help you locate your state's health department, and many state websites also have listings of all of their local health departments.  3) Find a  Walk-in Clinic If your illness is not likely to be very severe or complicated, you may want to try a walk in clinic. These are popping up all over the country in pharmacies, drugstores, and shopping centers. They're usually staffed by nurse practitioners or physician assistants that have been trained to treat common illnesses and complaints. They're usually fairly quick and inexpensive. However, if you have serious medical issues or chronic medical problems, these are probably not your best option.  No Primary Care Doctor: - Call Health Connect at  [phone] - they can help you locate a primary care doctor that  accepts your  insurance, provides certain services, etc. - Physician Referral Service- [phone]  Chronic Pain Problems: Organization         Address  Phone   Notes  [facility]  [phone] Patients need to be referred by their primary care doctor.   Medication Assistance: Organization         Address  Phone   Notes  [facility] Medication [facility] [address] [place], [place] [address] [phone] --Must be a resident of [facility] -- Must have NO insurance coverage whatsoever (no Medicaid/ Medicare, etc.) -- The pt. MUST have a primary care doctor that directs their care regularly and follows them in the community   MedAssist  [phone]   [employer]  [phone]    Agencies that provide inexpensive medical care: Organization         Address  Phone   Notes  [place]  [phone]   [facility]    [phone]   [facility] [place], [place] [address] [phone]   [place] [address] [place] [phone]   Planned Parenthood    [phone]   [facility]    [phone]   [place] and [place] [address], [place] Phone:  [phone], Fax:  [phone] Hours of Operation:  9 am - 6 pm, M-F.  Also accepts Medicaid/Medicare and self-pay.  [facility] for [place], [address], [place] Phone: [phone], Fax: [phone]. Hours of Operation:  8:30 am - 5:30 pm, M-F.  Also accepts Medicaid and self-pay.  [facility] [address], [place] Phone: [phone]   [place], [place], [place] [phone], Ext. [phone] Mondays & Thursdays: 7-9 AM.  First 15 patients are seen on a first come, first serve basis.    [place] Providers:  Organization          Address  Phone   Notes  [facility] [address], [place] [phone] Also accepts self-pay patients.  [facility] [id number] [place], [place]  [phone]   [place], [address], [place] [phone]   [facility] [address] [place] [phone]   Lucianne Lei [address], [place]   [phone] Only accepts [place] Access [place] patients after they have their name applied to their card.   Self-Pay (no insurance) in [facility]:  Organization         Address  Phone   Notes  Sickle Cell Patients, [facility] [place],  [place] [phone]   [facility] Urgent Care [place] [phone]   [facility] [place]  [place], [place], [place] [phone]   [facility]/Dr. Osei-Bonsu  [address] [place] or [address], [place] [phone] Phone number for both [place] and [place] locations is the same.  Urgent Medical and [facility] [address] [place] [phone]   [facility] [address], [place] or [address] [phone]   [facility] [address], [place] [phone], phone; [phone], fax Sees patients 1st and 3rd Saturday of every month.  Must not qualify for public or private insurance (i.e. Medicaid, Medicare, [place] Health Choice, Veterans' Benefits)  Household income should be no more than 200% of the poverty level The clinic cannot treat you if you are pregnant or think you are pregnant  Sexually transmitted diseases are not treated at the clinic.    Dental Care: Organization         Address  Phone  Notes  [facility] Department of [facility] [place] [phone] Accepts children up to age 49 who are enrolled in [place] or [place]; pregnant women with a Medicaid card; and children who have applied for Medicaid or [place] Health Choice, but were declined, whose parents can pay a reduced fee at time of service.  [facility] of [facility]  [address], [place] [phone] Accepts children up to age 68 who are enrolled in [place] or [place]; pregnant women with a Medicaid card; and children who have applied for Medicaid or [place] Health Choice, but were declined, whose parents can pay a reduced fee at time of service.  [facility] PRO[facility]  [place] [phone] Patients are seen by appointment only. Walk-ins are not accepted. [place] will see patients 64 years of age and older. Monday - Tuesday (8am-5pm) Most Wednesdays (8:30-5pm) $30 per visit, cash only  [facility] Adult Dental Access PROGRAM  [address], [facility] [phone] Patients are seen by appointment only. Walk-ins are not accepted. [place] will see patients 14 years of age and older. One Wednesday Evening (Monthly: Volunteer Based).  $30 per visit, cash only  [place]  [phone] for adults; Children under age 32, call Graduate Pediatric Dentistry at [phone]. Children aged 32-14, please call [phone] to request a pediatric application.  Dental services are provided in all areas of dental care including fillings, crowns and bridges, complete and partial dentures, implants, gum treatment, root canals, and extractions. Preventive care is also provided. Treatment is provided to both adults and children. Patients are selected via a lottery and there is often a waiting list.   [facility] [address], [place]  [phone] [email or web address]   [facility] [address] [place], [place]  [phone], Ext. [phone] Second and Fourth Thursday of each month, opens at 6:30 AM; Clinic ends at 9 AM.  Patients are seen on a first-come first-served basis, and a limited number are seen during each clinic.   [facility]  [address] Hillard Danker [place], [place] [phone]   Eligibility Requirements You must have lived in [place], [place], or [place]  counties for at least the last three months.   You cannot be eligible for state or federal sponsored [employer], including [employer], [place], or [employer].   You generally cannot be eligible for healthcare insurance through your employer.    How to apply: Eligibility screenings are held every Tuesday and Wednesday afternoon from 1:00 pm until 4:00 pm. You do not need an appointment for the interview!  [facility] [address], [place], [place]   [place]  Thor Department  [place]  [phone]    Behavioral Health Resources in the Community: Intensive Outpatient Programs Organization         Address  Phone  Notes  Bayard Woolsey. [address], [place], [place] [phone]   [facility] Outpatient [address] [place], [place]   ADS: Alcohol & Drug Svcs [address], [place], [place]   [place] [address]  [place], [place] or [phone]   Substance Abuse Resources Organization         Address  Phone  Notes  Alcohol and Drug Services  [phone]   [place]  [phone]   [facility] [place]   Chinita Pester  [phone]   Residential & Outpatient Substance Abuse Program  [phone]   Psychological Services Organization         Address  Phone  Notes  [facility] [place]  [place]  [phone]    [place] [address], [place] or [phone]    Mobile Crisis Teams Organization         Address  Phone  Notes  [facility], Mobile Crisis Care Unit  [phone]   [facility]  [address] [place], [place]   [place] [address], [place] [phone]    Self-Help/Support Groups Organization         Address  Phone             Notes  Congress. of [place] - variety of support groups  [place] Call for more information  Narcotics Anonymous (NA), [facility] [address], [employer] [place]  2 meetings at this location   Special educational needs teacher         Address  Phone  Notes  [facility]AP Residential Treatment [place],    [place]  [phone]   [facility]  [address] [place] [id number], [place], [place]   [place], [place] [phone] Admissions: 8am-3pm M-F  Incentives Substance [place] [address]    [place], [place] [phone]   [facility] [address] [place], [place], [place]   [facility] [address],  [place], [place]   [facility] Programs - Intensive Outpatient [address]een [address], [place], [place]   [facility] ([place].,  [place], [place] or [phone]   [facility] Services (RTS) [address], [place], [place] Accepts Medicaid  [facility] [place] [address],  [place] [phone] Substance Abuse/Addiction Treatment   [facility] Resources Organization         Address  Phone  Notes  Lexicographer Services  (  [phone]   Domenic Schwab, PhD [address], [place], [place]   [phone] or [phone]   [place]  [place], [place] [phone]   [address], [place], [place] [phone] Insurance/Medicaid/sponsorship through [facility] and Families [address] [place]                                    [place], [place] [phone] [place] [address], [place] [phone]    Dr. Adele Schilder  [phone]   [facility] Dept. 1) [address] [employer] 2) [place] 3)  [address], [place] [phone]  [phone]   [place] [phone] or [phone] (After Hours)

## 2015-08-11 NOTE — ED Notes (Signed)
Pt says he is not sure why he's here.  Caretaker says pt has confusion.  Denies any voiding difficulties.  Denies any pain.  Treated for UTI about 3 weeks ago.  Pt did complete antibiotic course per friend.

## 2015-08-11 NOTE — Care Management (Addendum)
Spoke with patient daughter who wanted to know about placement, explained that this would require 3 night inpatient stay in hospital due to Medicare rules.  Explained that patient could go as self payer. Discussed with the PA Tammy who stated that patient would likely not be admitted.

## 2015-08-11 NOTE — ED Provider Notes (Signed)
CSN: [id number]     Arrival date & time 08/11/15  1406 History   First MD Initiated Contact with Patient 08/11/15 1536     Chief Complaint  Patient presents with  . Urinary Tract Infection     (Consider location/radiation/quality/duration/timing/severity/associated sxs/prior Treatment) The history is provided by the patient and a caregiver (daughter who is also POA).    Curtis Lang is a 82 y.o. male with hx of dementia who presents to the Emergency Department.  Daughter is requesting evaluation for possible recurrent UTI.  She reports increased confusion for several days and states he was treated about 3 weeks ago for UTI.  She states he completed the antibiotics.  She also states he is combative at times and she is requesting possible nursing home placement.  She is also concerned that he is not taking his medications regularly.  patient denies any pain or other symptoms.   Past Medical History  Diagnosis Date  . Arteriosclerotic cardiovascular disease (ASCVD)     BMS to RCA 1997; cutting ballon for RCA restenosis in 2001; cath 12/10 - 100% RCA L->R collaterals; nl EF; 40% LAD and OM1  . Hypertension   . Hyperlipidemia   . Cerebrovascular disease     bilateral bruits; duplex in 2009 - mild plaque w/o stenosis  . Peripheral vascular disease (HCC)     ABI on 0.87 on left  . Tobacco abuse, in remission     60 pack years; discontinued in 2003  . COPD (chronic obstructive pulmonary disease) ([place])   . Fasting hyperglycemia   . DDD (degenerative disc disease), cervical   . Muscle discomfort     No improvement after statins temporary discontinued  . Hypercholesteremia   . Dementia   . Atrial fibrillation ([place])   . Lymphoma ([place])     chemotherapy and radiation therapy in 2006; recurrence in 2007  . Squamous cell carcinoma [facility])     s/p Mohs surgery  . Cancer ([place])     b cell lymphoma  . Diffuse large B cell lymphoma ([place]) 03/18/2010  . CHF (congestive heart failure) [facility])     Past Surgical History  Procedure Laterality Date  . Anterior fusion cervical spine  2003    posterior?  . Inguinal hernia repair  1996    Right  . Orif femur fracture  1996  . Mohs surgery    . Colonoscopy  05/16/2011    Procedure: COLONOSCOPY;  Surgeon: Jamesetta So; diverticulosis, sigmoid polypectomy  . Port-a-cath removal Right 03/10/2013    Procedure: REMOVAL PORT-A-CATH;  Surgeon: Scherry Ran, MD;  Location: [facility];  Service: General;  Laterality: Right;  . Pyloric stenosis surgery      infant   Family History  Problem Relation Age of Onset  . Stroke Father     deceased - 40  . Stroke Mother     deceased - 60   . Heart attack Brother 28  . Heart attack Sister   . Colon cancer Neg Hx    Social History  Substance Use Topics  . Smoking status: Former Smoker -- 1.00 packs/day for 50 years    Types: Cigarettes    Start date: 09/19/1943    Quit date: 06/12/1996  . Smokeless tobacco: Never Used  . Alcohol Use: No    Review of Systems  Constitutional: Negative for fever, chills and appetite change.  HENT: Negative for congestion.   Respiratory: Negative for cough and shortness of breath.   Gastrointestinal: Negative  for nausea, vomiting and abdominal pain.  Genitourinary: Negative for dysuria, flank pain, decreased urine volume and penile pain.  Musculoskeletal: Negative for myalgias and back pain.  Skin: Negative for rash.  Neurological: Negative for dizziness, weakness, numbness and headaches.      Allergies  Levaquin  Home Medications   Prior to Admission medications   Medication Sig Start Date End Date Taking? Authorizing Provider  acetaminophen (TYLENOL) 325 MG tablet Take 650 mg by mouth daily as needed for mild pain or moderate pain.    Historical Provider, MD  apixaban (ELIQUIS) 5 MG TABS tablet Take 1 tablet (5 mg total) by mouth 2 (two) times daily. 01/14/15   Arnoldo Lenis, MD  atorvastatin (LIPITOR) 40 MG tablet Take 0.5 tablets by mouth  daily. 05/21/15   Historical Provider, MD  donepezil (ARICEPT) 10 MG tablet Take 10 mg by mouth at bedtime.    Historical Provider, MD  ENSURE (ENSURE) Take 237 mLs by mouth daily as needed (FOR NUTRITIONAL SUPPLEMENT).    Historical Provider, MD  fluticasone (FLONASE) 50 MCG/ACT nasal spray Place 2 sprays into both nostrils daily.    Historical Provider, MD  furosemide (LASIX) 40 MG tablet TAKE 1 TABLET BY MOUTH 2 TIMES DAILY. 06/16/15   Arnoldo Lenis, MD  lisinopril (PRINIVIL,ZESTRIL) 40 MG tablet Take 1 tablet (40 mg total) by mouth daily. 08/19/14   Arnoldo Lenis, MD  LORazepam (ATIVAN) 0.5 MG tablet Take 0.5 mg by mouth every 8 (eight) hours.    Historical Provider, MD  LORazepam (ATIVAN) 1 MG tablet Take 1 tablet (1 mg total) by mouth 3 (three) times daily as needed for anxiety. 04/02/15   Fredia Sorrow, MD  metoprolol succinate (TOPROL-XL) 50 MG 24 hr tablet Take 50 mg by mouth daily. Take with or immediately following a meal.    Historical Provider, MD  potassium chloride SA (K-DUR,KLOR-CON) 20 MEQ tablet Take 1 tablet (20 mEq total) by mouth daily. Patient taking differently: Take 10 mEq by mouth 2 (two) times daily.  01/14/15   Arnoldo Lenis, MD  sertraline (ZOLOFT) 25 MG tablet Take 1 tablet (25 mg total) by mouth daily. 06/16/15   Pieter Partridge, DO   BP 119/94 mmHg  Pulse 94  Temp(Src) 97.8 F (36.6 C) (Oral)  Resp 16  Ht 5\' 6"  (1.676 m)  Wt 68.947 kg  BMI 24.55 kg/m2  SpO2 97% Physical Exam  Constitutional: He is oriented to person, place, and time. He appears well-developed and well-nourished. No distress.  HENT:  Head: Normocephalic.  Mouth/Throat: Oropharynx is clear and moist.  Eyes: Conjunctivae are normal. Pupils are equal, round, and reactive to light.  Cardiovascular: Normal rate and regular rhythm.   No murmur heard. Pulmonary/Chest: Effort normal and breath sounds normal. No respiratory distress.  Abdominal: Soft. He exhibits no distension. There is no  tenderness. There is no rebound and no guarding.  Musculoskeletal: Normal range of motion. He exhibits no edema.  Neurological: He is alert and oriented to person, place, and time.  Skin: Skin is warm and dry. No rash noted.  Nursing note and vitals reviewed.   ED Course  Procedures (including critical care time) Labs Review Labs Reviewed  URINALYSIS, ROUTINE W REFLEX MICROSCOPIC (NOT AT [facility]) - Abnormal; Notable for the following:    Bilirubin Urine SMALL (*)    All other components within normal limits  CBC WITH DIFFERENTIAL/PLATELET - Abnormal; Notable for the following:    RBC 4.03 (*)  All other components within normal limits  BASIC METABOLIC PANEL - Abnormal; Notable for the following:    Chloride 100 (*)    Glucose, Bld 100 (*)    Creatinine, Ser 1.25 (*)    GFR calc non Af Amer 52 (*)    All other components within normal limits  URINE RAPID DRUG SCREEN, HOSP PERFORMED - Abnormal; Notable for the following:    Benzodiazepines POSITIVE (*)    All other components within normal limits    Imaging Review No results found. I have personally reviewed and evaluated these images and lab results as part of my medical decision-making.   EKG Interpretation None      MDM   Final diagnoses:  Dehydration    Pt is well appearing, vitals stable.  Non-toxic appearing. Labs reassuring. daughter of the patient requesting nursing home placement.  I have explained to her that patient does not meet criteria for admission at this time.  Social worker was contacted and came to ED to speak with her and resources given.  No concerning sx's for urosepsis.  Urine concentrated and pt and daughter have been counseled on proper rehydration and close PMD f/u.  Pt ambulates in the dept with steady gait, stable for d/c, cooperative during ED stay.    Pt also seen by Dr. Dayna Barker and care plan discussed.    Kem Parkinson, PA-C 08/11/15 2154  Merrily Pew, MD 08/11/15 2337

## 2015-08-19 ENCOUNTER — Ambulatory Visit (INDEPENDENT_AMBULATORY_CARE_PROVIDER_SITE_OTHER): Payer: Medicare Other

## 2015-08-19 ENCOUNTER — Other Ambulatory Visit: Payer: Self-pay

## 2015-08-19 DIAGNOSIS — I5022 Chronic systolic (congestive) heart failure: Secondary | ICD-10-CM | POA: Diagnosis not present

## 2015-08-19 LAB — ECHOCARDIOGRAM COMPLETE
AO mean calculated velocity dopler: 117 cm/s
AOPV: 0.48 m/s
AV Mean grad: 6 mmHg
AV Peak grad: 11 mmHg
AV peak Index: 0.93
AV vel: 1.81
CHL CUP STROKE VOLUME: 56 mL
E decel time: 183 msec
FS: 21 % — AB (ref 28–44)
IV/PV OW: 0.93
LDCA: 3.46 cm2
LEFT ATRIUM END SYS DIAM: 39 cm
LVOT peak vel: 80.8 m/s
LVOTVTI: 0.52 cm
MV Peak grad: 6 mmHg
P 1/2 time: 448 ms
PW: 10.7 mm — AB (ref 0.6–1.1)
VTI: 30.8 cm
Valve area index: 1.01
Valve area: 1.81 cm2

## 2015-08-21 ENCOUNTER — Emergency Department (HOSPITAL_COMMUNITY)
Admission: EM | Admit: 2015-08-21 | Discharge: 2015-08-21 | Disposition: A | Discharge status: Home / Self Care | Payer: Medicare Other | Attending: Emergency Medicine | Admitting: Emergency Medicine

## 2015-08-21 ENCOUNTER — Emergency Department (HOSPITAL_COMMUNITY): Payer: Medicare Other

## 2015-08-21 ENCOUNTER — Encounter (HOSPITAL_COMMUNITY): Payer: Self-pay

## 2015-08-21 DIAGNOSIS — Y9285 Railroad track as the place of occurrence of the external cause: Secondary | ICD-10-CM | POA: Insufficient documentation

## 2015-08-21 DIAGNOSIS — I509 Heart failure, unspecified: Secondary | ICD-10-CM | POA: Diagnosis not present

## 2015-08-21 DIAGNOSIS — Y999 Unspecified external cause status: Secondary | ICD-10-CM | POA: Diagnosis not present

## 2015-08-21 DIAGNOSIS — E7800 Pure hypercholesterolemia, unspecified: Secondary | ICD-10-CM | POA: Diagnosis not present

## 2015-08-21 DIAGNOSIS — Z87891 Personal history of nicotine dependence: Secondary | ICD-10-CM | POA: Insufficient documentation

## 2015-08-21 DIAGNOSIS — I110 Hypertensive heart disease with heart failure: Secondary | ICD-10-CM | POA: Insufficient documentation

## 2015-08-21 DIAGNOSIS — J449 Chronic obstructive pulmonary disease, unspecified: Secondary | ICD-10-CM | POA: Diagnosis not present

## 2015-08-21 DIAGNOSIS — W1839XA Other fall on same level, initial encounter: Secondary | ICD-10-CM | POA: Diagnosis not present

## 2015-08-21 DIAGNOSIS — Y9389 Activity, other specified: Secondary | ICD-10-CM | POA: Diagnosis not present

## 2015-08-21 DIAGNOSIS — S80211A Abrasion, right knee, initial encounter: Secondary | ICD-10-CM | POA: Insufficient documentation

## 2015-08-21 DIAGNOSIS — Z79899 Other long term (current) drug therapy: Secondary | ICD-10-CM | POA: Diagnosis not present

## 2015-08-21 DIAGNOSIS — S61011A Laceration without foreign body of right thumb without damage to nail, initial encounter: Secondary | ICD-10-CM | POA: Diagnosis not present

## 2015-08-21 DIAGNOSIS — E785 Hyperlipidemia, unspecified: Secondary | ICD-10-CM | POA: Diagnosis not present

## 2015-08-21 MED ORDER — LIDOCAINE HCL (PF) 1 % IJ SOLN
5.0000 mL | Freq: Once | INTRAMUSCULAR | Status: AC
  Administered 2015-08-21: 5 mL
  Filled 2015-08-21: qty 5

## 2015-08-21 MED ORDER — POVIDONE-IODINE 10 % EX SOLN
CUTANEOUS | Status: AC
  Filled 2015-08-21: qty 118

## 2015-08-21 NOTE — ED Notes (Signed)
I cut my right thumb earlier this evening over some rebar when I stumbled and fell over the railroad ties.

## 2015-08-21 NOTE — ED Provider Notes (Signed)
CSN: [id number]     Arrival date & time 08/21/15  2124 History  By signing my name below, I, Helane Gunther, attest that this documentation has been prepared under the direction and in the presence of Davonna Belling, MD. Electronically Signed: Helane Gunther, ED Scribe. 08/21/2015. 10:04 PM.    Chief Complaint  Patient presents with  . Laceration   The history is provided by the patient and the spouse. No language interpreter was used.   HPI Comments: Curtis Lang is a 82 y.o. male former smoker with a PMHx of dementia, ASCVD, CHF, A-fib, HTN, HLD, hypercholesteremia, cerebrovascular disease, peripheral vascular disease, and cancer who presents to the Emergency Department complaining of a laceration to the right thumb sustained 9 hours ago. Pt states he stumbled and fell over some railroad ties, striking his thumb on something, which caused a laceration that is still oozing blood. Per wife, she has cleaned and dressed the wound twice today, but states that it still would not stop bleeding. Pt reports associated abrasions to the right knee. He notes his last tetanus shot was within the past 2 years and states he is right-handed.  Past Medical History  Diagnosis Date  . Arteriosclerotic cardiovascular disease (ASCVD)     BMS to RCA 1997; cutting ballon for RCA restenosis in 2001; cath 12/10 - 100% RCA L->R collaterals; nl EF; 40% LAD and OM1  . Hypertension   . Hyperlipidemia   . Cerebrovascular disease     bilateral bruits; duplex in 2009 - mild plaque w/o stenosis  . Peripheral vascular disease (HCC)     ABI on 0.87 on left  . Tobacco abuse, in remission     60 pack years; discontinued in 2003  . COPD (chronic obstructive pulmonary disease) ([place])   . Fasting hyperglycemia   . DDD (degenerative disc disease), cervical   . Muscle discomfort     No improvement after statins temporary discontinued  . Hypercholesteremia   . Dementia   . Atrial fibrillation ([place])   . Lymphoma ([place])      chemotherapy and radiation therapy in 2006; recurrence in 2007  . Squamous cell carcinoma [facility])     s/p Mohs surgery  . Cancer ([place])     b cell lymphoma  . Diffuse large B cell lymphoma ([place]) 03/18/2010  . CHF (congestive heart failure) [facility])    Past Surgical History  Procedure Laterality Date  . Anterior fusion cervical spine  2003    posterior?  . Inguinal hernia repair  1996    Right  . Orif femur fracture  1996  . Mohs surgery    . Colonoscopy  05/16/2011    Procedure: COLONOSCOPY;  Surgeon: Jamesetta So; diverticulosis, sigmoid polypectomy  . Port-a-cath removal Right 03/10/2013    Procedure: REMOVAL PORT-A-CATH;  Surgeon: Scherry Ran, MD;  Location: [facility];  Service: General;  Laterality: Right;  . Pyloric stenosis surgery      infant   Family History  Problem Relation Age of Onset  . Stroke Father     deceased - 15  . Stroke Mother     deceased - 38   . Heart attack Brother 1  . Heart attack Sister   . Colon cancer Neg Hx    Social History  Substance Use Topics  . Smoking status: Former Smoker -- 1.00 packs/day for 50 years    Types: Cigarettes    Start date: 09/19/1945    Quit date: 06/12/1996  . Smokeless tobacco:  Never Used  . Alcohol Use: No    Review of Systems  Skin: Positive for wound.  All other systems reviewed and are negative.   Allergies  Levaquin  Home Medications   Prior to Admission medications   Medication Sig Start Date End Date Taking? Authorizing Provider  acetaminophen (TYLENOL) 325 MG tablet Take 650 mg by mouth daily as needed for mild pain or moderate pain.   Yes Historical Provider, MD  apixaban (ELIQUIS) 5 MG TABS tablet Take 1 tablet (5 mg total) by mouth 2 (two) times daily. 01/14/15  Yes Arnoldo Lenis, MD  atorvastatin (LIPITOR) 40 MG tablet Take 0.5 tablets by mouth daily. 05/21/15  Yes Historical Provider, MD  donepezil (ARICEPT) 10 MG tablet Take 10 mg by mouth at bedtime.   Yes Historical Provider, MD   ENSURE (ENSURE) Take 237 mLs by mouth daily as needed (FOR NUTRITIONAL SUPPLEMENT).   Yes Historical Provider, MD  fluticasone (FLONASE) 50 MCG/ACT nasal spray Place 2 sprays into both nostrils as needed for allergies.    Yes Historical Provider, MD  furosemide (LASIX) 40 MG tablet TAKE 1 TABLET BY MOUTH 2 TIMES DAILY. 06/16/15  Yes Arnoldo Lenis, MD  lisinopril (PRINIVIL,ZESTRIL) 40 MG tablet Take 1 tablet (40 mg total) by mouth daily. 08/19/14  Yes Arnoldo Lenis, MD  LORazepam (ATIVAN) 0.5 MG tablet Take 0.5 mg by mouth every 8 (eight) hours as needed for anxiety.    Yes Historical Provider, MD  metoprolol succinate (TOPROL-XL) 50 MG 24 hr tablet Take 50 mg by mouth daily. Take with or immediately following a meal.   Yes Historical Provider, MD  potassium chloride SA (K-DUR,KLOR-CON) 20 MEQ tablet Take 1 tablet (20 mEq total) by mouth daily. Patient taking differently: Take 10 mEq by mouth daily.  01/14/15  Yes Arnoldo Lenis, MD  sertraline (ZOLOFT) 25 MG tablet Take 1 tablet (25 mg total) by mouth daily. 06/16/15  Yes Adam R Jaffe, DO   BP 153/80 mmHg  Pulse 96  Temp(Src) 98.5 F (36.9 C) (Oral)  Resp 18  Ht 5' 6.5" (1.689 m)  Wt 155 lb 11.2 oz (70.625 kg)  BMI 24.76 kg/m2  SpO2 100% Physical Exam  Constitutional: He is oriented to person, place, and time. He appears well-developed and well-nourished.  Mild dementia, which is baseline for pt  HENT:  Head: Normocephalic and atraumatic.  Eyes: Conjunctivae are normal. Right eye exhibits no discharge. Left eye exhibits no discharge.  Pulmonary/Chest: Effort normal. No respiratory distress.  Neurological: He is alert and oriented to person, place, and time. Coordination normal.  Skin: Skin is warm and dry. No rash noted. He is not diaphoretic. No erythema.  1.5 cm laceration along the medial distal phalanx which goes up to the nailbed, but the nail appears to be intact.  Mild underlying TTP, no TTP to the DIP or PIP joints, no TTP to  the wrist or elbow. Mild abrasion to the right knee  Psychiatric: He has a normal mood and affect.  Nursing note and vitals reviewed.   ED Course  Procedures  DIAGNOSTIC STUDIES: Oxygen Saturation is 100% on RA, normal by my interpretation.    COORDINATION OF CARE: 9:54 PM - Discussed plans to numb, cleanse, and suture the wound after diagnostic imaging has been obtained. Pt advised of plan for treatment and pt agrees.  Labs Review Labs Reviewed - No data to display  Imaging Review Dg Finger Thumb Right  08/21/2015  CLINICAL DATA:  Right  thumb pain, laceration EXAM: RIGHT THUMB 2+V COMPARISON:  None. FINDINGS: Degenerative changes within the first MTP, MCP and IP joints. No fracture, subluxation or dislocation. Soft tissues are intact. IMPRESSION: No acute bony abnormality. Electronically Signed   By: Rolm Baptise M.D.   On: 08/21/2015 22:40   I have personally reviewed and evaluated these images and lab results as part of my medical decision-making.   EKG Interpretation None      MDM   Final diagnoses:  Thumb laceration, right, initial encounter    Patient with fall. Thumb laceration. Is on anticoagulation but no evidence of head trauma. Wound closed a discharge home.  I personally performed the services described in this documentation, which was scribed in my presence. The recorded information has been reviewed and is accurate.     Davonna Belling, MD 08/21/15 2306

## 2015-08-21 NOTE — ED Provider Notes (Signed)
THIS IS A SHARD VISIT WITH DR. PICKERING PROCEDURE NOTE: LACERATION REPAIR Performed by: NEESE,HOPE Authorized by: NEESE,HOPE Consent: Verbal consent obtained. Risks and benefits: risks, benefits and alternatives were discussed Consent given by: patient Patient identity confirmed: provided demographic data Prepped and Draped in normal sterile fashion Cleaned with Betadine  Wound explored  Laceration Location: right thumb  Laceration Length: 1.5 cm  No Foreign Bodies seen or palpated  Anesthesia: local infiltration  Local anesthetic: lidocaine 1% without epinephrine  Anesthetic total: 1 ml  Irrigation method: syringe Amount of cleaning: standard  Skin closure: 5-0 prolene  Number of sutures: 5  Technique: simple interrupted  Patient tolerance: Patient tolerated the procedure well with no immediate complications.   Pressure dressing applied  [facility], NP 08/21/15 [place], MD 08/22/15 2211

## 2015-08-21 NOTE — Discharge Instructions (Signed)
Stitches, Staples, or Adhesive Wound Closure  °Health care providers use stitches (sutures), staples, and certain glue (skin adhesives) to hold skin together while it heals (wound closure). You may need this treatment after you have surgery or if you cut your skin accidentally. These methods help your skin to heal more quickly and make it less likely that you will have a scar. A wound may take several months to heal completely.  °The type of wound you have determines when your wound gets closed. In most cases, the wound is closed as soon as possible (primary skin closure). Sometimes, closure is delayed so the wound can be cleaned and allowed to heal naturally. This reduces the chance of infection. Delayed closure may be needed if your wound:  °Is caused by a bite.  °Happened more than 6 hours ago.  °Involves loss of skin or the tissues under the skin.  °Has dirt or debris in it that cannot be removed.  °Is infected. °WHAT ARE THE DIFFERENT KINDS OF WOUND CLOSURES?  °There are many options for wound closure. The one that your health care provider uses depends on how deep and how large your wound is.  °Adhesive Glue  °To use this type of glue to close a wound, your health care provider holds the edges of the wound together and paints the glue on the surface of your skin. You may need more than one layer of glue. Then the wound may be covered with a light bandage (dressing).  °This type of skin closure may be used for small wounds that are not deep (superficial). Using glue for wound closure is less painful than other methods. It does not require a medicine that numbs the area (local anesthetic). This method also leaves nothing to be removed. Adhesive glue is often used for children and on facial wounds.  °Adhesive glue cannot be used for wounds that are deep, uneven, or bleeding. It is not used inside of a wound.  °Adhesive Strips  °These strips are made of sticky (adhesive), porous paper. They are applied across your  skin edges like a regular adhesive bandage. You leave them on until they fall off.  °Adhesive strips may be used to close very superficial wounds. They may also be used along with sutures to improve the closure of your skin edges.  °Sutures  °Sutures are the oldest method of wound closure. Sutures can be made from natural substances, such as silk, or from synthetic materials, such as nylon and steel. They can be made from a material that your body can break down as your wound heals (absorbable), or they can be made from a material that needs to be removed from your skin (nonabsorbable). They come in many different strengths and sizes.  °Your health care provider attaches the sutures to a steel needle on one end. Sutures can be passed through your skin, or through the tissues beneath your skin. Then they are tied and cut. Your skin edges may be closed in one continuous stitch or in separate stitches.  °Sutures are strong and can be used for all kinds of wounds. Absorbable sutures may be used to close tissues under the skin. The disadvantage of sutures is that they may cause skin reactions that lead to infection. Nonabsorbable sutures need to be removed.  °Staples  °When surgical staples are used to close a wound, the edges of your skin on both sides of the wound are brought close together. A staple is placed across the wound, and   an instrument secures the edges together. Staples are often used to close surgical cuts (incisions).  °Staples are faster to use than sutures, and they cause less skin reaction. Staples need to be removed using a tool that bends the staples away from your skin.  °HOW DO I CARE FOR MY WOUND CLOSURE?  °Take medicines only as directed by your health care provider.  °If you were prescribed an antibiotic medicine for your wound, finish it all even if you start to feel better.  °Use ointments or creams only as directed by your health care provider.  °Wash your hands with soap and water before and  after touching your wound.  °Do not soak your wound in water. Do not take baths, swim, or use a hot tub until your health care provider approves.  °Ask your health care provider when you can start showering. Cover your wound if directed by your health care provider.  °Do not take out your own sutures or staples.  °Do not pick at your wound. Picking can cause an infection.  °Keep all follow-up visits as directed by your health care provider. This is important. °HOW LONG WILL I HAVE MY WOUND CLOSURE?  °Leave adhesive glue on your skin until the glue peels away.  °Leave adhesive strips on your skin until the strips fall off.  °Absorbable sutures will dissolve within several days.  °Nonabsorbable sutures and staples must be removed. The location of the wound will determine how long they stay in. This can range from several days to a couple of weeks. °WHEN SHOULD I SEEK HELP FOR MY WOUND CLOSURE?  °Contact your health care provider if:  °You have a fever.  °You have chills.  °You have drainage, redness, swelling, or pain at your wound.  °There is a bad smell coming from your wound.  °The skin edges of your wound start to separate after your sutures have been removed.  °Your wound becomes thick, raised, and darker in color after your sutures come out (scarring). °This information is not intended to replace advice given to you by your health care provider. Make sure you discuss any questions you have with your health care provider.  °Document Released: 02/21/2001 Document Revised: 06/19/2014 Document Reviewed: 11/05/2013  °Elsevier Interactive Patient Education ©2016 [employer]  ° °

## 2015-08-24 ENCOUNTER — Telehealth: Payer: Self-pay | Admitting: *Deleted

## 2015-08-24 NOTE — Telephone Encounter (Signed)
Pt daughter aware, routed to pcp

## 2015-08-24 NOTE — Telephone Encounter (Signed)
-----   Message from Arnoldo Lenis, MD sent at 08/20/2015 10:32 AM EST ----- Echo shows his heart function has improved, but has not quite yet normalized. Continue current meds  Zandra Abts MD

## 2015-08-26 ENCOUNTER — Ambulatory Visit (INDEPENDENT_AMBULATORY_CARE_PROVIDER_SITE_OTHER): Payer: Medicare Other | Admitting: Otolaryngology

## 2015-08-26 DIAGNOSIS — H903 Sensorineural hearing loss, bilateral: Secondary | ICD-10-CM

## 2015-08-31 ENCOUNTER — Encounter (HOSPITAL_COMMUNITY): Payer: Self-pay | Admitting: *Deleted

## 2015-08-31 ENCOUNTER — Emergency Department (HOSPITAL_COMMUNITY)
Admission: EM | Admit: 2015-08-31 | Discharge: 2015-09-03 | Disposition: A | Discharge status: Other Acute Inpatient Hospital | Payer: Medicare Other | Attending: Emergency Medicine | Admitting: Emergency Medicine

## 2015-08-31 DIAGNOSIS — Z79899 Other long term (current) drug therapy: Secondary | ICD-10-CM | POA: Diagnosis not present

## 2015-08-31 DIAGNOSIS — J449 Chronic obstructive pulmonary disease, unspecified: Secondary | ICD-10-CM | POA: Insufficient documentation

## 2015-08-31 DIAGNOSIS — I110 Hypertensive heart disease with heart failure: Secondary | ICD-10-CM | POA: Diagnosis not present

## 2015-08-31 DIAGNOSIS — I679 Cerebrovascular disease, unspecified: Secondary | ICD-10-CM | POA: Insufficient documentation

## 2015-08-31 DIAGNOSIS — Z8572 Personal history of non-Hodgkin lymphomas: Secondary | ICD-10-CM | POA: Insufficient documentation

## 2015-08-31 DIAGNOSIS — R4182 Altered mental status, unspecified: Secondary | ICD-10-CM | POA: Diagnosis present

## 2015-08-31 DIAGNOSIS — I509 Heart failure, unspecified: Secondary | ICD-10-CM | POA: Diagnosis not present

## 2015-08-31 DIAGNOSIS — E785 Hyperlipidemia, unspecified: Secondary | ICD-10-CM | POA: Diagnosis not present

## 2015-08-31 DIAGNOSIS — I739 Peripheral vascular disease, unspecified: Secondary | ICD-10-CM | POA: Diagnosis not present

## 2015-08-31 DIAGNOSIS — I2510 Atherosclerotic heart disease of native coronary artery without angina pectoris: Secondary | ICD-10-CM | POA: Diagnosis not present

## 2015-08-31 DIAGNOSIS — I4891 Unspecified atrial fibrillation: Secondary | ICD-10-CM | POA: Diagnosis not present

## 2015-08-31 DIAGNOSIS — R4689 Other symptoms and signs involving appearance and behavior: Secondary | ICD-10-CM

## 2015-08-31 DIAGNOSIS — F99 Mental disorder, not otherwise specified: Secondary | ICD-10-CM | POA: Insufficient documentation

## 2015-08-31 DIAGNOSIS — Z87891 Personal history of nicotine dependence: Secondary | ICD-10-CM | POA: Insufficient documentation

## 2015-08-31 DIAGNOSIS — F0390 Unspecified dementia without behavioral disturbance: Secondary | ICD-10-CM | POA: Diagnosis not present

## 2015-08-31 LAB — COMPREHENSIVE METABOLIC PANEL
ALT: 13 U/L — ABNORMAL LOW (ref 17–63)
AST: 24 U/L (ref 15–41)
Albumin: 4.1 g/dL (ref 3.5–5.0)
Alkaline Phosphatase: 101 U/L (ref 38–126)
Anion gap: 8 (ref 5–15)
BUN: 17 mg/dL (ref 6–20)
CHLORIDE: 100 mmol/L — AB (ref 101–111)
CO2: 29 mmol/L (ref 22–32)
Calcium: 9.2 mg/dL (ref 8.9–10.3)
Creatinine, Ser: 1.25 mg/dL — ABNORMAL HIGH (ref 0.61–1.24)
GFR, EST NON AFRICAN AMERICAN: 52 mL/min — AB (ref >60)
Glucose, Bld: 113 mg/dL — ABNORMAL HIGH (ref 65–99)
POTASSIUM: 4.5 mmol/L (ref 3.5–5.1)
SODIUM: 137 mmol/L (ref 135–145)
Total Bilirubin: 0.5 mg/dL (ref 0.3–1.2)
Total Protein: 8.1 g/dL (ref 6.5–8.1)

## 2015-08-31 LAB — CBC WITH DIFFERENTIAL/PLATELET
BASOS ABS: 0 10*3/uL (ref 0.0–0.1)
Basophils Relative: 0 %
EOS ABS: 0.3 10*3/uL (ref 0.0–0.7)
EOS PCT: 3 %
HCT: 39.3 % (ref 39.0–52.0)
Hemoglobin: 13 g/dL (ref 13.0–17.0)
LYMPHS PCT: 18 %
Lymphs Abs: 2.2 10*3/uL (ref 0.7–4.0)
MCH: 32.4 pg (ref 26.0–34.0)
MCHC: 33.1 g/dL (ref 30.0–36.0)
MCV: 98 fL (ref 78.0–100.0)
MONO ABS: 0.5 10*3/uL (ref 0.1–1.0)
Monocytes Relative: 4 %
Neutro Abs: 8.9 10*3/uL — ABNORMAL HIGH (ref 1.7–7.7)
Neutrophils Relative %: 75 %
PLATELETS: 240 10*3/uL (ref 150–400)
RBC: 4.01 MIL/uL — ABNORMAL LOW (ref 4.22–5.81)
RDW: 13.8 % (ref 11.5–15.5)
WBC: 11.9 10*3/uL — AB (ref 4.0–10.5)

## 2015-08-31 LAB — URINALYSIS, ROUTINE W REFLEX MICROSCOPIC
Bilirubin Urine: NEGATIVE
Glucose, UA: NEGATIVE mg/dL
Hgb urine dipstick: NEGATIVE
Ketones, ur: NEGATIVE mg/dL
LEUKOCYTES UA: NEGATIVE
NITRITE: NEGATIVE
Protein, ur: NEGATIVE mg/dL
SPECIFIC GRAVITY, URINE: 1.01 (ref 1.005–1.030)
pH: 7 (ref 5.0–8.0)

## 2015-08-31 LAB — RAPID URINE DRUG SCREEN, HOSP PERFORMED
AMPHETAMINES: NOT DETECTED
BENZODIAZEPINES: NOT DETECTED
Barbiturates: NOT DETECTED
COCAINE: NOT DETECTED
OPIATES: NOT DETECTED
TETRAHYDROCANNABINOL: NOT DETECTED

## 2015-08-31 LAB — ETHANOL: Alcohol, Ethyl (B): <5 mg/dL (ref <5)
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
## 2015-08-31 NOTE — ED Notes (Signed)
Traiton Lingg, daughter and POA [phone]

## 2015-08-31 NOTE — ED Notes (Signed)
Pt began walking out of room and stating he was going to leave. Pt instructed to go back into room and informed more testing needed to be done. Pt again stated he was going to leave. MD Lacinda Axon notified.

## 2015-08-31 NOTE — ED Notes (Signed)
Wound check of right thumb. Family member states pt has pulled out his stitches by day 3.

## 2015-08-31 NOTE — ED Notes (Signed)
Patient ambulated to the bathroom.
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
## 2015-08-31 NOTE — ED Notes (Signed)
Pt wanded at this time. Security at bedside

## 2015-09-01 DIAGNOSIS — F99 Mental disorder, not otherwise specified: Secondary | ICD-10-CM | POA: Diagnosis not present

## 2015-09-01 MED ORDER — HALOPERIDOL LACTATE 5 MG/ML IJ SOLN
5.0000 mg | Freq: Once | INTRAMUSCULAR | Status: AC
  Administered 2015-09-01: 5 mg via INTRAMUSCULAR
  Filled 2015-09-01: qty 1

## 2015-09-01 MED ORDER — ALUM & MAG HYDROXIDE-SIMETH 200-200-20 MG/5ML PO SUSP: 30.0000 mL | ORAL | Status: DC | PRN

## 2015-09-01 MED ORDER — IBUPROFEN 400 MG PO TABS: 600.0000 mg | ORAL_TABLET | Freq: Three times a day (TID) | ORAL | Status: DC | PRN

## 2015-09-01 MED ORDER — ZOLPIDEM TARTRATE 5 MG PO TABS
5.0000 mg | ORAL_TABLET | Freq: Every evening | ORAL | Status: DC | PRN
  Administered 2015-09-01: 5 mg via ORAL
  Filled 2015-09-01: qty 1

## 2015-09-01 MED ORDER — LORAZEPAM 1 MG PO TABS
1.0000 mg | ORAL_TABLET | Freq: Three times a day (TID) | ORAL | Status: DC | PRN
  Administered 2015-09-01 (×2): 1 mg via ORAL
  Filled 2015-09-01 (×2): qty 1

## 2015-09-01 MED ORDER — ONDANSETRON HCL 4 MG PO TABS: 4.0000 mg | ORAL_TABLET | Freq: Three times a day (TID) | ORAL | Status: DC | PRN

## 2015-09-01 MED ORDER — ACETAMINOPHEN 325 MG PO TABS
650.0000 mg | ORAL_TABLET | ORAL | Status: DC | PRN
  Administered 2015-09-01: 650 mg via ORAL
  Filled 2015-09-01: qty 2

## 2015-09-01 MED ORDER — NICOTINE 21 MG/24HR TD PT24
21.0000 mg | MEDICATED_PATCH | Freq: Every day | TRANSDERMAL | Status: DC
  Filled 2015-09-01: qty 1

## 2015-09-01 NOTE — ED Notes (Signed)
Pt sleeping. Equal rise and fall of chest noted.

## 2015-09-01 NOTE — ED Notes (Signed)
Pt daughter called to check on pt. Informed daughter that pt is resting at this time.

## 2015-09-01 NOTE — ED Notes (Signed)
Pt given meal tray.

## 2015-09-01 NOTE — ED Notes (Signed)
Breakfast meal tray placed in patient's room. Pt asleep at this time.

## 2015-09-01 NOTE — ED Notes (Signed)
Pt resting with eyes closed, appears to be in no distress. Respirations are even and unlabored.  

## 2015-09-01 NOTE — ED Notes (Signed)
Pt sleeping. Equal rise and fall of chest noted. Daughter came to visit patient. Made aware of visitation policy.

## 2015-09-01 NOTE — ED Provider Notes (Signed)
Fatima, TSS, has evaluated patient and discussed with her PA, recommend inpatient geripsych admission, they will work on placement.   Rolland Porter, MD 09/01/15 [phone]

## 2015-09-01 NOTE — ED Notes (Signed)
Pts meal tray placed at bedside. Pt states he does not want his food now.

## 2015-09-01 NOTE — BH Assessment (Signed)
Pt has been faxed to: [facility] - @ [address] Alcide Clever
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
## 2015-09-01 NOTE — ED Notes (Signed)
Pt assisted to restroom at this time. No further needs expressed at this time.
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
## 2015-09-01 NOTE — ED Notes (Signed)
Patient ambulatory to restroom  ?

## 2015-09-01 NOTE — ED Notes (Signed)
Pt ambulated to restroom with sitter. 

## 2015-09-02 NOTE — ED Notes (Signed)
Please call pt daughter, Koal Martignetti [phone], upon patient disposition.

## 2015-09-02 NOTE — ED Notes (Signed)
Pt resting with eyes closed, appears to be in no distress. Respirations are even and unlabored.  

## 2015-09-02 NOTE — ED Notes (Signed)
Pt ambulated to restroom with assistance X1.

## 2015-09-02 NOTE — ED Notes (Signed)
Per Shaun at [facility] declined at [facility] due to dementia, still pending at several other facilities.

## 2015-09-03 NOTE — ED Notes (Signed)
Spoke with Lenn Cal regarding patients condition and pending placement

## 2015-09-03 NOTE — ED Notes (Signed)
Patient denies pain and is resting comfortably.  

## 2015-09-03 NOTE — ED Notes (Signed)
Address and contact information for [facility] given to patient's family

## 2015-09-03 NOTE — ED Notes (Signed)
Spoke with patients daughter regarding transfer to inpatient at [facility]. I told her she may come visit with him if she liked, even though its not official visiting hours.

## 2015-09-03 NOTE — Progress Notes (Signed)
Faxed IVC copies to [employer], intake Therapist, sports at [place]. Dr. Claudia Desanctis has accepted pt for admission to geriatric psych unit. States he can arrive anytime, and report can be called at [phone] once pt is ready for transfer.   Sharren Bridge, MSW, LCSW Clinical Social Work, Disposition  09/03/2015 [phone]

## 2015-09-03 NOTE — Progress Notes (Signed)
Followed up on inpatient geri-psych referrals.  Referred to the following facilities with probable bed openings today on geriatric units: [place] per [facility]- per [place] per [facility]- per [facility] will not have openings till at least 09/06/15 per [facility]. [facility], [place], [facility], [place], and [facility] unable to accept referrals for pts with dementia dx as geriatric units cannot accommodate this.  Sharren Bridge, MSW, LCSW Clinical Social Work, Disposition  09/03/2015 [phone]

## 2015-10-11 DIAGNOSIS — S72001A Fracture of unspecified part of neck of right femur, initial encounter for closed fracture: Secondary | ICD-10-CM

## 2015-10-11 HISTORY — DX: Fracture of unspecified part of neck of right femur, initial encounter for closed fracture: S72.001A

## 2015-10-15 ENCOUNTER — Observation Stay (HOSPITAL_COMMUNITY)
Admission: EM | Admit: 2015-10-15 | Discharge: 2015-10-16 | Disposition: A | Discharge status: Home / Self Care | Payer: Medicare Other | Attending: Internal Medicine | Admitting: Internal Medicine

## 2015-10-15 ENCOUNTER — Emergency Department (HOSPITAL_COMMUNITY): Payer: Medicare Other

## 2015-10-15 ENCOUNTER — Encounter (HOSPITAL_COMMUNITY): Payer: Self-pay | Admitting: Emergency Medicine

## 2015-10-15 DIAGNOSIS — Z87891 Personal history of nicotine dependence: Secondary | ICD-10-CM | POA: Diagnosis not present

## 2015-10-15 DIAGNOSIS — I110 Hypertensive heart disease with heart failure: Secondary | ICD-10-CM | POA: Insufficient documentation

## 2015-10-15 DIAGNOSIS — E785 Hyperlipidemia, unspecified: Secondary | ICD-10-CM | POA: Insufficient documentation

## 2015-10-15 DIAGNOSIS — J449 Chronic obstructive pulmonary disease, unspecified: Secondary | ICD-10-CM | POA: Diagnosis not present

## 2015-10-15 DIAGNOSIS — Z79899 Other long term (current) drug therapy: Secondary | ICD-10-CM | POA: Insufficient documentation

## 2015-10-15 DIAGNOSIS — F0390 Unspecified dementia without behavioral disturbance: Secondary | ICD-10-CM | POA: Diagnosis not present

## 2015-10-15 DIAGNOSIS — I4891 Unspecified atrial fibrillation: Secondary | ICD-10-CM | POA: Diagnosis not present

## 2015-10-15 DIAGNOSIS — I482 Chronic atrial fibrillation: Secondary | ICD-10-CM

## 2015-10-15 DIAGNOSIS — I10 Essential (primary) hypertension: Secondary | ICD-10-CM | POA: Diagnosis present

## 2015-10-15 DIAGNOSIS — I509 Heart failure, unspecified: Secondary | ICD-10-CM | POA: Diagnosis not present

## 2015-10-15 DIAGNOSIS — G9341 Metabolic encephalopathy: Secondary | ICD-10-CM | POA: Diagnosis not present

## 2015-10-15 DIAGNOSIS — R509 Fever, unspecified: Secondary | ICD-10-CM | POA: Diagnosis not present

## 2015-10-15 DIAGNOSIS — J209 Acute bronchitis, unspecified: Secondary | ICD-10-CM | POA: Diagnosis present

## 2015-10-15 DIAGNOSIS — R05 Cough: Secondary | ICD-10-CM | POA: Diagnosis present

## 2015-10-15 LAB — INFLUENZA PANEL BY PCR (TYPE A & B)
H1N1FLUPCR: NOT DETECTED
INFLBPCR: NEGATIVE
Influenza A By PCR: NEGATIVE

## 2015-10-15 LAB — URINE MICROSCOPIC-ADD ON

## 2015-10-15 LAB — CBC WITH DIFFERENTIAL/PLATELET
BASOS ABS: 0 10*3/uL (ref 0.0–0.1)
Basophils Relative: 0 %
EOS ABS: 0.2 10*3/uL (ref 0.0–0.7)
Eosinophils Relative: 2 %
HCT: 38.9 % — ABNORMAL LOW (ref 39.0–52.0)
HEMOGLOBIN: 12.9 g/dL — AB (ref 13.0–17.0)
LYMPHS ABS: 1.1 10*3/uL (ref 0.7–4.0)
LYMPHS PCT: 9 %
MCH: 32.3 pg (ref 26.0–34.0)
MCHC: 33.2 g/dL (ref 30.0–36.0)
MCV: 97.5 fL (ref 78.0–100.0)
Monocytes Absolute: 0.6 10*3/uL (ref 0.1–1.0)
Monocytes Relative: 6 %
NEUTROS PCT: 83 %
Neutro Abs: 9.7 10*3/uL — ABNORMAL HIGH (ref 1.7–7.7)
Platelets: 239 10*3/uL (ref 150–400)
RBC: 3.99 MIL/uL — AB (ref 4.22–5.81)
RDW: 14.1 % (ref 11.5–15.5)
WBC: 11.6 10*3/uL — AB (ref 4.0–10.5)

## 2015-10-15 LAB — URINALYSIS, ROUTINE W REFLEX MICROSCOPIC
BILIRUBIN URINE: NEGATIVE
GLUCOSE, UA: NEGATIVE mg/dL
Ketones, ur: NEGATIVE mg/dL
Leukocytes, UA: NEGATIVE
NITRITE: NEGATIVE
PH: 5.5 (ref 5.0–8.0)
PROTEIN: NEGATIVE mg/dL
SPECIFIC GRAVITY, URINE: 1.015 (ref 1.005–1.030)

## 2015-10-15 LAB — COMPREHENSIVE METABOLIC PANEL
ALK PHOS: 88 U/L (ref 38–126)
ALT: 17 U/L (ref 17–63)
AST: 20 U/L (ref 15–41)
Albumin: 3.7 g/dL (ref 3.5–5.0)
Anion gap: 8 (ref 5–15)
BUN: 13 mg/dL (ref 6–20)
CALCIUM: 8.9 mg/dL (ref 8.9–10.3)
CO2: 28 mmol/L (ref 22–32)
CREATININE: 0.95 mg/dL (ref 0.61–1.24)
Chloride: 103 mmol/L (ref 101–111)
GFR calc non Af Amer: >60 mL/min (ref >60)
Glucose, Bld: 117 mg/dL — ABNORMAL HIGH (ref 65–99)
Potassium: 3.5 mmol/L (ref 3.5–5.1)
SODIUM: 139 mmol/L (ref 135–145)
Total Bilirubin: 0.7 mg/dL (ref 0.3–1.2)
Total Protein: 7.6 g/dL (ref 6.5–8.1)

## 2015-10-15 LAB — I-STAT CG4 LACTIC ACID, ED: LACTIC ACID, VENOUS: 1.78 mmol/L (ref 0.5–2.0)

## 2015-10-15 LAB — POC OCCULT BLOOD, ED: FECAL OCCULT BLD: NEGATIVE

## 2015-10-15 LAB — LACTIC ACID, PLASMA: Lactic Acid, Venous: 1.3 mmol/L (ref 0.5–2.0)

## 2015-10-15 MED ORDER — SERTRALINE HCL 50 MG PO TABS
25.0000 mg | ORAL_TABLET | Freq: Every day | ORAL | Status: DC
  Administered 2015-10-16: 25 mg via ORAL
  Filled 2015-10-15: qty 1

## 2015-10-15 MED ORDER — ACETAMINOPHEN 325 MG PO TABS
650.0000 mg | ORAL_TABLET | Freq: Once | ORAL | Status: AC
  Administered 2015-10-15: 650 mg via ORAL
  Filled 2015-10-15: qty 2

## 2015-10-15 MED ORDER — DOXYCYCLINE HYCLATE 100 MG PO TABS
100.0000 mg | ORAL_TABLET | Freq: Once | ORAL | Status: AC
  Administered 2015-10-15: 100 mg via ORAL
  Filled 2015-10-15: qty 1

## 2015-10-15 MED ORDER — SODIUM CHLORIDE 0.9 % IV BOLUS (SEPSIS)
1000.0000 mL | Freq: Once | INTRAVENOUS | Status: AC
  Administered 2015-10-15: 1000 mL via INTRAVENOUS

## 2015-10-15 MED ORDER — ALBUTEROL SULFATE (2.5 MG/3ML) 0.083% IN NEBU
5.0000 mg | INHALATION_SOLUTION | Freq: Once | RESPIRATORY_TRACT | Status: AC
  Administered 2015-10-15: 5 mg via RESPIRATORY_TRACT
  Filled 2015-10-15: qty 6

## 2015-10-15 MED ORDER — ACETAMINOPHEN 650 MG RE SUPP
650.0000 mg | Freq: Four times a day (QID) | RECTAL | Status: DC | PRN
Start: 2015-10-15 — End: 2015-10-16

## 2015-10-15 MED ORDER — RISPERIDONE 0.5 MG PO TABS
0.5000 mg | ORAL_TABLET | Freq: Two times a day (BID) | ORAL | Status: DC
  Administered 2015-10-16: 0.5 mg via ORAL
  Filled 2015-10-15: qty 1

## 2015-10-15 MED ORDER — DOXYCYCLINE HYCLATE 100 MG PO TABS
100.0000 mg | ORAL_TABLET | Freq: Two times a day (BID) | ORAL | Status: DC
  Administered 2015-10-16: 100 mg via ORAL
  Filled 2015-10-15: qty 1

## 2015-10-15 MED ORDER — APIXABAN 5 MG PO TABS
5.0000 mg | ORAL_TABLET | Freq: Two times a day (BID) | ORAL | Status: DC
  Administered 2015-10-16: 5 mg via ORAL
  Filled 2015-10-15 (×2): qty 1

## 2015-10-15 MED ORDER — LEVALBUTEROL HCL 0.63 MG/3ML IN NEBU
0.6300 mg | INHALATION_SOLUTION | Freq: Four times a day (QID) | RESPIRATORY_TRACT | Status: DC
  Administered 2015-10-15: 0.63 mg via RESPIRATORY_TRACT
  Filled 2015-10-15: qty 3

## 2015-10-15 MED ORDER — FUROSEMIDE 40 MG PO TABS
40.0000 mg | ORAL_TABLET | Freq: Two times a day (BID) | ORAL | Status: DC
  Administered 2015-10-16: 40 mg via ORAL
  Filled 2015-10-15: qty 1

## 2015-10-15 MED ORDER — DONEPEZIL HCL 5 MG PO TABS
10.0000 mg | ORAL_TABLET | Freq: Every day | ORAL | Status: DC
  Filled 2015-10-15: qty 2

## 2015-10-15 MED ORDER — PREDNISONE 20 MG PO TABS
40.0000 mg | ORAL_TABLET | Freq: Every day | ORAL | Status: DC
  Administered 2015-10-16: 40 mg via ORAL
  Filled 2015-10-15: qty 2

## 2015-10-15 MED ORDER — ONDANSETRON HCL 4 MG/2ML IJ SOLN
4.0000 mg | Freq: Once | INTRAMUSCULAR | Status: AC
  Administered 2015-10-15: 4 mg via INTRAVENOUS
  Filled 2015-10-15: qty 2

## 2015-10-15 MED ORDER — ACETAMINOPHEN 325 MG PO TABS: 650.0000 mg | ORAL_TABLET | Freq: Four times a day (QID) | ORAL | Status: DC | PRN

## 2015-10-15 MED ORDER — IPRATROPIUM BROMIDE 0.02 % IN SOLN
0.5000 mg | Freq: Four times a day (QID) | RESPIRATORY_TRACT | Status: DC
  Administered 2015-10-15: 0.5 mg via RESPIRATORY_TRACT
  Filled 2015-10-15: qty 2.5

## 2015-10-15 MED ORDER — ACETAMINOPHEN 325 MG PO TABS: 650.0000 mg | ORAL_TABLET | Freq: Every day | ORAL | Status: DC | PRN

## 2015-10-15 MED ORDER — PREDNISONE 50 MG PO TABS
60.0000 mg | ORAL_TABLET | Freq: Once | ORAL | Status: AC
  Administered 2015-10-15: 60 mg via ORAL
  Filled 2015-10-15: qty 1

## 2015-10-15 MED ORDER — METOPROLOL SUCCINATE ER 50 MG PO TB24
50.0000 mg | ORAL_TABLET | Freq: Every day | ORAL | Status: DC
  Administered 2015-10-16: 50 mg via ORAL
  Filled 2015-10-15: qty 1

## 2015-10-15 NOTE — ED Notes (Signed)
Pt caregiver reports BLE weakness, cough, unsteady gait since waking up this am around 11am. Pt alert adn oriented to place. Pt denies pain.

## 2015-10-15 NOTE — ED Notes (Signed)
MD at bedside. 

## 2015-10-15 NOTE — ED Provider Notes (Signed)
CSN: [id number]     Arrival date & time 10/15/15  1214 History  By signing my name below, I, Eustaquio Maize, attest that this documentation has been prepared under the direction and in the presence of Pattricia Boss, MD. Electronically Signed: Eustaquio Maize, ED Scribe. 10/15/2015. 12:36 PM.   Chief Complaint  Patient presents with  . Weakness    LEVEL 5 CAVEAT due to dementia  The history is provided by the patient and a caregiver. No language interpreter was used.     HPI Comments: Curtis Lang is a 82 y.o. male with PMHx HTN, HLD, COPD, who presents to the Emergency Department for gradual onset, constant, cough and wheezing that began last night. Caregiver is present with pt is in the ED. She reports that pt is typically dressed and ready to go to breakfast when she arrives. She reports that pt typically dresses himself. He does live with his daughter. This morning when caregiver arrived pt was still in bed and had no appetite. Pt did not eat breakfast this morning. Pt also seemed to have an unsteady gait and was confused. Daughter told caregiver that pt had been coughing and wheezing throughout the night. Caregiver states that pt has not been complaining of anything. Pt is non smoker. Pt was sent to [facility] 1 month ago for aggressive behavior recently. He has been fine since then per caregiver.   PCP - Dr. Hilma Favors Cardiologist - Dr. Harl Bowie  Past Medical History  Diagnosis Date  . Arteriosclerotic cardiovascular disease (ASCVD)     BMS to RCA 1997; cutting ballon for RCA restenosis in 2001; cath 12/10 - 100% RCA L->R collaterals; nl EF; 40% LAD and OM1  . Hypertension   . Hyperlipidemia   . Cerebrovascular disease     bilateral bruits; duplex in 2009 - mild plaque w/o stenosis  . Peripheral vascular disease (HCC)     ABI on 0.87 on left  . Tobacco abuse, in remission     60 pack years; discontinued in 2003  . COPD (chronic obstructive pulmonary disease) ([place])   .  Fasting hyperglycemia   . DDD (degenerative disc disease), cervical   . Muscle discomfort     No improvement after statins temporary discontinued  . Hypercholesteremia   . Dementia   . Atrial fibrillation ([place])   . Lymphoma ([place])     chemotherapy and radiation therapy in 2006; recurrence in 2007  . Squamous cell carcinoma [facility])     s/p Mohs surgery  . Cancer ([place])     b cell lymphoma  . Diffuse large B cell lymphoma ([place]) 03/18/2010  . CHF (congestive heart failure) [facility])    Past Surgical History  Procedure Laterality Date  . Anterior fusion cervical spine  2003    posterior?  . Inguinal hernia repair  1996    Right  . Orif femur fracture  1996  . Mohs surgery    . Colonoscopy  05/16/2011    Procedure: COLONOSCOPY;  Surgeon: Jamesetta So; diverticulosis, sigmoid polypectomy  . Port-a-cath removal Right 03/10/2013    Procedure: REMOVAL PORT-A-CATH;  Surgeon: Scherry Ran, MD;  Location: [facility];  Service: General;  Laterality: Right;  . Pyloric stenosis surgery      infant   Family History  Problem Relation Age of Onset  . Stroke Father     deceased - 69  . Stroke Mother     deceased - 68   . Heart attack Brother 49  .  Heart attack Sister   . Colon cancer Neg Hx    Social History  Substance Use Topics  . Smoking status: Former Smoker -- 1.00 packs/day for 50 years    Types: Cigarettes    Start date: 09/19/1945    Quit date: 06/12/1996  . Smokeless tobacco: Never Used  . Alcohol Use: No    Review of Systems  Unable to perform ROS: Dementia    Allergies  Levaquin  Home Medications   Prior to Admission medications   Medication Sig Start Date End Date Taking? Authorizing Provider  acetaminophen (TYLENOL) 325 MG tablet Take 650 mg by mouth daily as needed for mild pain or moderate pain.    Historical Provider, MD  apixaban (ELIQUIS) 5 MG TABS tablet Take 1 tablet (5 mg total) by mouth 2 (two) times daily. 01/14/15   Arnoldo Lenis, MD  atorvastatin  (LIPITOR) 40 MG tablet Take 0.5 tablets by mouth daily. 05/21/15   Historical Provider, MD  donepezil (ARICEPT) 10 MG tablet Take 10 mg by mouth at bedtime.    Historical Provider, MD  ENSURE (ENSURE) Take 237 mLs by mouth daily as needed (FOR NUTRITIONAL SUPPLEMENT).    Historical Provider, MD  fluticasone (FLONASE) 50 MCG/ACT nasal spray Place 2 sprays into both nostrils as needed for allergies.     Historical Provider, MD  furosemide (LASIX) 40 MG tablet TAKE 1 TABLET BY MOUTH 2 TIMES DAILY. 06/16/15   Arnoldo Lenis, MD  lisinopril (PRINIVIL,ZESTRIL) 40 MG tablet Take 1 tablet (40 mg total) by mouth daily. 08/19/14   Arnoldo Lenis, MD  LORazepam (ATIVAN) 0.5 MG tablet Take 0.5 mg by mouth every 8 (eight) hours as needed for anxiety.     Historical Provider, MD  metoprolol succinate (TOPROL-XL) 50 MG 24 hr tablet Take 50 mg by mouth daily. Take with or immediately following a meal.    Historical Provider, MD  potassium chloride SA (K-DUR,KLOR-CON) 20 MEQ tablet Take 1 tablet (20 mEq total) by mouth daily. Patient taking differently: Take 10 mEq by mouth daily.  01/14/15   Arnoldo Lenis, MD  sertraline (ZOLOFT) 25 MG tablet Take 1 tablet (25 mg total) by mouth daily. 06/16/15   Pieter Partridge, DO   BP 134/59 mmHg  Pulse 80  Temp(Src) 97.5 F (36.4 C) (Oral)  Resp 18  Ht 5\' 11"  (1.803 m)  Wt 175 lb (79.379 kg)  BMI 24.42 kg/m2  SpO2 94%   Physical Exam  Constitutional: He appears well-developed and well-nourished. No distress.  HENT:  Head: Normocephalic and atraumatic.  Right Ear: External ear normal.  Left Ear: External ear normal.  Nose: Nose normal.  Mouth/Throat: Oropharynx is clear and moist.  Eyes: Conjunctivae and EOM are normal. Pupils are equal, round, and reactive to light.  Neck: Normal range of motion. Neck supple.  Cardiovascular: An irregularly irregular rhythm present.  Pulmonary/Chest: Effort normal.  bs decreased bilateral bases, no wheezing noted  Abdominal:  Soft. Bowel sounds are normal.  Musculoskeletal: Normal range of motion. He exhibits no edema.  Neurological: He is alert. He displays normal reflexes. No cranial nerve deficit. He exhibits normal muscle tone. Coordination normal.  Oriented to person, not place or time  Skin: Skin is warm and dry. No erythema.  Nursing note and vitals reviewed.   ED Course  Procedures (including critical care time)  DIAGNOSTIC STUDIES: Oxygen Saturation is 94% on RA, adequate by my interpretation.    COORDINATION OF CARE: 12:35 PM-Discussed treatment plan  with caregiver at bedside and caregiver agreed to plan.   Labs Review Labs Reviewed  CBC WITH DIFFERENTIAL/PLATELET - Abnormal; Notable for the following:    WBC 11.6 (*)    RBC 3.99 (*)    Hemoglobin 12.9 (*)    HCT 38.9 (*)    Neutro Abs 9.7 (*)    All other components within normal limits  COMPREHENSIVE METABOLIC PANEL - Abnormal; Notable for the following:    Glucose, Bld 117 (*)    All other components within normal limits  CULTURE, BLOOD (ROUTINE X 2)  CULTURE, BLOOD (ROUTINE X 2)  URINALYSIS, ROUTINE W REFLEX MICROSCOPIC (NOT AT [facility])  POC OCCULT BLOOD, ED  I-STAT CG4 LACTIC ACID, ED    Imaging Review Dg Chest 2 View  10/15/2015  CLINICAL DATA:  Cough with gait disturbance.  Atrial fibrillation. EXAM: CHEST  2 VIEW COMPARISON:  April 02, 2015 FINDINGS: There is no edema or consolidation. Heart is upper normal in size with pulmonary vascularity within normal limits. There is atherosclerotic calcification in the ascending aorta and aortic arch regions. There are foci of calcification in the anterior descending coronary artery. There are surgical clips in each axilla as well as postoperative change in the lower cervical spine. No adenopathy evident. IMPRESSION: No edema or consolidation. Coronary artery calcification is evident radiographically. Electronically Signed   By: Lowella Grip III M.D.   On: 10/15/2015 13:47   I have  personally reviewed and evaluated these images and lab results as part of my medical decision-making.   EKG Interpretation   Date/Time:  Friday Oct 15 2015 12:59:22 EDT Ventricular Rate:  74 PR Interval:    QRS Duration: 124 QT Interval:  472 QTC Calculation: 524 R Axis:   -48 Text Interpretation:  Atrial fibrillation Left bundle branch block  Baseline wander in lead(s) V2 Confirmed by Audriella Blakeley MD, Andee Poles [id number]) on  10/15/2015 1:31:47 PM      MDM   Final diagnoses:  Febrile illness  Metabolic encephalopathy    82 year old man with dementia, COPD, A. fib on eliquis who presents today with cough and fever and decreased alertness and activity. He is given IV fluids here with workup for infection. Chest x-Solange Emry shows no focal pneumonia and influenza test is negative. Given his history of COPD. He is treated with doxycycline. He has received nebulizer and steroids. Attempted to give oral fluids but patient vomited. He has been sleepy and was too weak to ambulate.   Plan admission for observation and treatment.  Pattricia Boss, MD 10/15/15 2017

## 2015-10-15 NOTE — H&P (Addendum)
TRH H&P   Patient Demographics:    Curtis Lang, is a 82 y.o. male  MRN: [id number]   DOB - 04/22/1931  Admit Date - 10/15/2015  Outpatient Primary MD for the patient is Purvis Kilts, MD  Referring MD/NP/PA: Dr. Jeanell Sparrow   Patient coming from: Home  Chief Complaint  Patient presents with  . Weakness      HPI:    Curtis Lang  is a 82 y.o. male, With history of hypertension, hyperlipidemia, COPD who was brought to the ED with constant cough and wheezing that began last night. No family at bedside so history obtained from the ED physician , epic notes. Patient denies any complaints at this time. Denies chest pain, no shortness of breath no coughing. As per notes patient lives with his daughter, and typically dresses himself. This morning patient will stay in bed and had no appetite. Did not eat breakfast. Seemed unsteady with gait and was confused. And he has been coughing throughout the night. In the ED chest x-ray shows no acute abnormality. He was given 1 dose of doxycycline and prednisone for acute bronchitis.    Review of systems:    Review of systems Unobtainable as patient has history of dementia   With Past History of the following :    Past Medical History  Diagnosis Date  . Arteriosclerotic cardiovascular disease (ASCVD)     BMS to RCA 1997; cutting ballon for RCA restenosis in 2001; cath 12/10 - 100% RCA L->R collaterals; nl EF; 40% LAD and OM1  . Hypertension   . Hyperlipidemia   . Cerebrovascular disease     bilateral bruits; duplex in 2009 - mild plaque w/o stenosis  . Peripheral vascular disease (HCC)     ABI on 0.87 on left  . Tobacco abuse, in remission     60 pack years; discontinued in 2003  . COPD (chronic obstructive pulmonary disease) ([place])   . Fasting hyperglycemia   . DDD (degenerative disc disease), cervical   . Muscle discomfort     No improvement after  statins temporary discontinued  . Hypercholesteremia   . Dementia   . Atrial fibrillation ([place])   . Lymphoma ([place])     chemotherapy and radiation therapy in 2006; recurrence in 2007  . Squamous cell carcinoma [facility])     s/p Mohs surgery  . Cancer ([place])     b cell lymphoma  . Diffuse large B cell lymphoma ([place]) 03/18/2010  . CHF (congestive heart failure) [facility])       Past Surgical History  Procedure Laterality Date  . Anterior fusion cervical spine  2003    posterior?  . Inguinal hernia repair  1996    Right  . Orif femur fracture  1996  . Mohs surgery    . Colonoscopy  05/16/2011    Procedure: COLONOSCOPY;  Surgeon: Jamesetta So; diverticulosis, sigmoid polypectomy  . Port-a-cath removal Right 03/10/2013    Procedure: REMOVAL PORT-A-CATH;  Surgeon: Scherry Ran, MD;  Location: [facility];  Service: General;  Laterality: Right;  . Pyloric stenosis surgery      infant      Social History:     Social History  Substance Use Topics  . Smoking status: Former Smoker -- 1.00 packs/day for 50 years    Types: Cigarettes    Start date: 09/19/1945    Quit date: 06/12/1996  . Smokeless tobacco: Never Used  . Alcohol Use: No     Lives - At home     Family History :     Family History  Problem Relation Age of Onset  . Stroke Father     deceased - 19  . Stroke Mother     deceased - 99   . Heart attack Brother 65  . Heart attack Sister   . Colon cancer Neg Hx       Home Medications:   Prior to Admission medications   Medication Sig Start Date End Date Taking? Authorizing Provider  acetaminophen (TYLENOL) 325 MG tablet Take 650 mg by mouth daily as needed for mild pain or moderate pain.   Yes Historical Provider, MD  apixaban (ELIQUIS) 5 MG TABS tablet Take 1 tablet (5 mg total) by mouth 2 (two) times daily. 01/14/15  Yes Arnoldo Lenis, MD  atorvastatin (LIPITOR) 40 MG tablet Take 0.5 tablets by mouth daily. 05/21/15  Yes Historical Provider, MD  donepezil  (ARICEPT) 10 MG tablet Take 10 mg by mouth at bedtime.   Yes Historical Provider, MD  fluticasone (FLONASE) 50 MCG/ACT nasal spray Place 2 sprays into both nostrils as needed for allergies.    Yes Historical Provider, MD  furosemide (LASIX) 40 MG tablet TAKE 1 TABLET BY MOUTH 2 TIMES DAILY. 06/16/15  Yes Arnoldo Lenis, MD  lisinopril (PRINIVIL,ZESTRIL) 40 MG tablet Take 1 tablet (40 mg total) by mouth daily. 08/19/14  Yes Arnoldo Lenis, MD  metoprolol succinate (TOPROL-XL) 50 MG 24 hr tablet Take 50 mg by mouth daily. Take with or immediately following a meal.   Yes Historical Provider, MD  Nutritional Supplements (ENSURE HIGH PROTEIN) LIQD Take 1 Can by mouth 2 (two) times daily between meals.   Yes Historical Provider, MD  risperiDONE (RISPERDAL M-TABS) 0.5 MG disintegrating tablet Take 0.5 mg by mouth 2 (two) times daily.  09/17/15 10/17/15 Yes Historical Provider, MD  risperiDONE (RISPERDAL) 0.5 MG tablet Take 0.5 mg by mouth 2 (two) times daily.   Yes Historical Provider, MD  sertraline (ZOLOFT) 25 MG tablet Take 1 tablet (25 mg total) by mouth daily. 06/16/15  Yes Pieter Partridge, DO     Allergies:     Allergies  Allergen Reactions  . Levaquin [Levofloxacin] Nausea And Vomiting     Physical Exam:   Vitals  Blood pressure 97/52, pulse 84, temperature 100.8 F (38.2 C), temperature source Rectal, resp. rate 21, height 5\' 11"  (1.803 m), weight 79.379 kg (175 lb), SpO2 92 %.   1. General Frail looking elderly gentleman lying in bed in NAD, cooperative with exam  2.  Awake Alert, Oriented to person not place or time  3. No F.N deficits, ALL C.Nerves Intact, Strength 5/5 all 4 extremities, Sensation intact all 4 extremities, Plantars down going.  4. Ears and Eyes appear Normal, Conjunctivae clear, PERRLA. Moist Oral Mucosa.  5. Supple Neck, No JVD, No cervical lymphadenopathy appriciated, No Carotid Bruits.  6. Symmetrical Chest wall movement, Good air movement bilaterally, scattered  wheezing bilaterally  7. Irregular rhythm,  No  Gallops, Rubs or Murmurs, No Parasternal Heave.  8. Positive Bowel Sounds, Abdomen Soft, No tenderness, No organomegaly appriciated,No rebound -guarding or rigidity.  9.  No Cyanosis, Normal Skin Turgor, No Skin Rash or Bruise.  10. Good muscle tone,  joints appear normal , no effusions, Normal ROM.     Data Review:    CBC  Recent Labs Lab 10/15/15 1255  WBC 11.6*  HGB 12.9*  HCT 38.9*  PLT 239  MCV 97.5  MCH 32.3  MCHC 33.2  RDW 14.1  LYMPHSABS 1.1  MONOABS 0.6  EOSABS 0.2  BASOSABS 0.0   ------------------------------------------------------------------------------------------------------------------  Chemistries   Recent Labs Lab 10/15/15 1255  NA 139  K 3.5  CL 103  CO2 28  GLUCOSE 117*  BUN 13  CREATININE 0.95  CALCIUM 8.9  AST 20  ALT 17  ALKPHOS 88  BILITOT 0.7   ------------------------------------------------------------------------------------------------------------------ estimated creatinine clearance is 62.8 mL/min (by C-G formula based on Cr of 0.95). ------------------------------------------------------------------------------------------------------------------ No results for input(s): TSH, T4TOTAL, T3FREE, THYROIDAB in the last 72 hours.  Invalid input(s): FREET3  Coagulation profile No results for input(s): INR, PROTIME in the last 168 hours. ------------------------------------------------------------------------------------------------------------------- No results for input(s): DDIMER in the last 72 hours. -------------------------------------------------------------------------------------------------------------------  Cardiac Enzymes No results for input(s): CKMB, TROPONINI, MYOGLOBIN in the last 168 hours.  Invalid input(s): CK ------------------------------------------------------------------------------------------------------------------    Component Value Date/Time    BNP 287.0* 06/03/2014 1705     ---------------------------------------------------------------------------------------------------------------  Urinalysis    Component Value Date/Time   COLORURINE YELLOW 10/15/2015 1400   APPEARANCEUR CLEAR 10/15/2015 1400   LABSPEC 1.015 10/15/2015 1400   PHURINE 5.5 10/15/2015 1400   GLUCOSEU NEGATIVE 10/15/2015 1400   HGBUR TRACE* 10/15/2015 1400   BILIRUBINUR NEGATIVE 10/15/2015 1400   KETONESUR NEGATIVE 10/15/2015 1400   PROTEINUR NEGATIVE 10/15/2015 1400   UROBILINOGEN 0.2 04/02/2015 1510   NITRITE NEGATIVE 10/15/2015 1400   LEUKOCYTESUR NEGATIVE 10/15/2015 1400    ----------------------------------------------------------------------------------------------------------------   Imaging Results:    Dg Chest 2 View  10/15/2015  CLINICAL DATA:  Cough with gait disturbance.  Atrial fibrillation. EXAM: CHEST  2 VIEW COMPARISON:  April 02, 2015 FINDINGS: There is no edema or consolidation. Heart is upper normal in size with pulmonary vascularity within normal limits. There is atherosclerotic calcification in the ascending aorta and aortic arch regions. There are foci of calcification in the anterior descending coronary artery. There are surgical clips in each axilla as well as postoperative change in the lower cervical spine. No adenopathy evident. IMPRESSION: No edema or consolidation. Coronary artery calcification is evident radiographically. Electronically Signed   By: Lowella Grip III M.D.   On: 10/15/2015 13:47   Ct Head Wo Contrast  10/15/2015  CLINICAL DATA:  82 year old male with altered mental status and confusion. EXAM: CT HEAD WITHOUT CONTRAST TECHNIQUE: Contiguous axial images were obtained from the base of the skull through the vertex without intravenous contrast. COMPARISON:  04/02/2015 MR and prior studies. FINDINGS: Atrophy, chronic small-vessel white matter ischemic changes and inferior right frontal encephalomalacia again  identified. No acute intracranial abnormalities are identified, including mass lesion or mass effect, hydrocephalus, extra-axial fluid collection, midline shift, hemorrhage, or acute infarction. The visualized bony calvarium is unremarkable. IMPRESSION: No evidence of acute intracranial abnormality. Atrophy, chronic small-vessel white matter ischemic changes and inferior right frontal encephalomalacia. Electronically Signed   By: Margarette Canada M.D.   On: 10/15/2015 17:12    My personal review of EKG: Atrial fibrillation Rate  102* /min, QTc 479 , no  Acute ST changes   Assessment & Plan:    Active Problems:   Essential hypertension   A-fib (HCC)   Dementia   Fever   Acute bronchitis     1. Acute bronchitis- we'll admit the patient under observation for acute bronchitis, continue doxycycline 100 mg twice a day. Prednisone 40 mg daily. Xopenex and ipratropium nebulizer every 6 hours.  2. Atrial fibrillation- heart rate is controlled, continue metoprolol, anticoagulation with eliquis. 3. Dementia- no behavioral disturbance at this time, continue Risperdal, Zoloft, Aricept. 4. History of CHF- As per echocardiogram from March 2017, patient has systolic dysfunction. Continue Lasix 40 mg by mouth twice a day. Continue lisinopril daily.    DVT prophylaxis- Apixaban  AM Labs Ordered, also please review Full Orders  Family Communication: No family at bedside  Code Status Full code  Likely DC to Be decided  Admission status: Observation   Time spent in minutes : 55 minutes   Micholas Drumwright S M.D on 10/15/2015 at 8:56 PM  Between 7am to 7pm - Pager - [phone]. After 7pm go to [email or web address] - password [email or web address]  [facility] - Office  [phone]

## 2015-10-15 NOTE — ED Notes (Signed)
Gave patient a sprite to drink as per doctor. Patient very sleepy

## 2015-10-16 DIAGNOSIS — J209 Acute bronchitis, unspecified: Secondary | ICD-10-CM | POA: Diagnosis not present

## 2015-10-16 DIAGNOSIS — R509 Fever, unspecified: Secondary | ICD-10-CM | POA: Diagnosis not present

## 2015-10-16 LAB — COMPREHENSIVE METABOLIC PANEL
ALBUMIN: 3.4 g/dL — AB (ref 3.5–5.0)
ALT: 15 U/L — ABNORMAL LOW (ref 17–63)
ANION GAP: 12 (ref 5–15)
AST: 21 U/L (ref 15–41)
Alkaline Phosphatase: 76 U/L (ref 38–126)
BILIRUBIN TOTAL: 0.8 mg/dL (ref 0.3–1.2)
BUN: 19 mg/dL (ref 6–20)
CALCIUM: 8.8 mg/dL — AB (ref 8.9–10.3)
CO2: 26 mmol/L (ref 22–32)
Chloride: 103 mmol/L (ref 101–111)
Creatinine, Ser: 0.99 mg/dL (ref 0.61–1.24)
GFR calc Af Amer: >60 mL/min (ref >60)
GFR calc non Af Amer: >60 mL/min (ref >60)
GLUCOSE: 172 mg/dL — AB (ref 65–99)
POTASSIUM: 3.7 mmol/L (ref 3.5–5.1)
SODIUM: 141 mmol/L (ref 135–145)
TOTAL PROTEIN: 7.4 g/dL (ref 6.5–8.1)

## 2015-10-16 LAB — CBC
HEMATOCRIT: 37.2 % — AB (ref 39.0–52.0)
Hemoglobin: 12.2 g/dL — ABNORMAL LOW (ref 13.0–17.0)
MCH: 32 pg (ref 26.0–34.0)
MCHC: 32.8 g/dL (ref 30.0–36.0)
MCV: 97.6 fL (ref 78.0–100.0)
PLATELETS: 248 10*3/uL (ref 150–400)
RBC: 3.81 MIL/uL — ABNORMAL LOW (ref 4.22–5.81)
RDW: 14.2 % (ref 11.5–15.5)
WBC: 8 10*3/uL (ref 4.0–10.5)

## 2015-10-16 MED ORDER — LEVALBUTEROL HCL 0.63 MG/3ML IN NEBU
0.6300 mg | INHALATION_SOLUTION | Freq: Four times a day (QID) | RESPIRATORY_TRACT | Status: DC
  Administered 2015-10-16: 0.63 mg via RESPIRATORY_TRACT
  Filled 2015-10-16 (×2): qty 3

## 2015-10-16 MED ORDER — IPRATROPIUM BROMIDE 0.02 % IN SOLN
0.5000 mg | Freq: Four times a day (QID) | RESPIRATORY_TRACT | Status: DC
  Administered 2015-10-16: 0.5 mg via RESPIRATORY_TRACT
  Filled 2015-10-16 (×2): qty 2.5

## 2015-10-16 MED ORDER — PREDNISONE 10 MG PO TABS: 10.0000 mg | ORAL_TABLET | Freq: Every day | ORAL | Status: DC

## 2015-10-16 NOTE — Discharge Summary (Signed)
Physician Discharge Summary  Curtis Lang [id number] DOB: 14-Jul-1930 DOA: 10/15/2015  PCP: Purvis Kilts, MD  Admit date: 10/15/2015 Discharge date: 10/16/2015  Time spent: 45 minutes  Recommendations for Outpatient Follow-up:  -Will be discharged home today. -Advised to follow-up with primary care provider in 2 weeks.   Discharge Diagnoses:  Active Problems:   Essential hypertension   A-fib (HCC)   Dementia   Fever   Acute bronchitis   Discharge Condition: Stable and improved  Filed Weights   10/15/15 1222  Weight: 79.379 kg (175 lb)    History of present illness:  As per Dr. Darrick Meigs on 5/5: Curtis Lang is a 82 y.o. male, With history of hypertension, hyperlipidemia, COPD who was brought to the ED with constant cough and wheezing that began last night. No family at bedside so history obtained from the ED physician , epic notes. Patient denies any complaints at this time. Denies chest pain, no shortness of breath no coughing. As per notes patient lives with his daughter, and typically dresses himself. This morning patient will stay in bed and had no appetite. Did not eat breakfast. Seemed unsteady with gait and was confused. And he has been coughing throughout the night. In the ED chest x-ray shows no acute abnormality. He was given 1 dose of doxycycline and prednisone for acute bronchitis.   Hospital Course:   Acute bronchitis -Symptomatically resolved, no shortness of breath, no chest pain, no coughing. -Do not believe further antibiotics are required. -Since high-dose steroids were initiated in the hospital, will elect to discharge home on a prednisone taper as to not discontinue steroids abruptly.  Rest of chronic conditions have been stable.  Procedures:  None   Consultations:  None  Discharge Instructions  Discharge Instructions    Diet - low sodium heart healthy    Complete by:  As directed      Increase activity slowly    Complete by:  As  directed             Medication List    TAKE these medications        acetaminophen 325 MG tablet  Commonly known as:  TYLENOL  Take 650 mg by mouth daily as needed for mild pain or moderate pain.     apixaban 5 MG Tabs tablet  Commonly known as:  ELIQUIS  Take 1 tablet (5 mg total) by mouth 2 (two) times daily.     atorvastatin 40 MG tablet  Commonly known as:  LIPITOR  Take 0.5 tablets by mouth daily.     donepezil 10 MG tablet  Commonly known as:  ARICEPT  Take 10 mg by mouth at bedtime.     ENSURE HIGH PROTEIN Liqd  Take 1 Can by mouth 2 (two) times daily between meals.     fluticasone 50 MCG/ACT nasal spray  Commonly known as:  FLONASE  Place 2 sprays into both nostrils as needed for allergies.     furosemide 40 MG tablet  Commonly known as:  LASIX  TAKE 1 TABLET BY MOUTH 2 TIMES DAILY.     lisinopril 40 MG tablet  Commonly known as:  PRINIVIL,ZESTRIL  Take 1 tablet (40 mg total) by mouth daily.     metoprolol succinate 50 MG 24 hr tablet  Commonly known as:  TOPROL-XL  Take 50 mg by mouth daily. Take with or immediately following a meal.     predniSONE 10 MG tablet  Commonly known as:  DELTASONE  Take 1 tablet (10 mg total) by mouth daily with breakfast. Take 6 tablets today and then decrease by 1 tablet daily until none are left.     risperiDONE 0.5 MG disintegrating tablet  Commonly known as:  RISPERDAL M-TABS  Take 0.5 mg by mouth 2 (two) times daily.     sertraline 25 MG tablet  Commonly known as:  ZOLOFT  Take 1 tablet (25 mg total) by mouth daily.       Allergies  Allergen Reactions  . Levaquin [Levofloxacin] Nausea And Vomiting       Follow-up Information    Follow up with Purvis Kilts, MD. Schedule an appointment as soon as possible for a visit in 2 weeks.   Specialty:  Family Medicine   Contact information:   [address] [place] [id number] [phone]        The results of significant diagnostics from this  hospitalization (including imaging, microbiology, ancillary and laboratory) are listed below for reference.    Significant Diagnostic Studies: Dg Chest 2 View  10/15/2015  CLINICAL DATA:  Cough with gait disturbance.  Atrial fibrillation. EXAM: CHEST  2 VIEW COMPARISON:  April 02, 2015 FINDINGS: There is no edema or consolidation. Heart is upper normal in size with pulmonary vascularity within normal limits. There is atherosclerotic calcification in the ascending aorta and aortic arch regions. There are foci of calcification in the anterior descending coronary artery. There are surgical clips in each axilla as well as postoperative change in the lower cervical spine. No adenopathy evident. IMPRESSION: No edema or consolidation. Coronary artery calcification is evident radiographically. Electronically Signed   By: Lowella Grip III M.D.   On: 10/15/2015 13:47   Ct Head Wo Contrast  10/15/2015  CLINICAL DATA:  82 year old male with altered mental status and confusion. EXAM: CT HEAD WITHOUT CONTRAST TECHNIQUE: Contiguous axial images were obtained from the base of the skull through the vertex without intravenous contrast. COMPARISON:  04/02/2015 MR and prior studies. FINDINGS: Atrophy, chronic small-vessel white matter ischemic changes and inferior right frontal encephalomalacia again identified. No acute intracranial abnormalities are identified, including mass lesion or mass effect, hydrocephalus, extra-axial fluid collection, midline shift, hemorrhage, or acute infarction. The visualized bony calvarium is unremarkable. IMPRESSION: No evidence of acute intracranial abnormality. Atrophy, chronic small-vessel white matter ischemic changes and inferior right frontal encephalomalacia. Electronically Signed   By: Margarette Canada M.D.   On: 10/15/2015 17:12    Microbiology: Recent Results (from the past 240 hour(s))  Blood culture (routine x 2)     Status: None (Preliminary result)   Collection Time: 10/15/15  12:55 PM  Result Value Ref Range Status   Specimen Description BLOOD LEFT ARM  Final   Special Requests BOTTLES DRAWN AEROBIC AND ANAEROBIC 7 CC EACH  Final   Culture NO GROWTH < 24 HOURS  Final   Report Status PENDING  Incomplete  Blood culture (routine x 2)     Status: None (Preliminary result)   Collection Time: 10/15/15  1:51 PM  Result Value Ref Range Status   Specimen Description BLOOD LEFT ARM  Final   Special Requests BOTTLES DRAWN AEROBIC ONLY 5 CC  Final   Culture NO GROWTH < 24 HOURS  Final   Report Status PENDING  Incomplete     Labs: Basic Metabolic Panel:  Recent Labs Lab 10/15/15 1255 10/16/15 0640  NA 139 141  K 3.5 3.7  CL 103 103  CO2 28 26  GLUCOSE 117* 172*  BUN  13 19  CREATININE 0.95 0.99  CALCIUM 8.9 8.8*   Liver Function Tests:  Recent Labs Lab 10/15/15 1255 10/16/15 0640  AST 20 21  ALT 17 15*  ALKPHOS 88 76  BILITOT 0.7 0.8  PROT 7.6 7.4  ALBUMIN 3.7 3.4*   No results for input(s): LIPASE, AMYLASE in the last 168 hours. No results for input(s): AMMONIA in the last 168 hours. CBC:  Recent Labs Lab 10/15/15 1255 10/16/15 0640  WBC 11.6* 8.0  NEUTROABS 9.7*  --   HGB 12.9* 12.2*  HCT 38.9* 37.2*  MCV 97.5 97.6  PLT 239 248   Cardiac Enzymes: No results for input(s): CKTOTAL, CKMB, CKMBINDEX, TROPONINI in the last 168 hours. BNP: BNP (last 3 results) No results for input(s): BNP in the last 8760 hours.  ProBNP (last 3 results) No results for input(s): PROBNP in the last 8760 hours.  CBG: No results for input(s): GLUCAP in the last 168 hours.     SignedLelon Frohlich  [facility] Pager: [phone] 10/16/2015, 3:36 PM

## 2015-10-17 ENCOUNTER — Telehealth (HOSPITAL_BASED_OUTPATIENT_CLINIC_OR_DEPARTMENT_OTHER): Payer: Self-pay

## 2015-10-17 LAB — BLOOD CULTURE ID PANEL (REFLEXED)
ACINETOBACTER BAUMANNII: NOT DETECTED
CANDIDA ALBICANS: NOT DETECTED
CARBAPENEM RESISTANCE: NOT DETECTED
Candida glabrata: NOT DETECTED
Candida krusei: NOT DETECTED
Candida parapsilosis: NOT DETECTED
Candida tropicalis: NOT DETECTED
ENTEROBACTER CLOACAE COMPLEX: NOT DETECTED
ENTEROBACTERIACEAE SPECIES: NOT DETECTED
ENTEROCOCCUS SPECIES: NOT DETECTED
Escherichia coli: NOT DETECTED
HAEMOPHILUS INFLUENZAE: NOT DETECTED
Klebsiella oxytoca: NOT DETECTED
Klebsiella pneumoniae: NOT DETECTED
Listeria monocytogenes: NOT DETECTED
METHICILLIN RESISTANCE: NOT DETECTED
NEISSERIA MENINGITIDIS: NOT DETECTED
PSEUDOMONAS AERUGINOSA: NOT DETECTED
Proteus species: NOT DETECTED
STAPHYLOCOCCUS AUREUS BCID: NOT DETECTED
STAPHYLOCOCCUS SPECIES: NOT DETECTED
STREPTOCOCCUS AGALACTIAE: NOT DETECTED
STREPTOCOCCUS SPECIES: NOT DETECTED
Serratia marcescens: NOT DETECTED
Streptococcus pneumoniae: NOT DETECTED
Streptococcus pyogenes: NOT DETECTED
VANCOMYCIN RESISTANCE: NOT DETECTED

## 2015-10-17 NOTE — Telephone Encounter (Signed)
Gram + Cocci  In aerobic bottle called from Lab. Pt discharged 10/16/15. Chart take to EDP for review.

## 2015-10-17 NOTE — Telephone Encounter (Addendum)
Pt with gram + cocci in blood culture. Called to Dr. Jerilee Hoh

## 2015-10-19 LAB — CULTURE, BLOOD (ROUTINE X 2)

## 2015-10-20 LAB — CULTURE, BLOOD (ROUTINE X 2): Culture: NO GROWTH

## 2015-10-22 ENCOUNTER — Ambulatory Visit: Payer: 59 | Admitting: Neurology

## 2015-11-08 ENCOUNTER — Inpatient Hospital Stay (HOSPITAL_COMMUNITY)
Admission: EM | Admit: 2015-11-08 | Discharge: 2015-11-12 | DRG: 470 | Disposition: A | Discharge status: Skilled Nursing Facility | Payer: Medicare Other | Attending: Internal Medicine | Admitting: Internal Medicine

## 2015-11-08 ENCOUNTER — Emergency Department (HOSPITAL_COMMUNITY): Payer: Medicare Other

## 2015-11-08 ENCOUNTER — Encounter (HOSPITAL_COMMUNITY): Payer: Self-pay | Admitting: Emergency Medicine

## 2015-11-08 DIAGNOSIS — Z9221 Personal history of antineoplastic chemotherapy: Secondary | ICD-10-CM | POA: Diagnosis not present

## 2015-11-08 DIAGNOSIS — E7800 Pure hypercholesterolemia, unspecified: Secondary | ICD-10-CM | POA: Diagnosis present

## 2015-11-08 DIAGNOSIS — D62 Acute posthemorrhagic anemia: Secondary | ICD-10-CM | POA: Diagnosis not present

## 2015-11-08 DIAGNOSIS — I739 Peripheral vascular disease, unspecified: Secondary | ICD-10-CM | POA: Diagnosis present

## 2015-11-08 DIAGNOSIS — F0281 Dementia in other diseases classified elsewhere with behavioral disturbance: Secondary | ICD-10-CM | POA: Diagnosis present

## 2015-11-08 DIAGNOSIS — I4891 Unspecified atrial fibrillation: Secondary | ICD-10-CM | POA: Diagnosis not present

## 2015-11-08 DIAGNOSIS — Z79899 Other long term (current) drug therapy: Secondary | ICD-10-CM

## 2015-11-08 DIAGNOSIS — Z87891 Personal history of nicotine dependence: Secondary | ICD-10-CM

## 2015-11-08 DIAGNOSIS — F329 Major depressive disorder, single episode, unspecified: Secondary | ICD-10-CM | POA: Diagnosis present

## 2015-11-08 DIAGNOSIS — I255 Ischemic cardiomyopathy: Secondary | ICD-10-CM | POA: Diagnosis present

## 2015-11-08 DIAGNOSIS — E785 Hyperlipidemia, unspecified: Secondary | ICD-10-CM | POA: Diagnosis present

## 2015-11-08 DIAGNOSIS — I10 Essential (primary) hypertension: Secondary | ICD-10-CM | POA: Diagnosis present

## 2015-11-08 DIAGNOSIS — Z981 Arthrodesis status: Secondary | ICD-10-CM

## 2015-11-08 DIAGNOSIS — I110 Hypertensive heart disease with heart failure: Secondary | ICD-10-CM | POA: Diagnosis present

## 2015-11-08 DIAGNOSIS — Z881 Allergy status to other antibiotic agents status: Secondary | ICD-10-CM

## 2015-11-08 DIAGNOSIS — R05 Cough: Secondary | ICD-10-CM | POA: Insufficient documentation

## 2015-11-08 DIAGNOSIS — C8590 Non-Hodgkin lymphoma, unspecified, unspecified site: Secondary | ICD-10-CM | POA: Diagnosis present

## 2015-11-08 DIAGNOSIS — Z66 Do not resuscitate: Secondary | ICD-10-CM | POA: Diagnosis present

## 2015-11-08 DIAGNOSIS — E860 Dehydration: Secondary | ICD-10-CM | POA: Diagnosis present

## 2015-11-08 DIAGNOSIS — Z955 Presence of coronary angioplasty implant and graft: Secondary | ICD-10-CM | POA: Diagnosis not present

## 2015-11-08 DIAGNOSIS — Z96649 Presence of unspecified artificial hip joint: Secondary | ICD-10-CM | POA: Insufficient documentation

## 2015-11-08 DIAGNOSIS — I679 Cerebrovascular disease, unspecified: Secondary | ICD-10-CM | POA: Diagnosis present

## 2015-11-08 DIAGNOSIS — Z01810 Encounter for preprocedural cardiovascular examination: Secondary | ICD-10-CM

## 2015-11-08 DIAGNOSIS — S72009A Fracture of unspecified part of neck of unspecified femur, initial encounter for closed fracture: Secondary | ICD-10-CM | POA: Diagnosis present

## 2015-11-08 DIAGNOSIS — G309 Alzheimer's disease, unspecified: Secondary | ICD-10-CM | POA: Diagnosis present

## 2015-11-08 DIAGNOSIS — Z823 Family history of stroke: Secondary | ICD-10-CM

## 2015-11-08 DIAGNOSIS — D72829 Elevated white blood cell count, unspecified: Secondary | ICD-10-CM | POA: Diagnosis present

## 2015-11-08 DIAGNOSIS — N179 Acute kidney failure, unspecified: Secondary | ICD-10-CM | POA: Diagnosis present

## 2015-11-08 DIAGNOSIS — F0390 Unspecified dementia without behavioral disturbance: Secondary | ICD-10-CM | POA: Diagnosis not present

## 2015-11-08 DIAGNOSIS — W1839XA Other fall on same level, initial encounter: Secondary | ICD-10-CM | POA: Diagnosis present

## 2015-11-08 DIAGNOSIS — Z923 Personal history of irradiation: Secondary | ICD-10-CM

## 2015-11-08 DIAGNOSIS — I2510 Atherosclerotic heart disease of native coronary artery without angina pectoris: Secondary | ICD-10-CM | POA: Diagnosis present

## 2015-11-08 DIAGNOSIS — I5022 Chronic systolic (congestive) heart failure: Secondary | ICD-10-CM | POA: Diagnosis present

## 2015-11-08 DIAGNOSIS — I119 Hypertensive heart disease without heart failure: Secondary | ICD-10-CM | POA: Diagnosis present

## 2015-11-08 DIAGNOSIS — R059 Cough, unspecified: Secondary | ICD-10-CM

## 2015-11-08 DIAGNOSIS — Z7901 Long term (current) use of anticoagulants: Secondary | ICD-10-CM | POA: Diagnosis not present

## 2015-11-08 DIAGNOSIS — R0602 Shortness of breath: Secondary | ICD-10-CM | POA: Insufficient documentation

## 2015-11-08 DIAGNOSIS — I482 Chronic atrial fibrillation: Secondary | ICD-10-CM | POA: Diagnosis present

## 2015-11-08 DIAGNOSIS — S72001A Fracture of unspecified part of neck of right femur, initial encounter for closed fracture: Principal | ICD-10-CM | POA: Diagnosis present

## 2015-11-08 DIAGNOSIS — J449 Chronic obstructive pulmonary disease, unspecified: Secondary | ICD-10-CM | POA: Diagnosis present

## 2015-11-08 DIAGNOSIS — F0151 Vascular dementia with behavioral disturbance: Secondary | ICD-10-CM | POA: Diagnosis present

## 2015-11-08 HISTORY — DX: Chronic systolic (congestive) heart failure: I50.22

## 2015-11-08 HISTORY — DX: Ischemic cardiomyopathy: I25.5

## 2015-11-08 HISTORY — DX: Hypertensive heart disease without heart failure: I11.9

## 2015-11-08 HISTORY — DX: Fracture of unspecified part of neck of right femur, initial encounter for closed fracture: S72.001A

## 2015-11-08 LAB — CBC WITH DIFFERENTIAL/PLATELET
Basophils Absolute: 0 10*3/uL (ref 0.0–0.1)
Basophils Relative: 0 %
EOS PCT: 0 %
Eosinophils Absolute: 0 10*3/uL (ref 0.0–0.7)
HCT: 36.6 % — ABNORMAL LOW (ref 39.0–52.0)
Hemoglobin: 12.2 g/dL — ABNORMAL LOW (ref 13.0–17.0)
LYMPHS ABS: 0.5 10*3/uL — AB (ref 0.7–4.0)
LYMPHS PCT: 3 %
MCH: 32.3 pg (ref 26.0–34.0)
MCHC: 33.3 g/dL (ref 30.0–36.0)
MCV: 96.8 fL (ref 78.0–100.0)
MONO ABS: 0.8 10*3/uL (ref 0.1–1.0)
Monocytes Relative: 5 %
Neutro Abs: 14.1 10*3/uL — ABNORMAL HIGH (ref 1.7–7.7)
Neutrophils Relative %: 92 %
PLATELETS: 199 10*3/uL (ref 150–400)
RBC: 3.78 MIL/uL — AB (ref 4.22–5.81)
RDW: 13.8 % (ref 11.5–15.5)
WBC: 15.3 10*3/uL — AB (ref 4.0–10.5)

## 2015-11-08 LAB — COMPREHENSIVE METABOLIC PANEL
ALT: 27 U/L (ref 17–63)
AST: 38 U/L (ref 15–41)
Albumin: 3.9 g/dL (ref 3.5–5.0)
Alkaline Phosphatase: 78 U/L (ref 38–126)
Anion gap: 12 (ref 5–15)
BUN: 22 mg/dL — ABNORMAL HIGH (ref 6–20)
CALCIUM: 9 mg/dL (ref 8.9–10.3)
CHLORIDE: 101 mmol/L (ref 101–111)
CO2: 25 mmol/L (ref 22–32)
CREATININE: 1.37 mg/dL — AB (ref 0.61–1.24)
GFR, EST AFRICAN AMERICAN: 53 mL/min — AB (ref >60)
GFR, EST NON AFRICAN AMERICAN: 46 mL/min — AB (ref >60)
Glucose, Bld: 185 mg/dL — ABNORMAL HIGH (ref 65–99)
Potassium: 5.1 mmol/L (ref 3.5–5.1)
Sodium: 138 mmol/L (ref 135–145)
TOTAL PROTEIN: 7.7 g/dL (ref 6.5–8.1)
Total Bilirubin: 1.4 mg/dL — ABNORMAL HIGH (ref 0.3–1.2)

## 2015-11-08 LAB — PROTIME-INR
INR: 1.19 (ref 0.00–1.49)
PROTHROMBIN TIME: 15.3 s — AB (ref 11.6–15.2)

## 2015-11-08 MED ORDER — SODIUM CHLORIDE 0.9 % IV BOLUS (SEPSIS)
1000.0000 mL | Freq: Once | INTRAVENOUS | Status: AC
Start: 2015-11-08 — End: 2015-11-09
  Administered 2015-11-08: 1000 mL via INTRAVENOUS

## 2015-11-08 MED ORDER — FENTANYL CITRATE (PF) 100 MCG/2ML IJ SOLN
50.0000 ug | Freq: Once | INTRAMUSCULAR | Status: AC
  Administered 2015-11-08: 50 ug via INTRAVENOUS
  Filled 2015-11-08: qty 2

## 2015-11-08 NOTE — ED Notes (Signed)
Pt c/o right upper leg pain from fall this am. Pt has history of dementia.

## 2015-11-08 NOTE — H&P (Signed)
History and Physical    Curtis Lang DOB: 1930/07/22 DOA: 11/08/2015  Referring MD/NP/PA: Merrily Pew, MD.  PCP: Purvis Kilts, MD  Outpatient Specialists: Hematology and oncology: Dr.Neijstrom Neurosurgery; Dr. Carloyn Manner  Patient coming from: Home via EMS   Chief Complaint: Right hip pain   HPI: Curtis Lang is a 82 y.o. male with medical history significant of ASCVD, HTN, HLD, atrial fibrillation with chronic anticoagulation of Eliquis and mixed type dementia  presented with complaints of right upper leg s/p fall that occurred this morning. Per daughter Butch Penny, this morning when ambulating to bathroom he fell and urinated on himself. She denies LOC.  He has been on Eliquis for afib.     ED Course: Xray of the hip revealed displaced right femoral neck fracture.   Review of Systems: As per HPI otherwise 10 point review of systems negative.    Past Medical History  Diagnosis Date  . Arteriosclerotic cardiovascular disease (ASCVD)     BMS to RCA 1997; cutting ballon for RCA restenosis in 2001; cath 12/10 - 100% RCA L->R collaterals; nl EF; 40% LAD and OM1  . Hypertension   . Hyperlipidemia   . Cerebrovascular disease     bilateral bruits; duplex in 2009 - mild plaque w/o stenosis  . Peripheral vascular disease (HCC)     ABI on 0.87 on left  . Tobacco abuse, in remission     60 pack years; discontinued in 2003  . COPD (chronic obstructive pulmonary disease) ([place])   . Fasting hyperglycemia   . DDD (degenerative disc disease), cervical   . Muscle discomfort     No improvement after statins temporary discontinued  . Hypercholesteremia   . Dementia   . Atrial fibrillation ([place])   . Lymphoma ([place])     chemotherapy and radiation therapy in 2006; recurrence in 2007  . Squamous cell carcinoma [facility])     s/p Mohs surgery  . Cancer ([place])     b cell lymphoma  . Diffuse large B cell lymphoma ([place]) 03/18/2010  . CHF (congestive heart failure) [facility])     Past Surgical  History  Procedure Laterality Date  . Anterior fusion cervical spine  2003    posterior?  . Inguinal hernia repair  1996    Right  . Orif femur fracture  1996  . Mohs surgery    . Colonoscopy  05/16/2011    Procedure: COLONOSCOPY;  Surgeon: Jamesetta So; diverticulosis, sigmoid polypectomy  . Port-a-cath removal Right 03/10/2013    Procedure: REMOVAL PORT-A-CATH;  Surgeon: Scherry Ran, MD;  Location: [facility];  Service: General;  Laterality: Right;  . Pyloric stenosis surgery      infant     reports that he quit smoking about 19 years ago. His smoking use included Cigarettes. He started smoking about 70 years ago. He has a 50 pack-year smoking history. He has never used smokeless tobacco. He reports that he does not drink alcohol or use illicit drugs.  Allergies  Allergen Reactions  . Levaquin [Levofloxacin] Nausea And Vomiting    Family History  Problem Relation Age of Onset  . Stroke Father     deceased - 86  . Stroke Mother     deceased - 40   . Heart attack Brother 82  . Heart attack Sister   . Colon cancer Neg Hx    Unacceptable: Noncontributory, unremarkable, or negative. Acceptable: Family history reviewed and not pertinent (If you reviewed it)  Prior to Admission  medications   Medication Sig Start Date End Date Taking? Authorizing Provider  acetaminophen (TYLENOL) 325 MG tablet Take 650 mg by mouth daily as needed for mild pain or moderate pain.    Historical Provider, MD  apixaban (ELIQUIS) 5 MG TABS tablet Take 1 tablet (5 mg total) by mouth 2 (two) times daily. 01/14/15   Arnoldo Lenis, MD  atorvastatin (LIPITOR) 40 MG tablet Take 0.5 tablets by mouth daily. 05/21/15   Historical Provider, MD  donepezil (ARICEPT) 10 MG tablet Take 10 mg by mouth at bedtime.    Historical Provider, MD  fluticasone (FLONASE) 50 MCG/ACT nasal spray Place 2 sprays into both nostrils as needed for allergies.     Historical Provider, MD  furosemide (LASIX) 40 MG tablet TAKE 1  TABLET BY MOUTH 2 TIMES DAILY. 06/16/15   Arnoldo Lenis, MD  lisinopril (PRINIVIL,ZESTRIL) 40 MG tablet Take 1 tablet (40 mg total) by mouth daily. 08/19/14   Arnoldo Lenis, MD  metoprolol succinate (TOPROL-XL) 50 MG 24 hr tablet Take 50 mg by mouth daily. Take with or immediately following a meal.    Historical Provider, MD  Nutritional Supplements (ENSURE HIGH PROTEIN) LIQD Take 1 Can by mouth 2 (two) times daily between meals.    Historical Provider, MD  predniSONE (DELTASONE) 10 MG tablet Take 1 tablet (10 mg total) by mouth daily with breakfast. Take 6 tablets today and then decrease by 1 tablet daily until none are left. 10/16/15   Erline Hau, MD  risperiDONE (RISPERDAL M-TABS) 0.5 MG disintegrating tablet Take 0.5 mg by mouth 2 (two) times daily.  09/17/15 10/17/15  Historical Provider, MD  sertraline (ZOLOFT) 25 MG tablet Take 1 tablet (25 mg total) by mouth daily. 06/16/15   Pieter Partridge, DO    Physical Exam: Filed Vitals:   11/08/15 2101  BP: 112/67  Pulse: 94  Temp: 97.5 F (36.4 C)  Resp: 20  Height: 6' (1.829 m)  Weight: 77.111 kg (170 lb)  SpO2: 95%      Constitutional: NAD, calm, comfortable Filed Vitals:   11/08/15 2101  BP: 112/67  Pulse: 94  Temp: 97.5 F (36.4 C)  Resp: 20  Height: 6' (1.829 m)  Weight: 77.111 kg (170 lb)  SpO2: 95%   Eyes: PERRL, lids and conjunctivae normal Respiratory: clear to auscultation bilaterally, no wheezing, no crackles. Normal respiratory effort. No accessory muscle use.  Cardiovascular: Regular rate and rhythm, no murmurs / rubs / gallops. No extremity edema. 2+ pedal pulses. No carotid bruits.  Abdomen: no tenderness, no masses palpated. No hepatosplenomegaly. Bowel sounds positive.  Musculoskeletal: no clubbing / cyanosis. No joint deformity upper and lower extremities. Good ROM, no contractures. Normal muscle tone.  Neurologic: CN 2-12 grossly intact. Sensation intact, DTR normal. Strength 5/5 in all 4.    Psychiatric:  Alert but confused.  Normal mood.   Labs on Admission: I have personally reviewed following labs and imaging studies  Urine analysis:    Component Value Date/Time   COLORURINE YELLOW 10/15/2015 1400   APPEARANCEUR CLEAR 10/15/2015 1400   LABSPEC 1.015 10/15/2015 1400   PHURINE 5.5 10/15/2015 1400   GLUCOSEU NEGATIVE 10/15/2015 1400   HGBUR TRACE* 10/15/2015 1400   BILIRUBINUR NEGATIVE 10/15/2015 1400   KETONESUR NEGATIVE 10/15/2015 1400   PROTEINUR NEGATIVE 10/15/2015 1400   UROBILINOGEN 0.2 04/02/2015 1510   NITRITE NEGATIVE 10/15/2015 1400   LEUKOCYTESUR NEGATIVE 10/15/2015 1400   Sepsis Labs: @LABRCNTIP (procalcitonin:4,lacticidven:4) )No results found for this or  any previous visit (from the past 240 hour(s)).   Radiological Exams on Admission: Dg Pelvis 1-2 Views  11/08/2015  CLINICAL DATA:  Right leg pain post unwitnessed fall this morning. EXAM: PELVIS - 1-2 VIEW COMPARISON:  None. FINDINGS: There is a displaced right femoral neck fracture. Mild proximal migration of the femoral shaft. No additional fracture of the bony pelvis. Left hip is seated in the acetabulum. Left inguinal calcification is unchanged from CT 03/13/2013. Vascular calcifications are seen. IMPRESSION: Displaced right femoral neck fracture. Electronically Signed   By: Jeb Levering M.D.   On: 11/08/2015 22:16   Ct Head Wo Contrast  11/08/2015  CLINICAL DATA:  Fall this morning. Dementia patient, does not remember the fall. EXAM: CT HEAD WITHOUT CONTRAST TECHNIQUE: Contiguous axial images were obtained from the base of the skull through the vertex without intravenous contrast. COMPARISON:  Head CT 10/15/2015 FINDINGS: No intracranial hemorrhage, mass effect, or midline shift. Moderate cerebral and cerebellar atrophy, unchanged. Mild chronic small vessel ischemia. Remote right frontal lobe infarct with encephalomalacia. No hydrocephalus. The basilar cisterns are patent. No evidence of territorial  infarct. No intracranial fluid collection. Atherosclerosis of skullbase vasculature. Calvarium is intact. Included paranasal sinuses and mastoid air cells are well aerated. IMPRESSION: 1.  No acute intracranial abnormality. 2. Stable atrophy and chronic ischemic change. Electronically Signed   By: Jeb Levering M.D.   On: 11/08/2015 22:00   Dg Femur, Min 2 Views Right  11/08/2015  CLINICAL DATA:  Right leg pain post unwitnessed fall this morning. EXAM: RIGHT FEMUR 2 VIEWS COMPARISON:  None. FINDINGS: There is a displaced right femoral neck fracture with mild proximal migration of the femoral shaft. The distal femur is intact. Femoral head remains seated in the acetabulum. Dense vascular calcifications are seen. IMPRESSION: Displaced right femoral neck fracture.  Distal femur is intact. Electronically Signed   By: Jeb Levering M.D.   On: 11/08/2015 22:17    Assessment/Plan   1. Displaced right femoral neck s/p mechanical fall. Patient will be transferred to [facility] for orthopedic consult and surgery. Dr Sharol Given was consulted by EDP.  Eliquis half life is 12 hours, so will d/c Eliquis, and start SubQ heparin tomorrow.  Will likely hold ORIF for 2 days prior to surgery, but will defer to ortho.  Temporary making NPO after midnight.  Ad[facility] to Dr Lara Mulch at [facility].  2. Chronic systolic CHF. Stable. No evidence of volume overload.   Will give KVO with D5NS.  3. Atrial fibrillation. Chronic anticoagulation with Eliquis. Eliquis held.  See above.  4. PVD. Stable.  5. Dementia.   DVT prophylaxis: sub Q Heparin to start tomorrow.  Code Status: DNR.  Family Communication: Daughter, Dawna bedside. Disposition Plan: Anticipate discharge within 1-2 days.  Consults called: Orthopedics  Admission status: Observation  Orvan Falconer MD  FACP.  [facility]  If 7PM-7AM, please contact night-coverage [email or web address] Password [email or web address]  11/08/2015, 10:40 PM    By signing my name below, I, Rennis Harding, attest  that this documentation has been prepared under the direction and in the presence of Orvan Falconer, MD. Electronically signed: Rennis Harding, Scribe. 11/08/2015   I, Dr. Orvan Falconer, personally performed the services described in this documentation. All medical record entries made by the scribe were at my discretion and in my presence. Orvan Falconer, MD 11/08/2015

## 2015-11-08 NOTE — ED Provider Notes (Signed)
CSN: [id number]     Arrival date & time 11/08/15  2059 History  By signing my name below, I, Emmanuella Mensah, attest that this documentation has been prepared under the direction and in the presence of Merrily Pew, MD. Electronically Signed: Judithann Sauger, ED Scribe. 11/08/2015. 9:12 PM.     Chief Complaint  Patient presents with  . Fall   The history is provided by the EMS personnel. No language interpreter was used.   HPI Comments: Level 5 Caveat due to Dementia Curtis Lang is a 82 y.o. male with a hx of dementia, ASCVD, HTN, HLD, and CHF brought in by ambulance, who presents to the Emergency Department for evaluation of right upper leg s/p fall that occurred this am. Per EMS, pt had an unwitnessed fall this am in the hallway at home with pt urinating on himself. Pt is unable to explain mechanism of fall or when the fall took place. He denies any other pain.    Past Medical History  Diagnosis Date  . Arteriosclerotic cardiovascular disease (ASCVD)     BMS to RCA 1997; cutting ballon for RCA restenosis in 2001; cath 12/10 - 100% RCA L->R collaterals; nl EF; 40% LAD and OM1  . Hypertension   . Hyperlipidemia   . Cerebrovascular disease     bilateral bruits; duplex in 2009 - mild plaque w/o stenosis  . Peripheral vascular disease (HCC)     ABI on 0.87 on left  . Tobacco abuse, in remission     60 pack years; discontinued in 2003  . COPD (chronic obstructive pulmonary disease) ([place])   . Fasting hyperglycemia   . DDD (degenerative disc disease), cervical   . Muscle discomfort     No improvement after statins temporary discontinued  . Hypercholesteremia   . Dementia   . Atrial fibrillation ([place])   . Lymphoma ([place])     chemotherapy and radiation therapy in 2006; recurrence in 2007  . Squamous cell carcinoma [facility])     s/p Mohs surgery  . Cancer ([place])     b cell lymphoma  . Diffuse large B cell lymphoma ([place]) 03/18/2010  . CHF (congestive heart failure) [facility])    Past  Surgical History  Procedure Laterality Date  . Anterior fusion cervical spine  2003    posterior?  . Inguinal hernia repair  1996    Right  . Orif femur fracture  1996  . Mohs surgery    . Colonoscopy  05/16/2011    Procedure: COLONOSCOPY;  Surgeon: Jamesetta So; diverticulosis, sigmoid polypectomy  . Port-a-cath removal Right 03/10/2013    Procedure: REMOVAL PORT-A-CATH;  Surgeon: Scherry Ran, MD;  Location: [facility];  Service: General;  Laterality: Right;  . Pyloric stenosis surgery      infant   Family History  Problem Relation Age of Onset  . Stroke Father     deceased - 36  . Stroke Mother     deceased - 34   . Heart attack Brother 38  . Heart attack Sister   . Colon cancer Neg Hx    Social History  Substance Use Topics  . Smoking status: Former Smoker -- 1.00 packs/day for 50 years    Types: Cigarettes    Start date: 09/19/1945    Quit date: 06/12/1996  . Smokeless tobacco: Never Used  . Alcohol Use: No    Review of Systems  Unable to perform ROS: Dementia      Allergies  Levaquin  Home Medications  Prior to Admission medications   Medication Sig Start Date End Date Taking? Authorizing Provider  acetaminophen (TYLENOL) 325 MG tablet Take 650 mg by mouth daily as needed for mild pain or moderate pain.    Historical Provider, MD  apixaban (ELIQUIS) 5 MG TABS tablet Take 1 tablet (5 mg total) by mouth 2 (two) times daily. 01/14/15   Arnoldo Lenis, MD  atorvastatin (LIPITOR) 40 MG tablet Take 0.5 tablets by mouth daily. 05/21/15   Historical Provider, MD  donepezil (ARICEPT) 10 MG tablet Take 10 mg by mouth at bedtime.    Historical Provider, MD  fluticasone (FLONASE) 50 MCG/ACT nasal spray Place 2 sprays into both nostrils as needed for allergies.     Historical Provider, MD  furosemide (LASIX) 40 MG tablet TAKE 1 TABLET BY MOUTH 2 TIMES DAILY. 06/16/15   Arnoldo Lenis, MD  lisinopril (PRINIVIL,ZESTRIL) 40 MG tablet Take 1 tablet (40 mg total) by  mouth daily. 08/19/14   Arnoldo Lenis, MD  metoprolol succinate (TOPROL-XL) 50 MG 24 hr tablet Take 50 mg by mouth daily. Take with or immediately following a meal.    Historical Provider, MD  Nutritional Supplements (ENSURE HIGH PROTEIN) LIQD Take 1 Can by mouth 2 (two) times daily between meals.    Historical Provider, MD  predniSONE (DELTASONE) 10 MG tablet Take 1 tablet (10 mg total) by mouth daily with breakfast. Take 6 tablets today and then decrease by 1 tablet daily until none are left. 10/16/15   Erline Hau, MD  risperiDONE (RISPERDAL M-TABS) 0.5 MG disintegrating tablet Take 0.5 mg by mouth 2 (two) times daily.  09/17/15 10/17/15  Historical Provider, MD  sertraline (ZOLOFT) 25 MG tablet Take 1 tablet (25 mg total) by mouth daily. 06/16/15   Pieter Partridge, DO   BP 112/67 mmHg  Pulse 94  Temp(Src) 97.5 F (36.4 C)  Resp 20  Ht 6' (1.829 m)  Wt 170 lb (77.111 kg)  BMI 23.05 kg/m2  SpO2 95% Physical Exam  Constitutional: He is oriented to person, place, and time. He appears well-developed and well-nourished.  HENT:  Head: Normocephalic and atraumatic.  Cardiovascular: Normal rate.   Pulmonary/Chest: Effort normal.  Musculoskeletal:  RU femur tenderness, pain in same area with ROM No evidence of trauma in BLE, neck, head, chest, abdomen No pain in C, T or L spine Intact DP pulse on the right  Neurological: He is alert and oriented to person, place, and time.  Skin: Skin is warm and dry.  Psychiatric: He has a normal mood and affect.  Nursing note and vitals reviewed.   ED Course  Procedures (including critical care time) DIAGNOSTIC STUDIES: Oxygen Saturation is 95% on RA, normal by my interpretation.    COORDINATION OF CARE: 9:11 PM- Pt will receive x-ray for further evaluation.   9:14 PM - Spoke to pt's family on phone and they are on their way to ED. Will speak to them on arrival.    Labs Review Labs Reviewed  CBC WITH DIFFERENTIAL/PLATELET - Abnormal;  Notable for the following:    WBC 15.3 (*)    RBC 3.78 (*)    Hemoglobin 12.2 (*)    HCT 36.6 (*)    Neutro Abs 14.1 (*)    Lymphs Abs 0.5 (*)    All other components within normal limits  COMPREHENSIVE METABOLIC PANEL - Abnormal; Notable for the following:    Glucose, Bld 185 (*)    BUN 22 (*)  Creatinine, Ser 1.37 (*)    Total Bilirubin 1.4 (*)    GFR calc non Af Amer 46 (*)    GFR calc Af Amer 53 (*)    All other components within normal limits  PROTIME-INR - Abnormal; Notable for the following:    Prothrombin Time 15.3 (*)    All other components within normal limits  CBC - Abnormal; Notable for the following:    WBC 12.9 (*)    RBC 3.61 (*)    Hemoglobin 11.4 (*)    HCT 35.3 (*)    All other components within normal limits  BASIC METABOLIC PANEL - Abnormal; Notable for the following:    Glucose, Bld 151 (*)    Creatinine, Ser 1.36 (*)    Calcium 8.6 (*)    GFR calc non Af Amer 46 (*)    GFR calc Af Amer 54 (*)    All other components within normal limits  SURGICAL PCR SCREEN    Imaging Review Dg Pelvis 1-2 Views  11/08/2015  CLINICAL DATA:  Right leg pain post unwitnessed fall this morning. EXAM: PELVIS - 1-2 VIEW COMPARISON:  None. FINDINGS: There is a displaced right femoral neck fracture. Mild proximal migration of the femoral shaft. No additional fracture of the bony pelvis. Left hip is seated in the acetabulum. Left inguinal calcification is unchanged from CT 03/13/2013. Vascular calcifications are seen. IMPRESSION: Displaced right femoral neck fracture. Electronically Signed   By: Jeb Levering M.D.   On: 11/08/2015 22:16   Ct Head Wo Contrast  11/08/2015  CLINICAL DATA:  Fall this morning. Dementia patient, does not remember the fall. EXAM: CT HEAD WITHOUT CONTRAST TECHNIQUE: Contiguous axial images were obtained from the base of the skull through the vertex without intravenous contrast. COMPARISON:  Head CT 10/15/2015 FINDINGS: No intracranial hemorrhage,  mass effect, or midline shift. Moderate cerebral and cerebellar atrophy, unchanged. Mild chronic small vessel ischemia. Remote right frontal lobe infarct with encephalomalacia. No hydrocephalus. The basilar cisterns are patent. No evidence of territorial infarct. No intracranial fluid collection. Atherosclerosis of skullbase vasculature. Calvarium is intact. Included paranasal sinuses and mastoid air cells are well aerated. IMPRESSION: 1.  No acute intracranial abnormality. 2. Stable atrophy and chronic ischemic change. Electronically Signed   By: Jeb Levering M.D.   On: 11/08/2015 22:00   Dg Femur, Min 2 Views Right  11/08/2015  CLINICAL DATA:  Right leg pain post unwitnessed fall this morning. EXAM: RIGHT FEMUR 2 VIEWS COMPARISON:  None. FINDINGS: There is a displaced right femoral neck fracture with mild proximal migration of the femoral shaft. The distal femur is intact. Femoral head remains seated in the acetabulum. Dense vascular calcifications are seen. IMPRESSION: Displaced right femoral neck fracture.  Distal femur is intact. Electronically Signed   By: Jeb Levering M.D.   On: 11/08/2015 22:17     Merrily Pew, MD has personally reviewed and evaluated these images and lab results as part of his medical decision-making.   EKG Interpretation   Date/Time:  Monday Nov 08 2015 23:01:09 EDT Ventricular Rate:  117 PR Interval:  179 QRS Duration: 124 QT Interval:  383 QTC Calculation: 534 R Axis:   -66 Text Interpretation:  Sinus tachycardia with irregular rate Nonspecific  IVCD with LAD Inferior infarct, old Anterior infarct, old No significant  change since last tracing Confirmed by [facility], [place] MD, Corene Cornea [phone]) on  11/09/2015 6:36:22 PM      MDM   Final diagnoses:  Hip fracture, right, closed, initial  encounter [facility])    82 year old gentleman who has severe dementia presents after a fall sometime this morning has been walking but with significant pain in his right upper extremity.  His exam is relatively benign except for pain with range of motion of his right extremity x-ray showed a right femoral neck fracture. Discussed case with Dr. Jeanett Schlein at [facility] and Dr. Marin Comment at [facility] and [facility] for [facility] for [facility] and likely definitive repair tomorrow.  I personally performed the services described in this documentation, which was scribed in my presence. The recorded information has been reviewed and is accurate.   Merrily Pew, MD 11/09/15 [phone]

## 2015-11-09 ENCOUNTER — Encounter (HOSPITAL_COMMUNITY): Payer: Self-pay | Admitting: Nurse Practitioner

## 2015-11-09 ENCOUNTER — Inpatient Hospital Stay (HOSPITAL_COMMUNITY): Payer: Medicare Other

## 2015-11-09 DIAGNOSIS — I255 Ischemic cardiomyopathy: Secondary | ICD-10-CM | POA: Diagnosis present

## 2015-11-09 DIAGNOSIS — I119 Hypertensive heart disease without heart failure: Secondary | ICD-10-CM | POA: Diagnosis present

## 2015-11-09 DIAGNOSIS — I4891 Unspecified atrial fibrillation: Secondary | ICD-10-CM | POA: Diagnosis present

## 2015-11-09 DIAGNOSIS — Z01810 Encounter for preprocedural cardiovascular examination: Secondary | ICD-10-CM

## 2015-11-09 DIAGNOSIS — E785 Hyperlipidemia, unspecified: Secondary | ICD-10-CM | POA: Diagnosis present

## 2015-11-09 DIAGNOSIS — I2510 Atherosclerotic heart disease of native coronary artery without angina pectoris: Secondary | ICD-10-CM

## 2015-11-09 DIAGNOSIS — E7800 Pure hypercholesterolemia, unspecified: Secondary | ICD-10-CM | POA: Diagnosis present

## 2015-11-09 LAB — BASIC METABOLIC PANEL
ANION GAP: 9 (ref 5–15)
BUN: 18 mg/dL (ref 6–20)
CALCIUM: 8.6 mg/dL — AB (ref 8.9–10.3)
CHLORIDE: 107 mmol/L (ref 101–111)
CO2: 23 mmol/L (ref 22–32)
Creatinine, Ser: 1.36 mg/dL — ABNORMAL HIGH (ref 0.61–1.24)
GFR calc non Af Amer: 46 mL/min — ABNORMAL LOW (ref >60)
GFR, EST AFRICAN AMERICAN: 54 mL/min — AB (ref >60)
Glucose, Bld: 151 mg/dL — ABNORMAL HIGH (ref 65–99)
POTASSIUM: 4.5 mmol/L (ref 3.5–5.1)
Sodium: 139 mmol/L (ref 135–145)

## 2015-11-09 LAB — CBC
HEMATOCRIT: 35.3 % — AB (ref 39.0–52.0)
HEMOGLOBIN: 11.4 g/dL — AB (ref 13.0–17.0)
MCH: 31.6 pg (ref 26.0–34.0)
MCHC: 32.3 g/dL (ref 30.0–36.0)
MCV: 97.8 fL (ref 78.0–100.0)
Platelets: 186 10*3/uL (ref 150–400)
RBC: 3.61 MIL/uL — AB (ref 4.22–5.81)
RDW: 14 % (ref 11.5–15.5)
WBC: 12.9 10*3/uL — AB (ref 4.0–10.5)

## 2015-11-09 LAB — SURGICAL PCR SCREEN
MRSA, PCR: NEGATIVE
STAPHYLOCOCCUS AUREUS: NEGATIVE

## 2015-11-09 MED ORDER — DONEPEZIL HCL 23 MG PO TABS
23.0000 mg | ORAL_TABLET | ORAL | Status: DC
  Administered 2015-11-09 – 2015-11-11 (×2): 23 mg via ORAL
  Filled 2015-11-09 (×2): qty 1

## 2015-11-09 MED ORDER — HYDROMORPHONE HCL 1 MG/ML IJ SOLN
0.5000 mg | INTRAMUSCULAR | Status: DC | PRN
  Administered 2015-11-11: 0.5 mg via INTRAVENOUS
  Filled 2015-11-09: qty 1

## 2015-11-09 MED ORDER — RISPERIDONE 0.5 MG PO TBDP
0.5000 mg | ORAL_TABLET | Freq: Two times a day (BID) | ORAL | Status: DC
  Administered 2015-11-09 – 2015-11-12 (×6): 0.5 mg via ORAL
  Filled 2015-11-09 (×7): qty 1

## 2015-11-09 MED ORDER — METOPROLOL SUCCINATE ER 50 MG PO TB24
50.0000 mg | ORAL_TABLET | Freq: Every day | ORAL | Status: DC
  Administered 2015-11-09 – 2015-11-12 (×4): 50 mg via ORAL
  Filled 2015-11-09 (×4): qty 1

## 2015-11-09 MED ORDER — HEPARIN SODIUM (PORCINE) 5000 UNIT/ML IJ SOLN
5000.0000 [IU] | Freq: Three times a day (TID) | INTRAMUSCULAR | Status: DC
  Administered 2015-11-09 (×3): 5000 [IU] via SUBCUTANEOUS
  Filled 2015-11-09 (×3): qty 1

## 2015-11-09 MED ORDER — DEXTROSE-NACL 5-0.9 % IV SOLN
INTRAVENOUS | Status: DC
  Administered 2015-11-09: 02:00:00 via INTRAVENOUS

## 2015-11-09 MED ORDER — ATORVASTATIN CALCIUM 20 MG PO TABS
20.0000 mg | ORAL_TABLET | Freq: Every day | ORAL | Status: DC
Start: 2015-11-09 — End: 2015-11-12
  Administered 2015-11-09 – 2015-11-11 (×4): 20 mg via ORAL
  Filled 2015-11-09 (×4): qty 1

## 2015-11-09 MED ORDER — DONEPEZIL HCL 10 MG PO TABS
10.0000 mg | ORAL_TABLET | Freq: Once | ORAL | Status: AC
  Administered 2015-11-09: 10 mg via ORAL
  Filled 2015-11-09: qty 1

## 2015-11-09 MED ORDER — LISINOPRIL 40 MG PO TABS
40.0000 mg | ORAL_TABLET | Freq: Every day | ORAL | Status: DC
  Administered 2015-11-09 – 2015-11-12 (×4): 40 mg via ORAL
  Filled 2015-11-09 (×4): qty 1

## 2015-11-09 MED ORDER — SERTRALINE HCL 25 MG PO TABS
25.0000 mg | ORAL_TABLET | Freq: Every day | ORAL | Status: DC
Start: 2015-11-09 — End: 2015-11-12
  Administered 2015-11-09 – 2015-11-12 (×4): 25 mg via ORAL
  Filled 2015-11-09 (×4): qty 1

## 2015-11-09 MED ORDER — ACETAMINOPHEN 325 MG PO TABS
650.0000 mg | ORAL_TABLET | Freq: Every day | ORAL | Status: DC | PRN
  Administered 2015-11-09 (×2): 650 mg via ORAL
  Filled 2015-11-09 (×2): qty 2

## 2015-11-09 MED ORDER — DONEPEZIL HCL 10 MG PO TABS
10.0000 mg | ORAL_TABLET | ORAL | Status: DC
  Administered 2015-11-10: 10 mg via ORAL
  Filled 2015-11-09 (×2): qty 1

## 2015-11-09 MED ORDER — ONDANSETRON HCL 4 MG PO TABS: 4.0000 mg | ORAL_TABLET | Freq: Four times a day (QID) | ORAL | Status: DC | PRN

## 2015-11-09 MED ORDER — ONDANSETRON HCL 4 MG/2ML IJ SOLN: 4.0000 mg | Freq: Four times a day (QID) | INTRAMUSCULAR | Status: DC | PRN

## 2015-11-09 MED ORDER — DONEPEZIL HCL 10 MG PO TABS: 10.0000 mg | ORAL_TABLET | Freq: Every day | ORAL | Status: DC

## 2015-11-09 NOTE — H&P (Signed)
Patient unable to finish medical history

## 2015-11-09 NOTE — Consult Note (Signed)
Cardiology Consult    Patient ID: Curtis Lang MRN: [id number], DOB/AGE: 08-13-1930   Admit date: 11/08/2015 Date of Consult: 11/09/2015  Primary Physician: Curtis Kilts, MD Primary Cardiologist: Curtis Abts, MD  Requesting Provider: Eather Colas, MD  Patient Profile    82 y/o ? with a h/o CAD, ICM, and chronic AFib on eliquis, whom we've been asked to eval for preoperative cardiac risk assessment following fall and right femoral neck fracture.  Past Medical History   Past Medical History  Diagnosis Date  . Arteriosclerotic cardiovascular disease (ASCVD)     a. BMS to RCA 1997; b. cutting ballon for RCA restenosis in 2001; c. 05/2009 Cath: LM 20, LAD 20p, 40m, 65d (1.33mm vessel), D1 min irregs, LCX small, nl, OM1 40, OM2 min irregs, RCA dominant, 50p, 112m. The dRCA, RPDA, & RPL fill via L --> R collaterals. EF 55%.   Marland Kitchen Hypertensive heart disease   . Hyperlipidemia   . Cerebrovascular disease     a. bilateral bruits-->duplex in 2009 - mild plaque w/o stenosis; b. 11/2013 Carotid U/S: 1-39% bilat ICA stenosis. Antegrade vertebral flow.  . Peripheral vascular disease (HCC)     a. ABI on 0.87 on left  . Tobacco abuse, in remission     60 pack years; discontinued in 2003  . COPD (chronic obstructive pulmonary disease) ([place])   . Fasting hyperglycemia   . DDD (degenerative disc disease), cervical   . Muscle discomfort     No improvement after statins temporary discontinued  . Hypercholesteremia   . Dementia   . Atrial fibrillation (HCC)     a. CHA2DS2VASc = 5-->Eliquis.  . Squamous cell carcinoma ([place])     a. s/p Mohs surgery  . Diffuse large B cell lymphoma ([place])     a. chemotherapy and radiation therapy in 2006; recurrence in 2007  . Chronic systolic CHF (congestive heart failure) ([place])     a. 05/2013 Echo: EF 25 - 30%; b. 08/2015 Echo: EF 45%, diff HK, mild AS/AI, triv MR, mildly dil LA, mild TR, PASP 67mmHg.  . Ischemic cardiomyopathy     a. 05/2013 Echo: EF 25-30%; b.  08/2015 Echo: EF 45%, diff HK.    Past Surgical History  Procedure Laterality Date  . Anterior fusion cervical spine  2003    posterior?  . Inguinal hernia repair  1996    Right  . Orif femur fracture  1996  . Mohs surgery    . Colonoscopy  05/16/2011    Procedure: COLONOSCOPY;  Surgeon: Curtis Lang; diverticulosis, sigmoid polypectomy  . Port-a-cath removal Right 03/10/2013    Procedure: REMOVAL PORT-A-CATH;  Surgeon: Curtis Ran, MD;  Location: [facility];  Service: General;  Laterality: Right;  . Pyloric stenosis surgery      infant     Allergies  Allergies  Allergen Reactions  . Levaquin [Levofloxacin] Nausea And Vomiting    History of Present Illness    82 y/o ? with a h/o CAD s/p PCI and BMS to the RCA in 1997 followed by cutting balloon angioplasty for ISR in 2001.  He had an abnormal myoview in 2010, leading to cath, which revealed an occluded RCA with L  R collaterals, and otw non-obs dzs in his left coronary system.  EF was nl @ that time but in 2014, he was found to have LV dysfxn on echo with an EF of 25 - 30%.  He was medically managed/conservatively in the setting of advancing dementia, co-morbidities,  and apparent absence of symptoms.  Most recent echo in 08/2015 showed some improvement of EF to 45%.  He also has a h/o HTN, HL, non-obstructive carotid dzs, COPD, chronic afib on eliquis, and B Cell lymphoma s/p chemo and radiation.    At baseline, he has been relatively active.  He lives in [place] and his dtr has been living with him over the past two years secondary to advancing dementia.  His dtr prepares his medications and meals.  Earlier this year, [name] was walking frequently, sometimes leaving home and wandering around [place].  He was hospitalized in late March secondary to aggressive behavior and worsening dementia.  He was admitted to behavioral health @ [facility], where his meds were adjusted and he was d/c'd in early April.  Since then, he has been more  calm and also less active, still walking some but less frequently and less far.  His dtr says that he never complains of chest pain or dyspnea.  He is relatively stable on his feet but sometimes needs to use a walker.  He did fall in early March w/o incident and was also admitted to [facility] in early May with bronchitis, and d/c'd the following day.  He was in his usual state of health until until the morning of 5/29.  He was apparently walking down the hallway to use the bathroom and made it into the bathroom but couldn't hold his urine and became incontinent on the floor.  His dtr believes that he probably slipped on his urine and then fell to the floor.  His dtr was sitting on the back porch @ the time but heard him fall and came into the house.  He did c/o right leg pain but otw seemed stable.  She helped him up, cleaned him off, and gave him two tylenol.  He was less active throughout the day than usual, and later in the day, c/o right hip pain.  At that point, they brought him to [facility] where xrays of his right leg revealed a right femoral neck fx.  He was admitted and has been seen by ortho with plan for hemiarthroplasty of the right hip.  We've been asked to eval.  He currently denies c/p or dyspnea.  Dtr @ bedside.  Inpatient Medications    . atorvastatin  20 mg Oral QHS  . [START ON 11/10/2015] donepezil  10 mg Oral QODAY   And  . donepezil  23 mg Oral QODAY  . heparin  5,000 Units Subcutaneous Q8H  . lisinopril  40 mg Oral Daily  . metoprolol succinate  50 mg Oral Daily  . risperiDONE  0.5 mg Oral BID  . sertraline  25 mg Oral Daily    Family History    **Obtained from daughter and epic records.  Pt unable to provide hx 2/2 dementia. Family History  Problem Relation Age of Onset  . Stroke Father     deceased - 87  . Stroke Mother     deceased - 9   . Heart attack Brother 63  . Heart attack Sister   . Colon cancer Neg Hx     Social History    **Obtained from daughter and epic  records.  Pt unable to provide hx 2/2 dementia. Social History   Social History  . Marital Status: Divorced    Spouse Name: N/A  . Number of Children: N/A  . Years of Education: N/A   Occupational History  . Not on file.  Social History Main Topics  . Smoking status: Former Smoker -- 1.00 packs/day for 50 years    Types: Cigarettes    Start date: 09/19/1945    Quit date: 06/12/1996  . Smokeless tobacco: Never Used  . Alcohol Use: No  . Drug Use: No  . Sexual Activity: No   Other Topics Concern  . Not on file   Social History Narrative   Divorced; retired.  His dtr lives with him in [place].       Review of Systems    General:  No chills, fever, night sweats or weight changes.  Cardiovascular:  No chest pain, dyspnea on exertion, edema, orthopnea, palpitations, paroxysmal nocturnal dyspnea. Dermatological: No rash, lesions/masses Respiratory: No cough, dyspnea Urologic: No hematuria, dysuria Abdominal:   No nausea, vomiting, diarrhea, bright red blood per rectum, melena, or hematemesis Neurologic:  +++ dementia.  No visual changes, wkns, changes in mental status. MSK:  Was having right leg pain yesterday per dtr.  Currently denies any pain. All other systems reviewed and are otherwise negative except as noted above.  Physical Exam    Blood pressure 118/68, pulse 88, temperature 98.3 F (36.8 C), temperature source Oral, resp. rate 20, height 6' (1.829 m), weight 170 lb (77.111 kg), SpO2 99 %.  General: Pleasant, NAD Psych: Normal affect. Neuro: Alert and oriented to person only. Moves all extremities spontaneously.  Cooperative with exam. HEENT: Normal  Neck: Supple without bruits or JVD. Lungs:  Resp regular and unlabored, CTA. Heart: IR, IR, no s3, s4, or murmurs. Abdomen: Soft, non-tender, non-distended, BS + x 4.  Extremities: No clubbing, cyanosis or edema. DP/PT/Radials 1+ and equal bilaterally.  Labs    Lab Results  Component Value Date   WBC 12.9*  11/09/2015   HGB 11.4* 11/09/2015   HCT 35.3* 11/09/2015   MCV 97.8 11/09/2015   PLT 186 11/09/2015     Recent Labs Lab 11/08/15 2305 11/09/15 0334  NA 138 139  K 5.1 4.5  CL 101 107  CO2 25 23  BUN 22* 18  CREATININE 1.37* 1.36*  CALCIUM 9.0 8.6*  PROT 7.7  --   BILITOT 1.4*  --   ALKPHOS 78  --   ALT 27  --   AST 38  --   GLUCOSE 185* 151*   Lab Results  Component Value Date   CHOL 203* 11/27/2013   HDL 40 11/27/2013   LDLCALC 136* 11/27/2013   TRIG 134 11/27/2013     Radiology Studies    Dg Chest 2 View  10/15/2015  CLINICAL DATA:  Cough with gait disturbance.  Atrial fibrillation. EXAM: CHEST  2 VIEW COMPARISON:  April 02, 2015 FINDINGS: There is no edema or consolidation. Heart is upper normal in size with pulmonary vascularity within normal limits. There is atherosclerotic calcification in the ascending aorta and aortic arch regions. There are foci of calcification in the anterior descending coronary artery. There are surgical clips in each axilla as well as postoperative change in the lower cervical spine. No adenopathy evident. IMPRESSION: No edema or consolidation. Coronary artery calcification is evident radiographically. Electronically Signed   By: Lowella Grip III M.D.   On: 10/15/2015 13:47   Dg Pelvis 1-2 Views  11/08/2015  CLINICAL DATA:  Right leg pain post unwitnessed fall this morning. EXAM: PELVIS - 1-2 VIEW COMPARISON:  None. FINDINGS: There is a displaced right femoral neck fracture. Mild proximal migration of the femoral shaft. No additional fracture of the bony pelvis. Left hip  is seated in the acetabulum. Left inguinal calcification is unchanged from CT 03/13/2013. Vascular calcifications are seen. IMPRESSION: Displaced right femoral neck fracture. Electronically Signed   By: Jeb Levering M.D.   On: 11/08/2015 22:16   Ct Head Wo Contrast  11/08/2015  CLINICAL DATA:  Fall this morning. Dementia patient, does not remember the fall. EXAM: CT  HEAD WITHOUT CONTRAST TECHNIQUE: Contiguous axial images were obtained from the base of the skull through the vertex without intravenous contrast. COMPARISON:  Head CT 10/15/2015 FINDINGS: No intracranial hemorrhage, mass effect, or midline shift. Moderate cerebral and cerebellar atrophy, unchanged. Mild chronic small vessel ischemia. Remote right frontal lobe infarct with encephalomalacia. No hydrocephalus. The basilar cisterns are patent. No evidence of territorial infarct. No intracranial fluid collection. Atherosclerosis of skullbase vasculature. Calvarium is intact. Included paranasal sinuses and mastoid air cells are well aerated. IMPRESSION: 1.  No acute intracranial abnormality. 2. Stable atrophy and chronic ischemic change. Electronically Signed   By: Jeb Levering M.D.   On: 11/08/2015 22:00   Ct Head Wo Contrast  10/15/2015  CLINICAL DATA:  82 year old male with altered mental status and confusion. EXAM: CT HEAD WITHOUT CONTRAST TECHNIQUE: Contiguous axial images were obtained from the base of the skull through the vertex without intravenous contrast. COMPARISON:  04/02/2015 MR and prior studies. FINDINGS: Atrophy, chronic small-vessel white matter ischemic changes and inferior right frontal encephalomalacia again identified. No acute intracranial abnormalities are identified, including mass lesion or mass effect, hydrocephalus, extra-axial fluid collection, midline shift, hemorrhage, or acute infarction. The visualized bony calvarium is unremarkable. IMPRESSION: No evidence of acute intracranial abnormality. Atrophy, chronic small-vessel white matter ischemic changes and inferior right frontal encephalomalacia. Electronically Signed   By: Margarette Canada M.D.   On: 10/15/2015 17:12   Dg Femur, Min 2 Views Right  11/08/2015  CLINICAL DATA:  Right leg pain post unwitnessed fall this morning. EXAM: RIGHT FEMUR 2 VIEWS COMPARISON:  None. FINDINGS: There is a displaced right femoral neck fracture with  mild proximal migration of the femoral shaft. The distal femur is intact. Femoral head remains seated in the acetabulum. Dense vascular calcifications are seen. IMPRESSION: Displaced right femoral neck fracture.  Distal femur is intact. Electronically Signed   By: Jeb Levering M.D.   On: 11/08/2015 22:17    ECG & Cardiac Imaging    AF, 117, LAD, inferior infarct, LBBB.  Assessment & Plan    1.  Right Femoral Neck Fracture:  S/p mechanical fall with resultant right hip pain and finding of right femoral neck fx on 5/29.  Pending hemiarthroplasty of the right hip.  From a cardiac standpoint, he has done reasonably well, walking relatively frequently @ home without chest pain or dyspnea.  His dtr reports that his volume/wts have been stable @ home as well.  His last dose of eliquis was ~ 24 hrs ago.  He may proceed to surgery without further ischemic evaluation at this time.  He is at least moderate risk for cardiac complications related to his surgery given his co-morbidities and advanced age.  Cont  blocker and statin throughout the perioperative period and use caution with IVF in setting of LV dysfxn.  We will follow along.  2.  CAD:  S/p prior BMS to the RCA in 1997 with occlusion of the RCA noted on cath in 2010 with L  R collaterals and otw stable, non-obstructive disease.  He has been doing well @ home w/o chest pain or dyspnea.  As above, no ischemic  testing required @ this time.  Cont statin,  blocker.  3.  ICM/Chronic systolic CHF:  EF previously as low as 25-30% in 05/2013 with recent improvement noted on echo in 08/2015 (45%).  Euvolemic on exam today.  Cont  blocker and acei.  He is Rx lasix 40 BID @ home, but his dtr has only been giving it to him daily 2/2 poor PO intake and stable wt.  Follow I/O closely.  Will likely need to resume lasix early in the post-operative period.  4.  Chronic Atrial Fibrillation:  CHA2DS2VASc = 5.  On eliquis 5 mg BID @ home.  Last dose was on the morning  of 5/29.  If surgical bleeding risk felt to be high, reasonable to defer for another 24 hrs.  Rates have been elevated in setting of pain/hip fx.  Cont  blocker.  5.  Hypertensive Heart Disease:  Stable on  blocker and acei.  6.  HL:  Cont statin therapy throughout perioperative period.  7.  Dementia:  Per dtr, he has been stable since hospitalization @ [place] in March/April.  Signed, Curtis Hodgkins, NP 11/09/2015, 11:22 AM  As above, patient seen and examined.  Briefly he is an 82 year old male with past medical history of coronary artery disease, ischemic cardiomyopathy, dementia, hyperlipidemia,, COPD, permanent atrial fibrillation for preoperative evaluation prior to hip surgery. Patient recently fell and x-rays have revealed a right femoral neck fracture.  He is confused but denies syncope. He  Is reasonably active prior to this with no dyspnea or chest pain. Electrocardiogram shows atrial fibrillation with left bundle branch block.   Patient will be at at least moderate risk for surgical repair given his age, cardiac history and dementia. However he has not had symptoms and would be a poor candidate  for aggressive cardiac evaluation. He may proceed without further ischemia evaluation. Hold apixaban prior to surgery and resume after when hemostasis achieved. Continue other preadmission meds pre and postoperatively including statin and beta blocker. Follow volume status closely. Resume Lasix postoperatively and discontinue IV fluids when he resumes oral intake. Kirk Ruths

## 2015-11-09 NOTE — Progress Notes (Signed)
PROGRESS NOTE    Curtis Lang  [id number] DOB: April 30, 1931 DOA: 11/08/2015 PCP: Purvis Kilts, MD    Brief Narrative: Curtis Lang is a 82 y.o. male with medical history significant of ASCVD, HTN, HLD, atrial fibrillation with chronic anticoagulation of Eliquis and mixed type dementia presented with a fall, was found to have a right femoral neck fracture.   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  Cardiology consulted pre op clearance.  Pain control and gentle hydration.    Rate controlled atrial fibrillation:  eliquis held for the procedure.  Resume eliquis after the surgery.    Mixed alzhemers and vascular dementia: No agitation or behavioral disturbances.  Resume home meds.    Hypertension: Controlled.   Ischemic cardiomyopathy: No chest pain, or palpitations.  Resume BB and statin.    Leukocytosis< unclear etiology. Recent use of steroids for acute bronchitis.  Afebrile.  Get UA.    Acute kidney injury: Probably from dehydration, pre renal.  Gentle hydration and repeat renal parameters in am.  If renal parameters does not improve, will need further work up with US renal.       DVT prophylaxis:  HEPARIN. Code Status: (DNR Family Communication:none at bedside Disposition Plan: pending PT eval,   Consultants:   Orthopedics  Cardiology.   Procedures: plan for surgical repair of the right femoral neck fracture.    Antimicrobials:  none   Subjective: Denies any new complaints.  Objective: Filed Vitals:   11/09/15 0015 11/09/15 0147 11/09/15 0451 11/09/15 1251  BP:  114/71 118/68 111/64  Pulse:  71 88 52  Temp:  98.1 F (36.7 C) 98.3 F (36.8 C) 98.9 F (37.2 C)  TempSrc:  Oral Oral Oral  Resp:  20 20 20   Height:      Weight:      SpO2: 94% 97% 99% 98%    Intake/Output Summary (Last 24 hours) at 11/09/15 1254 Last data filed at 11/09/15 0700  Gross per 24 hour  Intake    240 ml  Output      0 ml  Net    240 ml   Filed Weights   11/08/15 2101  Weight: 77.111 kg (170 lb)    Examination:  General exam: Appears calm and comfortable  Respiratory system: Clear to auscultation. Respiratory effort normal. Cardiovascular system: S1 & S2 heard, RRR. No JVD, murmurs, rubs, gallops or clicks. No pedal edema. Gastrointestinal system: Abdomen is nondistended, soft and nontender. No organomegaly or masses felt. Normal bowel sounds heard. Central nervous system: Alert and confused. . No focal neurological deficits. Extremities: painful movements of the right lower extremity.  Skin: No rashes, lesions or ulcers     Data Reviewed: I have personally reviewed following labs and imaging studies  CBC:  Recent Labs Lab 11/08/15 2234 11/09/15 0334  WBC 15.3* 12.9*  NEUTROABS 14.1*  --   HGB 12.2* 11.4*  HCT 36.6* 35.3*  MCV 96.8 97.8  PLT 199 [id number]   Basic Metabolic Panel:  Recent Labs Lab 11/08/15 2305 11/09/15 0334  NA 138 139  K 5.1 4.5  CL 101 107  CO2 25 23  GLUCOSE 185* 151*  BUN 22* 18  CREATININE 1.37* 1.36*  CALCIUM 9.0 8.6*   GFR: Estimated Creatinine Clearance: 44.9 mL/min (by C-G formula based on Cr of 1.36). Liver Function Tests:  Recent Labs Lab 11/08/15 2305  AST 38  ALT 27  ALKPHOS 78  BILITOT 1.4*  PROT 7.7  ALBUMIN 3.9   No results for input(s): LIPASE, AMYLASE in the last 168 hours. No results for input(s): AMMONIA in the last 168 hours. Coagulation  Profile:  Recent Labs Lab 11/08/15 2305  INR 1.19   Cardiac Enzymes: No results for input(s): CKTOTAL, CKMB, CKMBINDEX, TROPONINI in the last 168 hours. BNP (last 3 results) No results for input(s): PROBNP in the last 8760 hours. HbA1C: No results for input(s): HGBA1C in the last 72 hours. CBG: No results for input(s): GLUCAP in the last 168 hours. Lipid Profile: No results for input(s): CHOL, HDL, LDLCALC, TRIG, CHOLHDL, LDLDIRECT in the last 72 hours. Thyroid Function Tests: No results for input(s): TSH, T4TOTAL, FREET4, T3FREE, THYROIDAB in the last 72 hours. Anemia Panel: No results for input(s): VITAMINB12, FOLATE, FERRITIN, TIBC, IRON, RETICCTPCT in the last 72 hours. Sepsis Labs: No results for input(s): PROCALCITON, LATICACIDVEN in the last 168 hours.  Recent Results (from the past 240 hour(s))  Surgical pcr screen     Status: None   Collection Time: 11/09/15  1:48 AM  Result Value Ref Range Status   MRSA, PCR NEGATIVE NEGATIVE Final   Staphylococcus aureus NEGATIVE NEGATIVE Final    Comment:        The Xpert SA Assay (FDA approved for NASAL specimens in patients over 19 years of age), is one component of a comprehensive surveillance program.  Test performance has been validated by [facility] for patients greater than or equal to 83 year old. It is not intended to diagnose infection nor to guide or monitor treatment.          Radiology Studies: Dg Pelvis 1-2 Views  11/08/2015  CLINICAL DATA:  Right leg pain post unwitnessed fall this morning. EXAM: PELVIS - 1-2 VIEW COMPARISON:  None. FINDINGS: There is a displaced right femoral neck fracture. Mild proximal migration of the femoral shaft. No additional fracture of the bony pelvis. Left hip is seated in the acetabulum. Left inguinal calcification is unchanged from CT 03/13/2013. Vascular calcifications are seen. IMPRESSION: Displaced right femoral neck fracture. Electronically Signed   By: Jeb Levering  M.D.   On: 11/08/2015 22:16   Ct Head Wo Contrast  11/08/2015  CLINICAL DATA:  Fall this morning. Dementia patient, does not remember the fall. EXAM: CT HEAD WITHOUT CONTRAST TECHNIQUE: Contiguous axial images were obtained from the base of the skull through the vertex without intravenous contrast. COMPARISON:  Head CT 10/15/2015 FINDINGS: No intracranial hemorrhage, mass effect, or midline shift. Moderate cerebral and cerebellar atrophy, unchanged. Mild chronic small vessel ischemia. Remote right frontal lobe infarct with encephalomalacia. No hydrocephalus. The basilar cisterns are patent. No evidence of territorial infarct. No intracranial fluid collection. Atherosclerosis of skullbase vasculature. Calvarium is intact. Included paranasal sinuses and mastoid air cells are well aerated. IMPRESSION: 1.  No acute intracranial abnormality. 2. Stable atrophy and chronic ischemic change. Electronically Signed   By: Jeb Levering M.D.   On: 11/08/2015 22:00   Dg Femur, Min 2 Views Right  11/08/2015  CLINICAL DATA:  Right  leg pain post unwitnessed fall this morning. EXAM: RIGHT FEMUR 2 VIEWS COMPARISON:  None. FINDINGS: There is a displaced right femoral neck fracture with mild proximal migration of the femoral shaft. The distal femur is intact. Femoral head remains seated in the acetabulum. Dense vascular calcifications are seen. IMPRESSION: Displaced right femoral neck fracture.  Distal femur is intact. Electronically Signed   By: Jeb Levering M.D.   On: 11/08/2015 22:17        Scheduled Meds: . atorvastatin  20 mg Oral QHS  . [START ON 11/10/2015] donepezil  10 mg Oral QODAY   And  . donepezil  23 mg Oral QODAY  . heparin  5,000 Units Subcutaneous Q8H  . lisinopril  40 mg Oral Daily  . metoprolol succinate  50 mg Oral Daily  . risperiDONE  0.5 mg Oral BID  . sertraline  25 mg Oral Daily   Continuous Infusions: . dextrose 5 % and 0.9% NaCl 10 mL/hr at 11/09/15 0208     LOS: 1 day     Time spent: 25 minutes.     Hosie Poisson, MD [facility] Pager [phone]  If 7PM-7AM, please contact night-coverage [email or web address] Password [email or web address] 11/09/2015, 12:54 PM

## 2015-11-09 NOTE — Consult Note (Signed)
ORTHOPAEDIC CONSULTATION  REQUESTING PHYSICIAN: Hosie Poisson, MD  Chief Complaint: Right femoral neck fracture  HPI: Curtis Lang is a 82 y.o. male who presents with a right femoral neck fracture.  By report patient fell yesterday morning sustaining the fracture and was walking during the day complaining of pain then went to [facility] emergency room radiographs show the fracture and patient was transferred to [facility] for definitive treatment.  Past Medical History  Diagnosis Date  . Arteriosclerotic cardiovascular disease (ASCVD)     BMS to RCA 1997; cutting ballon for RCA restenosis in 2001; cath 12/10 - 100% RCA L->R collaterals; nl EF; 40% LAD and OM1  . Hypertension   . Hyperlipidemia   . Cerebrovascular disease     bilateral bruits; duplex in 2009 - mild plaque w/o stenosis  . Peripheral vascular disease (HCC)     ABI on 0.87 on left  . Tobacco abuse, in remission     60 pack years; discontinued in 2003  . COPD (chronic obstructive pulmonary disease) ([place])   . Fasting hyperglycemia   . DDD (degenerative disc disease), cervical   . Muscle discomfort     No improvement after statins temporary discontinued  . Hypercholesteremia   . Dementia   . Atrial fibrillation ([place])   . Lymphoma ([place])     chemotherapy and radiation therapy in 2006; recurrence in 2007  . Squamous cell carcinoma [facility])     s/p Mohs surgery  . Cancer ([place])     b cell lymphoma  . Diffuse large B cell lymphoma ([place]) 03/18/2010  . CHF (congestive heart failure) [facility])    Past Surgical History  Procedure Laterality Date  . Anterior fusion cervical spine  2003    posterior?  . Inguinal hernia repair  1996    Right  . Orif femur fracture  1996  . Mohs surgery    . Colonoscopy  05/16/2011    Procedure: COLONOSCOPY;  Surgeon: Jamesetta So; diverticulosis, sigmoid polypectomy  . Port-a-cath removal Right 03/10/2013    Procedure: REMOVAL PORT-A-CATH;  Surgeon: Scherry Ran, MD;  Location: [facility]S;   Service: General;  Laterality: Right;  . Pyloric stenosis surgery      infant   Social History   Social History  . Marital Status: Divorced    Spouse Name: N/A  . Number of Children: N/A  . Years of Education: N/A   Social History Main Topics  . Smoking status: Former Smoker -- 1.00 packs/day for 50 years    Types: Cigarettes    Start date: 09/19/1945    Quit date: 06/12/1996  . Smokeless tobacco: Never Used  . Alcohol Use: No  . Drug Use: No  . Sexual Activity: No   Other Topics Concern  . None   Social History Narrative   Divorced; retired; does not get regular exercise.    Family History  Problem Relation Age of Onset  . Stroke Father     deceased - 69  . Stroke Mother     deceased - 56   . Heart attack Brother 83  . Heart attack Sister   . Colon cancer Neg Hx    - negative except otherwise stated in the family history section Allergies  Allergen Reactions  . Levaquin [Levofloxacin] Nausea And Vomiting   Prior to Admission medications   Medication Sig Start Date End Date Taking? Authorizing Provider  acetaminophen (TYLENOL) 325 MG tablet Take 650 mg by mouth daily as needed for  mild pain or moderate pain.   Yes Historical Provider, MD  apixaban (ELIQUIS) 5 MG TABS tablet Take 1 tablet (5 mg total) by mouth 2 (two) times daily. 01/14/15  Yes Arnoldo Lenis, MD  atorvastatin (LIPITOR) 40 MG tablet Take 0.5 tablets by mouth at bedtime.  05/21/15  Yes Historical Provider, MD  donepezil (ARICEPT) 10 MG tablet Take 10-23 mg by mouth at bedtime.    Yes Historical Provider, MD  furosemide (LASIX) 40 MG tablet TAKE 1 TABLET BY MOUTH 2 TIMES DAILY. 06/16/15  Yes Arnoldo Lenis, MD  lisinopril (PRINIVIL,ZESTRIL) 40 MG tablet Take 1 tablet (40 mg total) by mouth daily. 08/19/14  Yes Arnoldo Lenis, MD  metoprolol succinate (TOPROL-XL) 50 MG 24 hr tablet Take 50 mg by mouth daily. Take with or immediately following a meal.   Yes Historical Provider, MD  Nutritional  Supplements (ENSURE HIGH PROTEIN) LIQD Take 1 Can by mouth 2 (two) times daily between meals.   Yes Historical Provider, MD  risperiDONE (RISPERDAL M-TABS) 0.5 MG disintegrating tablet Take 0.5 mg by mouth 2 (two) times daily. Takes as needed either in the morning or at bedtime. Patient ,may take up to three tabs daily 09/17/15 11/08/15 Yes Historical Provider, MD  sertraline (ZOLOFT) 25 MG tablet Take 1 tablet (25 mg total) by mouth daily. 06/16/15  Yes Adam Telford Nab, DO  fluticasone (FLONASE) 50 MCG/ACT nasal spray Place 2 sprays into both nostrils as needed for allergies.     Historical Provider, MD  predniSONE (DELTASONE) 10 MG tablet Take 1 tablet (10 mg total) by mouth daily with breakfast. Take 6 tablets today and then decrease by 1 tablet daily until none are left. Patient not taking: Reported on 11/08/2015 10/16/15   Erline Hau, MD   Dg Pelvis 1-2 Views  11/08/2015  CLINICAL DATA:  Right leg pain post unwitnessed fall this morning. EXAM: PELVIS - 1-2 VIEW COMPARISON:  None. FINDINGS: There is a displaced right femoral neck fracture. Mild proximal migration of the femoral shaft. No additional fracture of the bony pelvis. Left hip is seated in the acetabulum. Left inguinal calcification is unchanged from CT 03/13/2013. Vascular calcifications are seen. IMPRESSION: Displaced right femoral neck fracture. Electronically Signed   By: Jeb Levering M.D.   On: 11/08/2015 22:16   Ct Head Wo Contrast  11/08/2015  CLINICAL DATA:  Fall this morning. Dementia patient, does not remember the fall. EXAM: CT HEAD WITHOUT CONTRAST TECHNIQUE: Contiguous axial images were obtained from the base of the skull through the vertex without intravenous contrast. COMPARISON:  Head CT 10/15/2015 FINDINGS: No intracranial hemorrhage, mass effect, or midline shift. Moderate cerebral and cerebellar atrophy, unchanged. Mild chronic small vessel ischemia. Remote right frontal lobe infarct with encephalomalacia. No  hydrocephalus. The basilar cisterns are patent. No evidence of territorial infarct. No intracranial fluid collection. Atherosclerosis of skullbase vasculature. Calvarium is intact. Included paranasal sinuses and mastoid air cells are well aerated. IMPRESSION: 1.  No acute intracranial abnormality. 2. Stable atrophy and chronic ischemic change. Electronically Signed   By: Jeb Levering M.D.   On: 11/08/2015 22:00   Dg Femur, Min 2 Views Right  11/08/2015  CLINICAL DATA:  Right leg pain post unwitnessed fall this morning. EXAM: RIGHT FEMUR 2 VIEWS COMPARISON:  None. FINDINGS: There is a displaced right femoral neck fracture with mild proximal migration of the femoral shaft. The distal femur is intact. Femoral head remains seated in the acetabulum. Dense vascular calcifications are seen. IMPRESSION:  Displaced right femoral neck fracture.  Distal femur is intact. Electronically Signed   By: Jeb Levering M.D.   On: 11/08/2015 22:17   - pertinent xrays, CT, MRI studies were reviewed and independently interpreted  Positive ROS: All other systems have been reviewed and were otherwise negative with the exception of those mentioned in the HPI and as above.  Physical Exam: General: Patient has Alzheimer's dementia he is not alert or oriented. Cardiovascular: No pedal edema Respiratory: No cyanosis, no use of accessory musculature GI: No organomegaly, abdomen is soft and non-tender Skin: No lesions in the area of chief complaint Neurologic: Sensation intact distally Psychiatric: Patient is not competent his daughter is with him in the room. Lymphatic: No axillary or cervical lymphadenopathy  MUSCULOSKELETAL:  On examination patient's right lower extremity is shortened and externally rotated. He has pain with attempted range of motion. Radiographs of the entire right femur and pelvis show a displaced right femoral neck fracture.  Assessment: Assessment: Right femoral neck fracture with multiple  medical problems including dementia heart failure cancer lymphoma peripheral vascular disease  Plan: Plan: I would recommend proceeding with hemiarthroplasty of the right hip. Discussed risks with the patient's daughter including increased risk of mortality with the patient's multiple medical problems she states she understands she would like a cardiology consult to evaluate the patient preoperatively. I will plan on surgery once patient is cleared medically.  Thank you for the consult and the opportunity to see Mr. Arbuckle, MD [place] [phone] 6:27 AM

## 2015-11-10 ENCOUNTER — Encounter (HOSPITAL_COMMUNITY)
Admission: EM | Disposition: A | Discharge status: Skilled Nursing Facility | Payer: Self-pay | Source: Home / Self Care | Attending: Internal Medicine

## 2015-11-10 ENCOUNTER — Inpatient Hospital Stay (HOSPITAL_COMMUNITY): Payer: Medicare Other | Admitting: Certified Registered Nurse Anesthetist

## 2015-11-10 ENCOUNTER — Inpatient Hospital Stay (HOSPITAL_COMMUNITY): Payer: Medicare Other

## 2015-11-10 DIAGNOSIS — I10 Essential (primary) hypertension: Secondary | ICD-10-CM

## 2015-11-10 DIAGNOSIS — I5022 Chronic systolic (congestive) heart failure: Secondary | ICD-10-CM

## 2015-11-10 DIAGNOSIS — F0151 Vascular dementia with behavioral disturbance: Secondary | ICD-10-CM

## 2015-11-10 DIAGNOSIS — I482 Chronic atrial fibrillation: Secondary | ICD-10-CM

## 2015-11-10 DIAGNOSIS — E785 Hyperlipidemia, unspecified: Secondary | ICD-10-CM

## 2015-11-10 DIAGNOSIS — S72001A Fracture of unspecified part of neck of right femur, initial encounter for closed fracture: Principal | ICD-10-CM

## 2015-11-10 DIAGNOSIS — F0390 Unspecified dementia without behavioral disturbance: Secondary | ICD-10-CM

## 2015-11-10 DIAGNOSIS — G309 Alzheimer's disease, unspecified: Secondary | ICD-10-CM

## 2015-11-10 HISTORY — PX: HIP ARTHROPLASTY: SHX981

## 2015-11-10 LAB — URINALYSIS, ROUTINE W REFLEX MICROSCOPIC
BILIRUBIN URINE: NEGATIVE
GLUCOSE, UA: NEGATIVE mg/dL
Hgb urine dipstick: NEGATIVE
KETONES UR: NEGATIVE mg/dL
LEUKOCYTES UA: NEGATIVE
NITRITE: NEGATIVE
PH: 5.5 (ref 5.0–8.0)
PROTEIN: NEGATIVE mg/dL
Specific Gravity, Urine: 1.019 (ref 1.005–1.030)

## 2015-11-10 LAB — GLUCOSE, CAPILLARY: Glucose-Capillary: 116 mg/dL — ABNORMAL HIGH (ref 65–99)

## 2015-11-10 SURGERY — HEMIARTHROPLASTY, HIP, DIRECT ANTERIOR APPROACH, FOR FRACTURE
Anesthesia: General | Laterality: Right

## 2015-11-10 MED ORDER — CEFAZOLIN SODIUM-DEXTROSE 2-3 GM-% IV SOLR
INTRAVENOUS | Status: DC | PRN
  Administered 2015-11-10: 2 g via INTRAVENOUS

## 2015-11-10 MED ORDER — PHENYLEPHRINE 40 MCG/ML (10ML) SYRINGE FOR IV PUSH (FOR BLOOD PRESSURE SUPPORT)
PREFILLED_SYRINGE | INTRAVENOUS | Status: AC
  Filled 2015-11-10: qty 30

## 2015-11-10 MED ORDER — ACETAMINOPHEN 325 MG PO TABS: 650.0000 mg | ORAL_TABLET | Freq: Four times a day (QID) | ORAL | Status: DC | PRN

## 2015-11-10 MED ORDER — LACTATED RINGERS IV SOLN
INTRAVENOUS | Status: DC
  Administered 2015-11-10: 50 mL/h via INTRAVENOUS

## 2015-11-10 MED ORDER — PHENOL 1.4 % MT LIQD: 1.0000 | OROMUCOSAL | Status: DC | PRN

## 2015-11-10 MED ORDER — METOCLOPRAMIDE HCL 5 MG PO TABS: 5.0000 mg | ORAL_TABLET | Freq: Three times a day (TID) | ORAL | Status: DC | PRN

## 2015-11-10 MED ORDER — APIXABAN 5 MG PO TABS
5.0000 mg | ORAL_TABLET | Freq: Two times a day (BID) | ORAL | Status: DC
  Administered 2015-11-10 – 2015-11-12 (×4): 5 mg via ORAL
  Filled 2015-11-10 (×4): qty 1

## 2015-11-10 MED ORDER — PROPOFOL 10 MG/ML IV BOLUS
INTRAVENOUS | Status: DC | PRN
  Administered 2015-11-10: 70 mg via INTRAVENOUS

## 2015-11-10 MED ORDER — SODIUM CHLORIDE 0.9 % IV SOLN: INTRAVENOUS | Status: DC

## 2015-11-10 MED ORDER — CEFAZOLIN SODIUM-DEXTROSE 2-4 GM/100ML-% IV SOLN
2.0000 g | Freq: Four times a day (QID) | INTRAVENOUS | Status: AC
  Administered 2015-11-10 – 2015-11-11 (×2): 2 g via INTRAVENOUS
  Filled 2015-11-10 (×3): qty 100

## 2015-11-10 MED ORDER — PHENYLEPHRINE HCL 10 MG/ML IJ SOLN
10.0000 mg | INTRAVENOUS | Status: DC | PRN
  Administered 2015-11-10: 200 ug/min via INTRAVENOUS

## 2015-11-10 MED ORDER — ROCURONIUM BROMIDE 50 MG/5ML IV SOLN
INTRAVENOUS | Status: AC
  Filled 2015-11-10: qty 1

## 2015-11-10 MED ORDER — BISACODYL 10 MG RE SUPP: 10.0000 mg | Freq: Every day | RECTAL | Status: DC | PRN

## 2015-11-10 MED ORDER — ARTIFICIAL TEARS OP OINT
TOPICAL_OINTMENT | OPHTHALMIC | Status: AC
  Filled 2015-11-10: qty 3.5

## 2015-11-10 MED ORDER — LIDOCAINE 2% (20 MG/ML) 5 ML SYRINGE
INTRAMUSCULAR | Status: AC
  Filled 2015-11-10: qty 5

## 2015-11-10 MED ORDER — PHENYLEPHRINE HCL 10 MG/ML IJ SOLN
INTRAMUSCULAR | Status: DC | PRN
  Administered 2015-11-10 (×3): 200 ug via INTRAVENOUS

## 2015-11-10 MED ORDER — METOCLOPRAMIDE HCL 5 MG/ML IJ SOLN: 5.0000 mg | Freq: Three times a day (TID) | INTRAMUSCULAR | Status: DC | PRN

## 2015-11-10 MED ORDER — CETYLPYRIDINIUM CHLORIDE 0.05 % MT LIQD
7.0000 mL | Freq: Two times a day (BID) | OROMUCOSAL | Status: DC
  Administered 2015-11-10 – 2015-11-12 (×4): 7 mL via OROMUCOSAL

## 2015-11-10 MED ORDER — ARTIFICIAL TEARS OP OINT
TOPICAL_OINTMENT | OPHTHALMIC | Status: DC | PRN
  Administered 2015-11-10: 1 via OPHTHALMIC

## 2015-11-10 MED ORDER — MENTHOL 3 MG MT LOZG: 1.0000 | LOZENGE | OROMUCOSAL | Status: DC | PRN

## 2015-11-10 MED ORDER — ONDANSETRON HCL 4 MG/2ML IJ SOLN
INTRAMUSCULAR | Status: AC
  Filled 2015-11-10: qty 2

## 2015-11-10 MED ORDER — ONDANSETRON HCL 4 MG PO TABS
4.0000 mg | ORAL_TABLET | Freq: Four times a day (QID) | ORAL | Status: DC | PRN
  Filled 2015-11-10: qty 1

## 2015-11-10 MED ORDER — POVIDONE-IODINE 10 % EX SWAB
2.0000 "application " | Freq: Once | CUTANEOUS | Status: AC
  Administered 2015-11-10: 2 via TOPICAL

## 2015-11-10 MED ORDER — CEFAZOLIN SODIUM-DEXTROSE 2-4 GM/100ML-% IV SOLN
2.0000 g | INTRAVENOUS | Status: DC
  Filled 2015-11-10: qty 100

## 2015-11-10 MED ORDER — SUCCINYLCHOLINE CHLORIDE 20 MG/ML IJ SOLN
INTRAMUSCULAR | Status: DC | PRN
  Administered 2015-11-10: 100 mg via INTRAVENOUS

## 2015-11-10 MED ORDER — SUCCINYLCHOLINE CHLORIDE 200 MG/10ML IV SOSY
PREFILLED_SYRINGE | INTRAVENOUS | Status: AC
  Filled 2015-11-10: qty 10

## 2015-11-10 MED ORDER — DOCUSATE SODIUM 100 MG PO CAPS
100.0000 mg | ORAL_CAPSULE | Freq: Two times a day (BID) | ORAL | Status: DC
  Administered 2015-11-10 – 2015-11-12 (×4): 100 mg via ORAL
  Filled 2015-11-10 (×4): qty 1

## 2015-11-10 MED ORDER — ACETAMINOPHEN 650 MG RE SUPP: 650.0000 mg | Freq: Four times a day (QID) | RECTAL | Status: DC | PRN

## 2015-11-10 MED ORDER — FENTANYL CITRATE (PF) 250 MCG/5ML IJ SOLN
INTRAMUSCULAR | Status: DC | PRN
  Administered 2015-11-10 (×2): 25 ug via INTRAVENOUS

## 2015-11-10 MED ORDER — HYDROCODONE-ACETAMINOPHEN 5-325 MG PO TABS
1.0000 | ORAL_TABLET | Freq: Four times a day (QID) | ORAL | Status: DC | PRN
  Administered 2015-11-10 – 2015-11-12 (×2): 2 via ORAL
  Filled 2015-11-10 (×2): qty 2

## 2015-11-10 MED ORDER — CHLORHEXIDINE GLUCONATE 4 % EX LIQD
60.0000 mL | Freq: Once | CUTANEOUS | Status: AC
  Administered 2015-11-10: 4 via TOPICAL
  Filled 2015-11-10: qty 60

## 2015-11-10 MED ORDER — POLYETHYLENE GLYCOL 3350 17 G PO PACK: 17.0000 g | PACK | Freq: Every day | ORAL | Status: DC | PRN

## 2015-11-10 MED ORDER — MORPHINE SULFATE (PF) 2 MG/ML IV SOLN: 0.5000 mg | INTRAVENOUS | Status: DC | PRN

## 2015-11-10 MED ORDER — ONDANSETRON HCL 4 MG/2ML IJ SOLN
INTRAMUSCULAR | Status: DC | PRN
  Administered 2015-11-10: 4 mg via INTRAVENOUS

## 2015-11-10 MED ORDER — FENTANYL CITRATE (PF) 250 MCG/5ML IJ SOLN
INTRAMUSCULAR | Status: AC
  Filled 2015-11-10: qty 5

## 2015-11-10 MED ORDER — LIDOCAINE HCL (CARDIAC) 20 MG/ML IV SOLN
INTRAVENOUS | Status: DC | PRN
  Administered 2015-11-10: 60 mg via INTRATRACHEAL

## 2015-11-10 MED ORDER — SODIUM CHLORIDE 0.9 % IR SOLN
Status: DC | PRN
  Administered 2015-11-10: 1000 mL

## 2015-11-10 MED ORDER — ONDANSETRON HCL 4 MG/2ML IJ SOLN: 4.0000 mg | Freq: Four times a day (QID) | INTRAMUSCULAR | Status: DC | PRN

## 2015-11-10 SURGICAL SUPPLY — 41 items
BLADE SAW SAG 73X25 THK (BLADE) ×2
BLADE SAW SGTL 73X25 THK (BLADE) ×1 IMPLANT
BRUSH FEMORAL CANAL (MISCELLANEOUS) IMPLANT
CAPT HIP HEMI 1 ×3 IMPLANT
COVER SURGICAL LIGHT HANDLE (MISCELLANEOUS) ×3 IMPLANT
DRAPE IMP U-DRAPE 54X76 (DRAPES) ×3 IMPLANT
DRAPE INCISE IOBAN 85X60 (DRAPES) ×3 IMPLANT
DRAPE ORTHO SPLIT 77X108 STRL (DRAPES) ×4
DRAPE SURG ORHT 6 SPLT 77X108 (DRAPES) ×2 IMPLANT
DRAPE U-SHAPE 47X51 STRL (DRAPES) ×3 IMPLANT
DRSG MEPILEX BORDER 4X8 (GAUZE/BANDAGES/DRESSINGS) ×3 IMPLANT
DURAPREP 26ML APPLICATOR (WOUND CARE) ×3 IMPLANT
ELECT BLADE 6.5 EXT (BLADE) IMPLANT
ELECT CAUTERY BLADE 6.4 (BLADE) IMPLANT
ELECT REM PT RETURN 9FT ADLT (ELECTROSURGICAL) ×3
ELECTRODE REM PT RTRN 9FT ADLT (ELECTROSURGICAL) ×1 IMPLANT
GAUZE SPONGE 4X4 12PLY STRL (GAUZE/BANDAGES/DRESSINGS) ×3 IMPLANT
GLOVE BIOGEL PI IND STRL 9 (GLOVE) ×1 IMPLANT
GLOVE BIOGEL PI INDICATOR 9 (GLOVE) ×2
GLOVE SURG ORTHO 9.0 STRL STRW (GLOVE) ×3 IMPLANT
GOWN STRL REUS W/ TWL XL LVL3 (GOWN DISPOSABLE) ×1 IMPLANT
GOWN STRL REUS W/TWL XL LVL3 (GOWN DISPOSABLE) ×2
HANDPIECE INTERPULSE COAX TIP (DISPOSABLE)
KIT BASIN OR (CUSTOM PROCEDURE TRAY) ×3 IMPLANT
KIT ROOM TURNOVER OR (KITS) ×3 IMPLANT
MANIFOLD NEPTUNE II (INSTRUMENTS) ×3 IMPLANT
NS IRRIG 1000ML POUR BTL (IV SOLUTION) ×3 IMPLANT
PACK TOTAL JOINT (CUSTOM PROCEDURE TRAY) ×3 IMPLANT
PACK UNIVERSAL I (CUSTOM PROCEDURE TRAY) ×3 IMPLANT
PAD ARMBOARD 7.5X6 YLW CONV (MISCELLANEOUS) ×6 IMPLANT
SET HNDPC FAN SPRY TIP SCT (DISPOSABLE) IMPLANT
STAPLER VISISTAT 35W (STAPLE) ×3 IMPLANT
SUT ETHIBOND NAB CT1 #1 30IN (SUTURE) ×9 IMPLANT
SUT VIC AB 1 CT1 27 (SUTURE) ×2
SUT VIC AB 1 CT1 27XBRD ANBCTR (SUTURE) ×1 IMPLANT
SUT VIC AB 2-0 CTB1 (SUTURE) ×9 IMPLANT
TOWEL OR 17X24 6PK STRL BLUE (TOWEL DISPOSABLE) ×3 IMPLANT
TOWEL OR 17X26 10 PK STRL BLUE (TOWEL DISPOSABLE) ×3 IMPLANT
TOWER CARTRIDGE SMART MIX (DISPOSABLE) IMPLANT
TRAY FOLEY CATH 16FRSI W/METER (SET/KITS/TRAYS/PACK) IMPLANT
WATER STERILE IRR 1000ML POUR (IV SOLUTION) ×9 IMPLANT

## 2015-11-10 NOTE — Anesthesia Preprocedure Evaluation (Addendum)
Anesthesia Evaluation  Patient identified by MRN, date of birth, ID band Patient awake    Reviewed: Allergy & Precautions, NPO status , Patient's Chart, lab work & pertinent test results  Airway Mallampati: II  TM Distance: >3 FB Neck ROM: Limited    Dental no notable dental hx.    Pulmonary neg pulmonary ROS, former smoker,    Pulmonary exam normal breath sounds clear to auscultation       Cardiovascular hypertension, + Cardiac Stents, + Peripheral Vascular Disease and +CHF  + dysrhythmias Atrial Fibrillation  Rhythm:Regular Rate:Normal + Systolic murmurs Left ventricle: The cavity size was normal. Wall thickness was  normal. Systolic function was mildly to moderately reduced. The  estimated ejection fraction was 45%. Diffuse hypokinesis. There  is hypokinesis of the basalinferior myocardium. The study is not  technically sufficient to allow evaluation of LV diastolic  function. - Aortic valve: Trileaflet; moderately calcified leaflets. Left  coronary cusp mobility was restricted. There was mild stenosis.  There was mild regurgitation. Mean gradient (S): 6 mm Hg. Peak  gradient (S): 11 mm Hg. VTI ratio of LVOT to aortic valve: 0.52.  Valve area (VTI): 1.81 cm^2. Valve area (Vmax): 1.67 cm^2. Valve  area (Vmean): 1.64 cm^2. - Mitral valve: Calcified annulus. There was trivial regurgitation. - Left atrium: The atrium was mildly dilated. - Right atrium: Central venous pressure (est): 3 mm Hg. - Atrial septum: No defect or patent foramen ovale was identified. - Tricuspid valve: There was mild regurgitation. - Pulmonary arteries: PA peak pressure: 25 mm Hg (S). - Pericardium, extracardiac: There was no pericardial effusion.  Impressions:  - Normal LV wall thickness with LVEF approximately 45%. Diffuse  hypokinesis noted, most prominent in the basal inferior wall.  Indeterminate diastolic function. Mild left atrial  enlargement.  Moderate MAC with trivial mitral regurgitation. Mild calcific  aortic stenosis with significantly restricted left coronary cusp  and mild aortic regurgitation. Mild tricuspid regurgitation with  PASP 25 mmHg.   Neuro/Psych negative neurological ROS  negative psych ROS   GI/Hepatic negative GI ROS, Neg liver ROS,   Endo/Other  negative endocrine ROS  Renal/GU negative Renal ROS  negative genitourinary   Musculoskeletal negative musculoskeletal ROS (+)   Abdominal   Peds negative pediatric ROS (+)  Hematology  (+) anemia ,   Anesthesia Other Findings   Reproductive/Obstetrics negative OB ROS                            Anesthesia Physical Anesthesia Plan  ASA: III  Anesthesia Plan: General   Post-op Pain Management:    Induction: Intravenous  Airway Management Planned: Oral ETT  Additional Equipment:   Intra-op Plan:   Post-operative Plan: Extubation in OR  Informed Consent: I have reviewed the patients History and Physical, chart, labs and discussed the procedure including the risks, benefits and alternatives for the proposed anesthesia with the patient or authorized representative who has indicated his/her understanding and acceptance.   Dental advisory given  Plan Discussed with: CRNA and Surgeon  Anesthesia Plan Comments:         Anesthesia Quick Evaluation

## 2015-11-10 NOTE — Care Management Important Message (Signed)
Important Message  Patient Details  Name: Curtis Lang MRN: [id number] Date of Birth: 1930/12/07   Medicare Important Message Given:  Yes    [name] 11/10/2015, 9:24 AM

## 2015-11-10 NOTE — Interval H&P Note (Signed)
History and Physical Interval Note:  11/10/2015 6:28 AM  Curtis Lang  has presented today for surgery, with the diagnosis of Right Hip Fx  The various methods of treatment have been discussed with the patient and family. After consideration of risks, benefits and other options for treatment, the patient has consented to  Procedure(s): ARTHROPLASTY BIPOLAR HIP (HEMIARTHROPLASTY) (Right) as a surgical intervention .  The patient's history has been reviewed, patient examined, no change in status, stable for surgery.  I have reviewed the patient's chart and labs.  Questions were answered to the patient's satisfaction.     DUDA,MARCUS V

## 2015-11-10 NOTE — Progress Notes (Signed)
OT Cancellation Note  Patient Details Name: Curtis Lang MRN: [id number] DOB: [birth year]   Cancelled Treatment:    Reason Eval/Treat Not Completed: Other (comment) Pt is Medicare/Medicaid and current D/C plan is SNF. No apparent immediate acute care OT needs, therefore will defer OT to SNF. If OT eval is needed please call Acute Rehab Dept. at [phone] or text page OT at [phone].  [place], OTR/L  [id number] 11/10/2015 11/10/2015, 9:34 PM

## 2015-11-10 NOTE — Progress Notes (Signed)
PROGRESS NOTE    Curtis Lang  [id number] DOB: [birth year] DOA: 11/08/2015 PCP: Purvis Kilts, MD    Brief Narrative:  On 11/08/2015 by Dr. Orvan Falconer Curtis Lang is a 82 y.o. male with medical history significant of ASCVD, HTN, HLD, atrial fibrillation with chronic anticoagulation of Eliquis and mixed type dementia presented with complaints of right upper leg s/p fall that occurred this morning. Per daughter Butch Penny, this morning when ambulating to bathroom he fell and urinated on himself. She denies LOC. He has been on Eliquis for afib.   Assessment & Plan   Right femoral neck fracture -Status post mechanical fall -Orthopedic surgery consulted and appreciated, plan for surgery today -Cardiology consult appreciated for surgical clearance, Concerta be moderate risk given his age, cardiac history and dementia -Continue pain control  Atrial fibrillation -CHADSVASC 5 -Patient is on Eliquis at home -Cardiology recommended holding Eliquis post surgery for 24 hours patient to be high risk for bleeding -Continue metoprolol  Alzheimer's/vascular dementia -Stable, per daughter patient was hospitalized at [facility] recently -CT head showed no acute intracranial abnormality -Continue Aricept, Risperdal  Depression -continue Zoloft  Essential hypertension -Continue metoprolol, lisinopril  Ischemic cardiomyopathy -Last echocardiogram in 2014 showed an EF of 25-30%, improvement echocardiogram 2017-45% -Currently Euvolemic -Continue home medications, metoprolol and lisinopril -Monitor intake and output, daily weight  Leukocytosis -Improving, Possibly due to recent use of steroids for acute bronchitis -Currently afebrile -UA unremarkable -Chest x-ray shows no cardiopulmonary process  Acute kidney injury versus chronic kidney disease, stage III -Continue to monitor BMP  DVT Prophylaxis  SCDs  Code Status: DNR  Family Communication: Daughter at  bedside  Disposition Plan: Admitted. Pending surgery today  Consultants Orthopedics Cardiology  Procedures  None  Antibiotics   Anti-infectives    Start     Dose/Rate Route Frequency Ordered Stop   11/10/15 0800  ceFAZolin (ANCEF) IVPB 2g/100 mL premix     2 g 200 mL/hr over 30 Minutes Intravenous To Surgery 11/10/15 [id number] 11/11/15 0800      Subjective:   Curtis Lang seen and examined today. Patient does have history of dementia. Currently no complaints.  Objective:   Filed Vitals:   11/09/15 0451 11/09/15 1251 11/09/15 2109 11/10/15 0415  BP: 118/68 111/64 112/57 115/70  Pulse: 88 52 82 82  Temp: 98.3 F (36.8 C) 98.9 F (37.2 C) 99.6 F (37.6 C) 98.1 F (36.7 C)  TempSrc: Oral Oral Oral Oral  Resp: 20 20 18 18   Height:      Weight:      SpO2: 99% 98% 99% 99%    Intake/Output Summary (Last 24 hours) at 11/10/15 1058 Last data filed at 11/10/15 0900  Gross per 24 hour  Intake 863.67 ml  Output    350 ml  Net 513.67 ml   Filed Weights   11/08/15 2101  Weight: 77.111 kg (170 lb)    Exam  General: Well developed, well nourished, NAD, appears stated age  HEENT: NCAT, mucous membranes moist.   Cardiovascular: S1 S2 auscultated, irregularly irregular  Respiratory: Clear to auscultation bilaterally   Abdomen: Soft, nontender, nondistended, + bowel sounds  Extremities: warm dry without cyanosis clubbing or edema  Neuro: AAO x1 (self), nonfocal  Data Reviewed: I have personally reviewed following labs and imaging studies  CBC:  Recent Labs Lab 11/08/15 2234 11/09/15 0334  WBC 15.3* 12.9*  NEUTROABS 14.1*  --   HGB 12.2* 11.4*  HCT 36.6* 35.3*  MCV 96.8 97.8  PLT 199 [id number]   Basic Metabolic Panel:  Recent Labs Lab 11/08/15 2305 11/09/15 0334  NA 138 139  K 5.1 4.5  CL 101 107  CO2 25 23  GLUCOSE 185* 151*  BUN 22* 18  CREATININE 1.37* 1.36*  CALCIUM 9.0 8.6*   GFR: Estimated Creatinine Clearance: 44.9 mL/min (by C-G formula  based on Cr of 1.36). Liver Function Tests:  Recent Labs Lab 11/08/15 2305  AST 38  ALT 27  ALKPHOS 78  BILITOT 1.4*  PROT 7.7  ALBUMIN 3.9   No results for input(s): LIPASE, AMYLASE in the last 168 hours. No results for input(s): AMMONIA in the last 168 hours. Coagulation Profile:  Recent Labs Lab 11/08/15 2305  INR 1.19   Cardiac Enzymes: No results for input(s): CKTOTAL, CKMB, CKMBINDEX, TROPONINI in the last 168 hours. BNP (last 3 results) No results for input(s): PROBNP in the last 8760 hours. HbA1C: No results for input(s): HGBA1C in the last 72 hours. CBG: No results for input(s): GLUCAP in the last 168 hours. Lipid Profile: No results for input(s): CHOL, HDL, LDLCALC, TRIG, CHOLHDL, LDLDIRECT in the last 72 hours. Thyroid Function Tests: No results for input(s): TSH, T4TOTAL, FREET4, T3FREE, THYROIDAB in the last 72 hours. Anemia Panel: No results for input(s): VITAMINB12, FOLATE, FERRITIN, TIBC, IRON, RETICCTPCT in the last 72 hours. Urine analysis:    Component Value Date/Time   COLORURINE YELLOW 11/10/2015 0329   APPEARANCEUR CLEAR 11/10/2015 0329   LABSPEC 1.019 11/10/2015 0329   PHURINE 5.5 11/10/2015 0329   GLUCOSEU NEGATIVE 11/10/2015 0329   HGBUR NEGATIVE 11/10/2015 0329   BILIRUBINUR NEGATIVE 11/10/2015 0329   KETONESUR NEGATIVE 11/10/2015 0329   PROTEINUR NEGATIVE 11/10/2015 0329   UROBILINOGEN 0.2 04/02/2015 1510   NITRITE NEGATIVE 11/10/2015 0329   LEUKOCYTESUR NEGATIVE 11/10/2015 0329   Sepsis Labs: @LABRCNTIP (procalcitonin:4,lacticidven:4)  ) Recent Results (from the past 240 hour(s))  Surgical pcr screen     Status: None   Collection Time: 11/09/15  1:48 AM  Result Value Ref Range Status   MRSA, PCR NEGATIVE NEGATIVE Final   Staphylococcus aureus NEGATIVE NEGATIVE Final    Comment:        The Xpert SA Assay (FDA approved for NASAL specimens in patients over 64 years of age), is one component of a comprehensive  surveillance program.  Test performance has been validated by [facility] for patients greater than or equal to 65 year old. It is not intended to diagnose infection nor to guide or monitor treatment.       Radiology Studies: Dg Pelvis 1-2 Views  11/08/2015  CLINICAL DATA:  Right leg pain post unwitnessed fall this morning. EXAM: PELVIS - 1-2 VIEW COMPARISON:  None. FINDINGS: There is a displaced right femoral neck fracture. Mild proximal migration of the femoral shaft. No additional fracture of the bony pelvis. Left hip is seated in the acetabulum. Left inguinal calcification is unchanged from CT 03/13/2013. Vascular calcifications are seen. IMPRESSION: Displaced right femoral neck fracture. Electronically Signed   By: Jeb Levering M.D.   On: 11/08/2015 22:16   Ct Head Wo Contrast  11/08/2015  CLINICAL DATA:  Fall this morning. Dementia patient, does not remember the fall. EXAM: CT HEAD WITHOUT CONTRAST TECHNIQUE: Contiguous axial images were obtained from the base of the skull through the vertex without intravenous contrast. COMPARISON:  Head CT 10/15/2015 FINDINGS: No intracranial hemorrhage, mass effect, or midline shift. Moderate cerebral and cerebellar atrophy, unchanged. Mild chronic small vessel ischemia. Remote right frontal lobe  infarct with encephalomalacia. No hydrocephalus. The basilar cisterns are patent. No evidence of territorial infarct. No intracranial fluid collection. Atherosclerosis of skullbase vasculature. Calvarium is intact. Included paranasal sinuses and mastoid air cells are well aerated. IMPRESSION: 1.  No acute intracranial abnormality. 2. Stable atrophy and chronic ischemic change. Electronically Signed   By: Jeb Levering M.D.   On: 11/08/2015 22:00   Dg Chest Port 1 View  11/09/2015  CLINICAL DATA:  Acute onset of cough and shortness of breath. Initial encounter. EXAM: PORTABLE CHEST 1 VIEW COMPARISON:  Chest radiograph performed 10/15/2015 FINDINGS: The  lungs are well-aerated and clear. There is no evidence of focal opacification, pleural effusion or pneumothorax. The cardiomediastinal silhouette is borderline normal in size. No acute osseous abnormalities are seen. Cervical spinal fusion hardware is partially imaged. Clips are seen overlying the left axilla. IMPRESSION: No acute cardiopulmonary process seen. Electronically Signed   By: Garald Balding M.D.   On: 11/09/2015 21:35   Dg Femur, Min 2 Views Right  11/08/2015  CLINICAL DATA:  Right leg pain post unwitnessed fall this morning. EXAM: RIGHT FEMUR 2 VIEWS COMPARISON:  None. FINDINGS: There is a displaced right femoral neck fracture with mild proximal migration of the femoral shaft. The distal femur is intact. Femoral head remains seated in the acetabulum. Dense vascular calcifications are seen. IMPRESSION: Displaced right femoral neck fracture.  Distal femur is intact. Electronically Signed   By: Jeb Levering M.D.   On: 11/08/2015 22:17     Scheduled Meds: . atorvastatin  20 mg Oral QHS  .  ceFAZolin (ANCEF) IV  2 g Intravenous To OR  . chlorhexidine  60 mL Topical Once  . donepezil  10 mg Oral QODAY   And  . donepezil  23 mg Oral QODAY  . heparin  5,000 Units Subcutaneous Q8H  . lisinopril  40 mg Oral Daily  . metoprolol succinate  50 mg Oral Daily  . risperiDONE  0.5 mg Oral BID  . sertraline  25 mg Oral Daily   Continuous Infusions: . dextrose 5 % and 0.9% NaCl 10 mL/hr at 11/09/15 0208     LOS: 2 days   Time Spent in minutes   30 minutes  Lizmarie Witters D.O. on 11/10/2015 at 10:58 AM  Between 7am to 7pm - Pager - [phone]  After 7pm go to [email or web address] - password [email or web address]  And look for the night coverage person covering for me after hours  [facility] Office  [phone]

## 2015-11-10 NOTE — H&P (View-Only) (Signed)
ORTHOPAEDIC CONSULTATION  REQUESTING PHYSICIAN: Hosie Poisson, MD  Chief Complaint: Right femoral neck fracture  HPI: Curtis Lang is a 82 y.o. male who presents with a right femoral neck fracture.  By report patient fell yesterday morning sustaining the fracture and was walking during the day complaining of pain then went to [facility] emergency room radiographs show the fracture and patient was transferred to [facility] for definitive treatment.  Past Medical History  Diagnosis Date  . Arteriosclerotic cardiovascular disease (ASCVD)     BMS to RCA 1997; cutting ballon for RCA restenosis in 2001; cath 12/10 - 100% RCA L->R collaterals; nl EF; 40% LAD and OM1  . Hypertension   . Hyperlipidemia   . Cerebrovascular disease     bilateral bruits; duplex in 2009 - mild plaque w/o stenosis  . Peripheral vascular disease (HCC)     ABI on 0.87 on left  . Tobacco abuse, in remission     60 pack years; discontinued in 2003  . COPD (chronic obstructive pulmonary disease) ([place])   . Fasting hyperglycemia   . DDD (degenerative disc disease), cervical   . Muscle discomfort     No improvement after statins temporary discontinued  . Hypercholesteremia   . Dementia   . Atrial fibrillation ([place])   . Lymphoma ([place])     chemotherapy and radiation therapy in 2006; recurrence in 2007  . Squamous cell carcinoma [facility])     s/p Mohs surgery  . Cancer ([place])     b cell lymphoma  . Diffuse large B cell lymphoma ([place]) 03/18/2010  . CHF (congestive heart failure) [facility])    Past Surgical History  Procedure Laterality Date  . Anterior fusion cervical spine  2003    posterior?  . Inguinal hernia repair  1996    Right  . Orif femur fracture  1996  . Mohs surgery    . Colonoscopy  05/16/2011    Procedure: COLONOSCOPY;  Surgeon: Jamesetta So; diverticulosis, sigmoid polypectomy  . Port-a-cath removal Right 03/10/2013    Procedure: REMOVAL PORT-A-CATH;  Surgeon: Scherry Ran, MD;  Location: [facility];   Service: General;  Laterality: Right;  . Pyloric stenosis surgery      infant   Social History   Social History  . Marital Status: Divorced    Spouse Name: N/A  . Number of Children: N/A  . Years of Education: N/A   Social History Main Topics  . Smoking status: Former Smoker -- 1.00 packs/day for 50 years    Types: Cigarettes    Start date: 09/19/1945    Quit date: 06/12/1996  . Smokeless tobacco: Never Used  . Alcohol Use: No  . Drug Use: No  . Sexual Activity: No   Other Topics Concern  . None   Social History Narrative   Divorced; retired; does not get regular exercise.    Family History  Problem Relation Age of Onset  . Stroke Father     deceased - 62  . Stroke Mother     deceased - 58   . Heart attack Brother 37  . Heart attack Sister   . Colon cancer Neg Hx    - negative except otherwise stated in the family history section Allergies  Allergen Reactions  . Levaquin [Levofloxacin] Nausea And Vomiting   Prior to Admission medications   Medication Sig Start Date End Date Taking? Authorizing Provider  acetaminophen (TYLENOL) 325 MG tablet Take 650 mg by mouth daily as needed for  mild pain or moderate pain.   Yes Historical Provider, MD  apixaban (ELIQUIS) 5 MG TABS tablet Take 1 tablet (5 mg total) by mouth 2 (two) times daily. 01/14/15  Yes Arnoldo Lenis, MD  atorvastatin (LIPITOR) 40 MG tablet Take 0.5 tablets by mouth at bedtime.  05/21/15  Yes Historical Provider, MD  donepezil (ARICEPT) 10 MG tablet Take 10-23 mg by mouth at bedtime.    Yes Historical Provider, MD  furosemide (LASIX) 40 MG tablet TAKE 1 TABLET BY MOUTH 2 TIMES DAILY. 06/16/15  Yes Arnoldo Lenis, MD  lisinopril (PRINIVIL,ZESTRIL) 40 MG tablet Take 1 tablet (40 mg total) by mouth daily. 08/19/14  Yes Arnoldo Lenis, MD  metoprolol succinate (TOPROL-XL) 50 MG 24 hr tablet Take 50 mg by mouth daily. Take with or immediately following a meal.   Yes Historical Provider, MD  Nutritional  Supplements (ENSURE HIGH PROTEIN) LIQD Take 1 Can by mouth 2 (two) times daily between meals.   Yes Historical Provider, MD  risperiDONE (RISPERDAL M-TABS) 0.5 MG disintegrating tablet Take 0.5 mg by mouth 2 (two) times daily. Takes as needed either in the morning or at bedtime. Patient ,may take up to three tabs daily 09/17/15 11/08/15 Yes Historical Provider, MD  sertraline (ZOLOFT) 25 MG tablet Take 1 tablet (25 mg total) by mouth daily. 06/16/15  Yes Adam Telford Nab, DO  fluticasone (FLONASE) 50 MCG/ACT nasal spray Place 2 sprays into both nostrils as needed for allergies.     Historical Provider, MD  predniSONE (DELTASONE) 10 MG tablet Take 1 tablet (10 mg total) by mouth daily with breakfast. Take 6 tablets today and then decrease by 1 tablet daily until none are left. Patient not taking: Reported on 11/08/2015 10/16/15   Erline Hau, MD   Dg Pelvis 1-2 Views  11/08/2015  CLINICAL DATA:  Right leg pain post unwitnessed fall this morning. EXAM: PELVIS - 1-2 VIEW COMPARISON:  None. FINDINGS: There is a displaced right femoral neck fracture. Mild proximal migration of the femoral shaft. No additional fracture of the bony pelvis. Left hip is seated in the acetabulum. Left inguinal calcification is unchanged from CT 03/13/2013. Vascular calcifications are seen. IMPRESSION: Displaced right femoral neck fracture. Electronically Signed   By: Jeb Levering M.D.   On: 11/08/2015 22:16   Ct Head Wo Contrast  11/08/2015  CLINICAL DATA:  Fall this morning. Dementia patient, does not remember the fall. EXAM: CT HEAD WITHOUT CONTRAST TECHNIQUE: Contiguous axial images were obtained from the base of the skull through the vertex without intravenous contrast. COMPARISON:  Head CT 10/15/2015 FINDINGS: No intracranial hemorrhage, mass effect, or midline shift. Moderate cerebral and cerebellar atrophy, unchanged. Mild chronic small vessel ischemia. Remote right frontal lobe infarct with encephalomalacia. No  hydrocephalus. The basilar cisterns are patent. No evidence of territorial infarct. No intracranial fluid collection. Atherosclerosis of skullbase vasculature. Calvarium is intact. Included paranasal sinuses and mastoid air cells are well aerated. IMPRESSION: 1.  No acute intracranial abnormality. 2. Stable atrophy and chronic ischemic change. Electronically Signed   By: Jeb Levering M.D.   On: 11/08/2015 22:00   Dg Femur, Min 2 Views Right  11/08/2015  CLINICAL DATA:  Right leg pain post unwitnessed fall this morning. EXAM: RIGHT FEMUR 2 VIEWS COMPARISON:  None. FINDINGS: There is a displaced right femoral neck fracture with mild proximal migration of the femoral shaft. The distal femur is intact. Femoral head remains seated in the acetabulum. Dense vascular calcifications are seen. IMPRESSION:  Displaced right femoral neck fracture.  Distal femur is intact. Electronically Signed   By: Jeb Levering M.D.   On: 11/08/2015 22:17   - pertinent xrays, CT, MRI studies were reviewed and independently interpreted  Positive ROS: All other systems have been reviewed and were otherwise negative with the exception of those mentioned in the HPI and as above.  Physical Exam: General: Patient has Alzheimer's dementia he is not alert or oriented. Cardiovascular: No pedal edema Respiratory: No cyanosis, no use of accessory musculature GI: No organomegaly, abdomen is soft and non-tender Skin: No lesions in the area of chief complaint Neurologic: Sensation intact distally Psychiatric: Patient is not competent his daughter is with him in the room. Lymphatic: No axillary or cervical lymphadenopathy  MUSCULOSKELETAL:  On examination patient's right lower extremity is shortened and externally rotated. He has pain with attempted range of motion. Radiographs of the entire right femur and pelvis show a displaced right femoral neck fracture.  Assessment: Assessment: Right femoral neck fracture with multiple  medical problems including dementia heart failure cancer lymphoma peripheral vascular disease  Plan: Plan: I would recommend proceeding with hemiarthroplasty of the right hip. Discussed risks with the patient's daughter including increased risk of mortality with the patient's multiple medical problems she states she understands she would like a cardiology consult to evaluate the patient preoperatively. I will plan on surgery once patient is cleared medically.  Thank you for the consult and the opportunity to see Mr. [place], MD [place] [phone] 6:27 AM

## 2015-11-10 NOTE — Op Note (Signed)
11/08/2015 - 11/10/2015  1:10 PM  PATIENT:  Curtis Lang    PRE-OPERATIVE DIAGNOSIS:  Right Hip Fx,femoral neck  POST-OPERATIVE DIAGNOSIS:  Same  PROCEDURE:  ARTHROPLASTY BIPOLAR HIP (HEMIARTHROPLASTY)  SURGEON:  Newt Minion, MD  PHYSICIAN ASSISTANT:None ANESTHESIA:   General  PREOPERATIVE INDICATIONS:  Curtis Lang is a  82 y.o. male with a diagnosis of Right Hip Fx who failed conservative measures and elected for surgical management.    The risks benefits and alternatives were discussed with the patient preoperatively including but not limited to the risks of infection, bleeding, nerve injury, cardiopulmonary complications, the need for revision surgery, among others, and the patient was willing to proceed.  OPERATIVE IMPLANTS: [place] press-fit monopolar size 54 mm head, +0 neck, size 5 stem  OPERATIVE FINDINGS: stable hip postoperatively.  OPERATIVE PROCEDURE: patient was brought to operating room and underwent a general anesthetic. After adequate levels anesthesia obtained patient was placed the left lateral ddecubitus position with the right side up and the right lower extremity was prepped using DuraPrep draped into a sterile field Ioban was used to cover all exposed skin. A timeout was called. A posterior lateral incision was made this was carried down to the tensor fascia lata which was split and retractor was placed the piriformis and short external rotators were elevated off the femoral neck and retracted with Ethibond suture. The femoral neck cut was made 1 cm proximal to the calcar. The head was delivered and this measured 54 mm in diameter. The trial had a good suction fit the joint was irrigated to remove all the loose debris. The femoral canal was sequentially broached to a size 5. After further irrigation this 5 size stem plus the 54 mm head was inserted this had a good suction fit and this had good range of motion and was stable. Further irrigation hemostasis was  obtained the piriformis short external rotators and capsule was reapproximated with the Ethibond suture. The tensor fascia lata was closed using #1 Vicryl. Subcutaneous is closed using 0 Vicryl. Skin was closed using staples. A Mepilex dressatient was extubated taken to the PACU in stable condition.

## 2015-11-10 NOTE — Transfer of Care (Signed)
Immediate Anesthesia Transfer of Care Note  Patient: Curtis Lang  Procedure(s) Performed: Procedure(s): ARTHROPLASTY BIPOLAR HIP (HEMIARTHROPLASTY) (Right)  Patient Location: PACU  Anesthesia Type:General  Level of Consciousness: lethargic and responds to stimulation  Airway & Oxygen Therapy: Patient Spontanous Breathing and Patient connected to face mask oxygen  Post-op Assessment: Report given to RN and Post -op Vital signs reviewed and stable  Post vital signs: Reviewed and stable  Last Vitals:  Filed Vitals:   11/10/15 1117 11/10/15 1320  BP: 111/47 123/57  Pulse: 87   Temp: 37 C 36.6 C  Resp: 16 14    Last Pain:  Filed Vitals:   11/10/15 1320  PainSc: 0-No pain         Complications: No apparent anesthesia complications

## 2015-11-10 NOTE — Anesthesia Procedure Notes (Signed)
Date/Time: 11/10/2015 12:38 PM Performed by: Collier Bullock Pre-anesthesia Checklist: Patient identified, Emergency Drugs available, Suction available, Patient being monitored and Timeout performed Patient Re-evaluated:Patient Re-evaluated prior to inductionOxygen Delivery Method: Circle system utilized Preoxygenation: Pre-oxygenation with 100% oxygen Intubation Type: IV induction Ventilation: Two handed mask ventilation required Laryngoscope Size: Mac and 3 Grade View: Grade I Tube type: Oral Tube size: 7.5 mm Number of attempts: 1 Airway Equipment and Method: Stylet Placement Confirmation: ETT inserted through vocal cords under direct vision,  positive ETCO2 and breath sounds checked- equal and bilateral Secured at: 23 cm Tube secured with: Tape Dental Injury: Teeth and Oropharynx as per pre-operative assessment  Difficulty Due To: Difficulty was unanticipated Comments: Edentulous and required two hand mask ventilation to maintain seal.  DL with MAC 3, grade 1 view, 7.5 ETT passed atraumatically through cords.  +ETCO2 and BBS equal.

## 2015-11-10 NOTE — Anesthesia Postprocedure Evaluation (Signed)
Anesthesia Post Note  Patient: Curtis Lang  Procedure(s) Performed: Procedure(s) (LRB): ARTHROPLASTY BIPOLAR HIP (HEMIARTHROPLASTY) (Right)  Patient location during evaluation: PACU Anesthesia Type: General Level of consciousness: awake and alert Pain management: pain level controlled Vital Signs Assessment: post-procedure vital signs reviewed and stable Respiratory status: spontaneous breathing, nonlabored ventilation, respiratory function stable and patient connected to nasal cannula oxygen Cardiovascular status: blood pressure returned to baseline and stable Postop Assessment: no signs of nausea or vomiting Anesthetic complications: no    Last Vitals:  Filed Vitals:   11/10/15 1320 11/10/15 1330  BP: 123/57 105/77  Pulse:  81  Temp: 36.6 C   Resp: 14 19    Last Pain:  Filed Vitals:   11/10/15 1349  PainSc: 0-No pain                 Delmar Dondero S

## 2015-11-11 ENCOUNTER — Encounter (HOSPITAL_COMMUNITY): Payer: Self-pay | Admitting: Orthopedic Surgery

## 2015-11-11 DIAGNOSIS — E7800 Pure hypercholesterolemia, unspecified: Secondary | ICD-10-CM

## 2015-11-11 LAB — BASIC METABOLIC PANEL
ANION GAP: 7 (ref 5–15)
BUN: 16 mg/dL (ref 6–20)
CHLORIDE: 105 mmol/L (ref 101–111)
CO2: 26 mmol/L (ref 22–32)
Calcium: 8.1 mg/dL — ABNORMAL LOW (ref 8.9–10.3)
Creatinine, Ser: 1.09 mg/dL (ref 0.61–1.24)
GFR calc Af Amer: >60 mL/min (ref >60)
GLUCOSE: 158 mg/dL — AB (ref 65–99)
POTASSIUM: 4.1 mmol/L (ref 3.5–5.1)
Sodium: 138 mmol/L (ref 135–145)

## 2015-11-11 LAB — CBC
HEMATOCRIT: 33.1 % — AB (ref 39.0–52.0)
HEMOGLOBIN: 10.4 g/dL — AB (ref 13.0–17.0)
MCH: 30.9 pg (ref 26.0–34.0)
MCHC: 31.4 g/dL (ref 30.0–36.0)
MCV: 98.2 fL (ref 78.0–100.0)
PLATELETS: 157 10*3/uL (ref 150–400)
RBC: 3.37 MIL/uL — AB (ref 4.22–5.81)
RDW: 14.3 % (ref 11.5–15.5)
WBC: 10.6 10*3/uL — AB (ref 4.0–10.5)

## 2015-11-11 NOTE — Discharge Instructions (Signed)
INSTRUCTIONS AFTER JOINT REPLACEMENT  ° °o Remove items at home which could result in a fall. This includes throw rugs or furniture in walking pathways °o ICE to the affected joint every three hours while awake for 30 minutes at a time, for at least the first 3-5 days, and then as needed for pain and swelling.  Continue to use ice for pain and swelling. You may notice swelling that will progress down to the foot and ankle.  This is normal after surgery.  Elevate your leg when you are not up walking on it.   °o Continue to use the breathing machine you got in the hospital (incentive spirometer) which will help keep your temperature down.  It is common for your temperature to cycle up and down following surgery, especially at night when you are not up moving around and exerting yourself.  The breathing machine keeps your lungs expanded and your temperature down. ° ° °DIET:  As you were doing prior to hospitalization, we recommend a well-balanced diet. ° °DRESSING / WOUND CARE / SHOWERING ° °You may change your dressing 3-5 days after surgery.  Then change the dressing every day with sterile gauze.  Please use good hand washing techniques before changing the dressing.  Do not use any lotions or creams on the incision until instructed by your surgeon. ° °ACTIVITY ° °o Increase activity slowly as tolerated, but follow the weight bearing instructions below.   °o No driving for 6 weeks or until further direction given by your physician.  You cannot drive while taking narcotics.  °o No lifting or carrying greater than 10 lbs. until further directed by your surgeon. °o Avoid periods of inactivity such as sitting longer than an hour when not asleep. This helps prevent blood clots.  °o You may return to work once you are authorized by your doctor.  ° ° ° °WEIGHT BEARING  ° °Weight bearing as tolerated with assist device (walker, cane, etc) as directed, use it as long as suggested by your surgeon or therapist, typically at  least 4-6 weeks. ° ° °EXERCISES ° °Results after joint replacement surgery are often greatly improved when you follow the exercise, range of motion and muscle strengthening exercises prescribed by your doctor. Safety measures are also important to protect the joint from further injury. Any time any of these exercises cause you to have increased pain or swelling, decrease what you are doing until you are comfortable again and then slowly increase them. If you have problems or questions, call your caregiver or physical therapist for advice.  ° °Rehabilitation is important following a joint replacement. After just a few days of immobilization, the muscles of the leg can become weakened and shrink (atrophy).  These exercises are designed to build up the tone and strength of the thigh and leg muscles and to improve motion. Often times heat used for twenty to thirty minutes before working out will loosen up your tissues and help with improving the range of motion but do not use heat for the first two weeks following surgery (sometimes heat can increase post-operative swelling).  ° °These exercises can be done on a training (exercise) mat, on the floor, on a table or on a bed. Use whatever works the best and is most comfortable for you.    Use music or television while you are exercising so that the exercises are a pleasant break in your day. This will make your life better with the exercises acting as a break   in your routine that you can look forward to.   Perform all exercises about fifteen times, three times per day or as directed.  You should exercise both the operative leg and the other leg as well. ° °Exercises include: °  °• Quad Sets - Tighten up the muscle on the front of the thigh (Quad) and hold for 5-10 seconds.   °• Straight Leg Raises - With your knee straight (if you were given a brace, keep it on), lift the leg to 60 degrees, hold for 3 seconds, and slowly lower the leg.  Perform this exercise against  resistance later as your leg gets stronger.  °• Leg Slides: Lying on your back, slowly slide your foot toward your buttocks, bending your knee up off the floor (only go as far as is comfortable). Then slowly slide your foot back down until your leg is flat on the floor again.  °• Angel Wings: Lying on your back spread your legs to the side as far apart as you can without causing discomfort.  °• Hamstring Strength:  Lying on your back, push your heel against the floor with your leg straight by tightening up the muscles of your buttocks.  Repeat, but this time bend your knee to a comfortable angle, and push your heel against the floor.  You may put a pillow under the heel to make it more comfortable if necessary.  ° °A rehabilitation program following joint replacement surgery can speed recovery and prevent re-injury in the future due to weakened muscles. Contact your doctor or a physical therapist for more information on knee rehabilitation.  ° ° °CONSTIPATION ° °Constipation is defined medically as fewer than three stools per week and severe constipation as less than one stool per week.  Even if you have a regular bowel pattern at home, your normal regimen is likely to be disrupted due to multiple reasons following surgery.  Combination of anesthesia, postoperative narcotics, change in appetite and fluid intake all can affect your bowels.  ° °YOU MUST use at least one of the following options; they are listed in order of increasing strength to get the job done.  They are all available over the counter, and you may need to use some, POSSIBLY even all of these options:   ° °Drink plenty of fluids (prune juice may be helpful) and high fiber foods °Colace 100 mg by mouth twice a day  °Senokot for constipation as directed and as needed Dulcolax (bisacodyl), take with full glass of water  °Miralax (polyethylene glycol) once or twice a day as needed. ° °If you have tried all these things and are unable to have a bowel  movement in the first 3-4 days after surgery call either your surgeon or your primary doctor.   ° °If you experience loose stools or diarrhea, hold the medications until you stool forms back up.  If your symptoms do not get better within 1 week or if they get worse, check with your doctor.  If you experience "the worst abdominal pain ever" or develop nausea or vomiting, please contact the office immediately for further recommendations for treatment. ° ° °ITCHING:  If you experience itching with your medications, try taking only a single pain pill, or even half a pain pill at a time.  You can also use Benadryl over the counter for itching or also to help with sleep.  ° °TED HOSE STOCKINGS:  Use stockings on both legs until for at least 2 weeks or as   directed by physician office. They may be removed at night for sleeping.  MEDICATIONS:  See your medication summary on the After Visit Summary that nursing will review with you.  You may have some home medications which will be placed on hold until you complete the course of blood thinner medication.  It is important for you to complete the blood thinner medication as prescribed.  PRECAUTIONS:  If you experience chest pain or shortness of breath - call 911 immediately for transfer to the hospital emergency department.   If you develop a fever greater that 101 F, purulent drainage from wound, increased redness or drainage from wound, foul odor from the wound/dressing, or calf pain - CONTACT YOUR SURGEON.                                                   FOLLOW-UP APPOINTMENTS:  If you do not already have a post-op appointment, please call the office for an appointment to be seen by your surgeon.  Guidelines for how soon to be seen are listed in your After Visit Summary, but are typically between 1-4 weeks after surgery.  OTHER INSTRUCTIONS:   Knee Replacement:  Do not place pillow under knee, focus on keeping the knee straight while resting. CPM  instructions: 0-90 degrees, 2 hours in the morning, 2 hours in the afternoon, and 2 hours in the evening. Place foam block, curve side up under heel at all times except when in CPM or when walking.  DO NOT modify, tear, cut, or change the foam block in any way.  MAKE SURE YOU:   Understand these instructions.   Get help right away if you are not doing well or get worse.    Thank you for letting us be a part of your medical care team.  It is a privilege we respect greatly.  We hope these instructions will help you stay on track for a fast and full recovery!      Information on my medicine - ELIQUIS (apixaban)  This medication education was reviewed with me or my healthcare representative as part of my discharge preparation.  The pharmacist that spoke with me during my hospital stay was:  Saundra Shelling, [facility]  Why was Eliquis prescribed for you? Eliquis was prescribed for you to reduce the risk of a blood clot forming that can cause a stroke if you have a medical condition called atrial fibrillation (a type of irregular heartbeat).  Also prescribed for clot prevention after orthopedic surgery.  What do You need to know about Eliquis ? Take your Eliquis TWICE DAILY - one tablet in the morning and one tablet in the evening with or without food. If you have difficulty swallowing the tablet whole please discuss with your pharmacist how to take the medication safely.  Take Eliquis exactly as prescribed by your doctor and DO NOT stop taking Eliquis without talking to the doctor who prescribed the medication.  Stopping may increase your risk of developing a stroke.  Refill your prescription before you run out.  After discharge, you should have regular check-up appointments with your healthcare provider that is prescribing your Eliquis.  In the future your dose may need to be changed if your kidney function or weight changes by a significant amount or as you get older.  What do you do if  you miss a dose? If you miss a dose, take it as soon as you remember on the same day and resume taking twice daily.  Do not take more than one dose of ELIQUIS at the same time to make up a missed dose.  Important Safety Information A possible side effect of Eliquis is bleeding. You should call your healthcare provider right away if you experience any of the following: ? Bleeding from an injury or your nose that does not stop. ? Unusual colored urine (red or dark brown) or unusual colored stools (red or black). ? Unusual bruising for unknown reasons. ? A serious fall or if you hit your head (even if there is no bleeding).  Some medicines may interact with Eliquis and might increase your risk of bleeding or clotting while on Eliquis. To help avoid this, consult your healthcare provider or pharmacist prior to using any new prescription or non-prescription medications, including herbals, vitamins, non-steroidal anti-inflammatory drugs (NSAIDs) and supplements.  This website has more information on Eliquis (apixaban): http://www.eliquis.com/eliquis/home

## 2015-11-11 NOTE — Progress Notes (Signed)
Patient ID: Curtis Lang, male   DOB: 09/11/1930, 82 y.o.   MRN: [id number] Postoperative day 1 right hip hemiarthroplasty. Patient is awake this morning without complaints. Review the radiographs shows a stable hemiarthroplasty of the right hip. Patient is safe for discharge to skilled nursing at this time weightbearing as tolerated.

## 2015-11-11 NOTE — Clinical Social Work Note (Signed)
Clinical Social Work Assessment  Patient Details  Name: Curtis Lang MRN: [id number] Date of Birth: 08-14-1930  Date of referral:  11/11/15               Reason for consult:  Facility Placement, Discharge Planning                Permission sought to share information with:  Facility Sport and exercise psychologist, Family Supports Permission granted to share information::  Yes, Verbal Permission Granted  Name::     Butch Penny  Agency::  [facility]  Relationship::  Daughter  Contact Information:  [phone]  Housing/Transportation Living arrangements for the past 2 months:  [place] of Information:  Adult Children Patient Interpreter Needed:  None Criminal Activity/Legal Involvement Pertinent to Current Situation/Hospitalization:  No - Comment as needed Significant Relationships:  Adult Children Lives with:  Adult Children Do you feel safe going back to the place where you live?  Yes Need for family participation in patient care:  Yes (Comment) (Patient's daughter cares for patient.)  Care giving concerns:  Patient's daughter expressed no concerns at this time.   Social Worker assessment / plan:  LCSW received referral for possible SNF placement at time of discharge. LCSW spoke with patient's daughter regarding placement as patient is alert to self only. Patient's daughter expressed understanding of PT recommendation for SNF placement. Patient's daughter stated she currently does not have a preference for SNF placement at this time. LCSW to complete SNF referral and present patient's daughter with bed offers. LCSW to continue to follow and assist with discharge planning needs.  Employment status:  Retired Forensic scientist:  Medicare PT Recommendations:  [place] / Referral to community resources:  [place]  Patient/Family's Response to care:  Patient's daughter understanding and agreeable to [facility] plan  of care.   Patient/Family's Understanding of and Emotional Response to Diagnosis, Current Treatment, and Prognosis:  Patient's daughter understanding and agreeable to LCSW plan of care.  Emotional Assessment Appearance:  Other (Comment Required (LCSW spoke with patient's daughter.) Attitude/Demeanor/Rapport:  Other (LCSW spoke with patient's daughter.) Affect (typically observed):  Other (LCSW spoke with patient's daughter.) Orientation:  Oriented to Self Alcohol / Substance use:  Not Applicable Psych involvement (Current and /or in the community):  No (Comment) (Not appropriate on this admission.)  Discharge Needs  Concerns to be addressed:  No discharge needs identified Readmission within the last 30 days:  No Current discharge risk:  None Barriers to Discharge:  No Barriers Identified   Caroline Sauger, LCSW 11/11/2015, 3:32 PM [phone]

## 2015-11-11 NOTE — Progress Notes (Signed)
PROGRESS NOTE    Curtis Lang  [id number] DOB: April 29, 1931 DOA: 11/08/2015 PCP: Purvis Kilts, MD    Brief Narrative:  On 11/08/2015 by Dr. Orvan Falconer Curtis Lang is a 82 y.o. male with medical history significant of ASCVD, HTN, HLD, atrial fibrillation with chronic anticoagulation of Eliquis and mixed type dementia presented with complaints of right upper leg s/p fall that occurred this morning. Per daughter Butch Penny, this morning when ambulating to bathroom he fell and urinated on himself. She denies LOC. He has been on Eliquis for afib.   Assessment & Plan   Right femoral neck fracture -Status post mechanical fall -Orthopedic surgery consulted and appreciated, s/p arthroplasty bipolar hip -Cardiology consult appreciated for surgical clearance, considered to be moderate risk given his age, cardiac history and dementia -Continue pain control -Pending PT and OT evaluations -Will likely need SNF  Atrial fibrillation -CHADSVASC 5 -Patient is on Eliquis at home, restarted today by ortho -Cardiology recommended holding Eliquis post surgery for 24 hours patient to be high risk for bleeding -Continue metoprolol  Normocytic Anemia vs anemia secondary to blood loss -baseline hemoglobin between 11-12.   -hemoglobin drop to 10.4, possibly due to surgery -Ortho restarted Eliquis today, will continue to monitor CBC closely  Alzheimer's/vascular dementia -Stable, per daughter patient was hospitalized at [facility] recently -CT head showed no acute intracranial abnormality -Continue Aricept, Risperdal  Depression -continue Zoloft  Essential hypertension -Continue metoprolol, lisinopril  Ischemic cardiomyopathy -Last echocardiogram in 2014 showed an EF of 25-30%, improvement echocardiogram 2017-45% -Currently Euvolemic -Continue home medications, metoprolol and lisinopril -Monitor intake and output, daily weight  Leukocytosis -Improving, Possibly due to  recent use of steroids for acute bronchitis -Currently afebrile -UA and CXR unremarkable  Acute kidney injury versus chronic kidney disease, stage III -Continue to monitor BMP -Creatinine 1.09 today  DVT Prophylaxis  Eliquis  Code Status: DNR  Family Communication: Daughter at bedside  Disposition Plan: Admitted. Pending PT/OT.  Consultants Orthopedics Cardiology  Procedures  s/p arthroplasty bipolar hip  Antibiotics   Anti-infectives    Start     Dose/Rate Route Frequency Ordered Stop   11/10/15 1800  ceFAZolin (ANCEF) IVPB 2g/100 mL premix     2 g 200 mL/hr over 30 Minutes Intravenous Every 6 hours 11/10/15 1517 11/11/15 0202   11/10/15 0800  ceFAZolin (ANCEF) IVPB 2g/100 mL premix  Status:  Discontinued     2 g 200 mL/hr over 30 Minutes Intravenous To Surgery 11/10/15 [id number] 11/10/15 1411      Subjective:   Corinna Lines seen and examined today. Patient does have history of dementia. Currently no complaints. States it is too early for him to decide how he is feeling. Denies chest pain or shortness of breath.  Objective:   Filed Vitals:   11/10/15 1400 11/10/15 2100 11/11/15 0514 11/11/15 0950  BP: 118/62 103/64 111/62 111/68  Pulse: 80 61 92 94  Temp: 97.1 F (36.2 C) 97.6 F (36.4 C) 98 F (36.7 C) 98.6 F (37 C)  TempSrc: Oral  Axillary Oral  Resp: 18 18 17 18   Height:      Weight:      SpO2: 99% 99% 100% 100%    Intake/Output Summary (Last 24 hours) at 11/11/15 1114 Last data filed at 11/11/15 0600  Gross per 24 hour  Intake   1150 ml  Output    350 ml  Net    800 ml   Filed Weights   11/08/15 2101  Weight:  77.111 kg (170 lb)    Exam  General: Well developed, well nourished, NAD  HEENT: NCAT, mucous membranes moist.   Cardiovascular: S1 S2 auscultated, irregularly irregular  Respiratory: Clear to auscultation bilaterally   Abdomen: Soft, nontender, nondistended, + bowel sounds  Extremities: warm dry without cyanosis clubbing or  edema. Dressing clean and intact  Neuro: AAO x2 (self and place), nonfocal  Data Reviewed: I have personally reviewed following labs and imaging studies  CBC:  Recent Labs Lab 11/08/15 2234 11/09/15 0334 11/11/15 0400  WBC 15.3* 12.9* 10.6*  NEUTROABS 14.1*  --   --   HGB 12.2* 11.4* 10.4*  HCT 36.6* 35.3* 33.1*  MCV 96.8 97.8 98.2  PLT 199 186 [id number]   Basic Metabolic Panel:  Recent Labs Lab 11/08/15 2305 11/09/15 0334 11/11/15 0400  NA 138 139 138  K 5.1 4.5 4.1  CL 101 107 105  CO2 25 23 26   GLUCOSE 185* 151* 158*  BUN 22* 18 16  CREATININE 1.37* 1.36* 1.09  CALCIUM 9.0 8.6* 8.1*   GFR: Estimated Creatinine Clearance: 56 mL/min (by C-G formula based on Cr of 1.09). Liver Function Tests:  Recent Labs Lab 11/08/15 2305  AST 38  ALT 27  ALKPHOS 78  BILITOT 1.4*  PROT 7.7  ALBUMIN 3.9   No results for input(s): LIPASE, AMYLASE in the last 168 hours. No results for input(s): AMMONIA in the last 168 hours. Coagulation Profile:  Recent Labs Lab 11/08/15 2305  INR 1.19   Cardiac Enzymes: No results for input(s): CKTOTAL, CKMB, CKMBINDEX, TROPONINI in the last 168 hours. BNP (last 3 results) No results for input(s): PROBNP in the last 8760 hours. HbA1C: No results for input(s): HGBA1C in the last 72 hours. CBG:  Recent Labs Lab 11/10/15 1116  GLUCAP 116*   Lipid Profile: No results for input(s): CHOL, HDL, LDLCALC, TRIG, CHOLHDL, LDLDIRECT in the last 72 hours. Thyroid Function Tests: No results for input(s): TSH, T4TOTAL, FREET4, T3FREE, THYROIDAB in the last 72 hours. Anemia Panel: No results for input(s): VITAMINB12, FOLATE, FERRITIN, TIBC, IRON, RETICCTPCT in the last 72 hours. Urine analysis:    Component Value Date/Time   COLORURINE YELLOW 11/10/2015 0329   APPEARANCEUR CLEAR 11/10/2015 0329   LABSPEC 1.019 11/10/2015 0329   PHURINE 5.5 11/10/2015 0329   GLUCOSEU NEGATIVE 11/10/2015 0329   HGBUR NEGATIVE 11/10/2015 0329   BILIRUBINUR  NEGATIVE 11/10/2015 0329   KETONESUR NEGATIVE 11/10/2015 0329   PROTEINUR NEGATIVE 11/10/2015 0329   UROBILINOGEN 0.2 04/02/2015 1510   NITRITE NEGATIVE 11/10/2015 0329   LEUKOCYTESUR NEGATIVE 11/10/2015 0329   Sepsis Labs: @LABRCNTIP (procalcitonin:4,lacticidven:4)  ) Recent Results (from the past 240 hour(s))  Surgical pcr screen     Status: None   Collection Time: 11/09/15  1:48 AM  Result Value Ref Range Status   MRSA, PCR NEGATIVE NEGATIVE Final   Staphylococcus aureus NEGATIVE NEGATIVE Final    Comment:        The Xpert SA Assay (FDA approved for NASAL specimens in patients over 54 years of age), is one component of a comprehensive surveillance program.  Test performance has been validated by [facility] for patients greater than or equal to 85 year old. It is not intended to diagnose infection nor to guide or monitor treatment.       Radiology Studies: Pelvis Portable  11/10/2015  CLINICAL DATA:  Status post right hemiarthroplasty. EXAM: PORTABLE PELVIS 1-2 VIEWS COMPARISON:  Nov 08, 2015. FINDINGS: Right hip prosthesis appears to be well situated.  Expected postoperative changes are seen involving the surrounding soft tissues. No fracture or dislocation is noted. IMPRESSION: Status post right hemiarthroplasty. Electronically Signed   By: Marijo Conception, M.D.   On: 11/10/2015 16:24   Dg Chest Port 1 View  11/09/2015  CLINICAL DATA:  Acute onset of cough and shortness of breath. Initial encounter. EXAM: PORTABLE CHEST 1 VIEW COMPARISON:  Chest radiograph performed 10/15/2015 FINDINGS: The lungs are well-aerated and clear. There is no evidence of focal opacification, pleural effusion or pneumothorax. The cardiomediastinal silhouette is borderline normal in size. No acute osseous abnormalities are seen. Cervical spinal fusion hardware is partially imaged. Clips are seen overlying the left axilla. IMPRESSION: No acute cardiopulmonary process seen. Electronically Signed   By:  Garald Balding M.D.   On: 11/09/2015 21:35     Scheduled Meds: . antiseptic oral rinse  7 mL Mouth Rinse BID  . apixaban  5 mg Oral BID  . atorvastatin  20 mg Oral QHS  . docusate sodium  100 mg Oral BID  . donepezil  10 mg Oral QODAY   And  . donepezil  23 mg Oral QODAY  . lisinopril  40 mg Oral Daily  . metoprolol succinate  50 mg Oral Daily  . risperiDONE  0.5 mg Oral BID  . sertraline  25 mg Oral Daily   Continuous Infusions: . sodium chloride    . dextrose 5 % and 0.9% NaCl 10 mL/hr at 11/09/15 0208  . lactated ringers Stopped (11/10/15 1630)     LOS: 3 days   Time Spent in minutes   30 minutes  Kauan Kloosterman D.O. on 11/11/2015 at 11:14 AM  Between 7am to 7pm - Pager - [phone]  After 7pm go to [email or web address] - password [email or web address]  And look for the night coverage person covering for me after hours  [facility] Office  [phone]

## 2015-11-11 NOTE — Evaluation (Signed)
Physical Therapy Evaluation Patient Details Name: Curtis Lang MRN: [id number] DOB: 1931/03/27 Today's Date: 11/11/2015   History of Present Illness  Pt is an 82 y/o male who presents s/p fall at home sustaining a R femoral neck fracture. Pt was transferred from [facility] to [facility] for definitive treatment. Pt is now s/p R hip hemiarthroplasty on 11/10/15.  Clinical Impression  Pt admitted with above diagnosis. Pt currently with functional limitations due to the deficits listed below (see PT Problem List). At the time of PT eval pt was able to perform transfers to/from EOB with mod to max assist. Pt was unable to clear hips from bed to stand - attempted x3. Will need +2 for OOB at this time. Pt will benefit from skilled PT to increase their independence and safety with mobility to allow discharge to the venue listed below.       Follow Up Recommendations SNF;Supervision/Assistance - 24 hour    Equipment Recommendations  None recommended by PT (TBD by next venue of care)    Recommendations for Other Services       Precautions / Restrictions Precautions Precautions: Fall Restrictions Weight Bearing Restrictions: Yes RLE Weight Bearing: Weight bearing as tolerated      Mobility  Bed Mobility Overal bed mobility: Needs Assistance;+2 for physical assistance Bed Mobility: Supine to Sit;Sit to Supine     Supine to sit: Mod assist Sit to supine: Max assist;+2 for safety/equipment   General bed mobility comments: VC's for sequencing and technique. Pt required step-by-step instructions to complete transfer to/from EOB.   Transfers Overall transfer level: Needs assistance Equipment used: Rolling walker (2 wheeled) Transfers: Sit to/from Stand Sit to Stand: From elevated surface         General transfer comment: Attempted Sit<>Stand x3. Pt unable to clear hips from bed and was relying on therapist to lift him up.   Ambulation/Gait                Stairs             Wheelchair Mobility    Modified Rankin (Stroke Patients Only)       Balance Overall balance assessment: Needs assistance Sitting-balance support: Feet supported;No upper extremity supported Sitting balance-Leahy Scale: Poor Sitting balance - Comments: Assist required for pt to maintain upright posture in sitting at EOB.  Postural control: Posterior lean Standing balance support: Bilateral upper extremity supported Standing balance-Leahy Scale: Zero                               Pertinent Vitals/Pain Pain Assessment: Faces Faces Pain Scale: Hurts even more Pain Location: R hip Pain Descriptors / Indicators: Operative site guarding;Sore Pain Intervention(s): Limited activity within patient's tolerance;Monitored during session;Repositioned    Home Living Family/patient expects to be discharged to:: Skilled nursing facility                      Prior Function           Comments: Unsure as pt was not able to answer any history questions during session.      Hand Dominance        Extremity/Trunk Assessment   Upper Extremity Assessment: Defer to OT evaluation           Lower Extremity Assessment: RLE deficits/detail RLE Deficits / Details: Decreased strength and AROM consistent with above mentioned procedure    Cervical / Trunk Assessment:  Kyphotic  Communication   Communication: Other (comment) (Communication barrier vs. pt not wanting to answer questions)  Cognition Arousal/Alertness: Awake/alert Behavior During Therapy: Flat affect Overall Cognitive Status: History of cognitive impairments - at baseline       Memory: Decreased short-term memory              General Comments      Exercises        Assessment/Plan    PT Assessment Patient needs continued PT services  PT Diagnosis Difficulty walking;Acute pain   PT Problem List Decreased strength;Decreased activity tolerance;Decreased range of motion;Decreased  balance;Decreased mobility;Decreased knowledge of use of DME;Decreased knowledge of precautions;Decreased safety awareness;Pain  PT Treatment Interventions DME instruction;Gait training;Stair training;Functional mobility training;Therapeutic activities;Therapeutic exercise;Neuromuscular re-education;Patient/family education   PT Goals (Current goals can be found in the Care Plan section) Acute Rehab PT Goals PT Goal Formulation: Patient unable to participate in goal setting Time For Goal Achievement: 11/25/15 Potential to Achieve Goals: Good    Frequency Min 3X/week   Barriers to discharge        Co-evaluation               End of Session Equipment Utilized During Treatment: Gait belt Activity Tolerance: Patient limited by pain;Patient limited by fatigue Patient left: in bed;with call bell/phone within reach;with bed alarm set Nurse Communication: Mobility status         Time: [id number] PT Time Calculation (min) (ACUTE ONLY): 32 min   Charges:   PT Evaluation $PT Eval Moderate Complexity: 1 Procedure PT Treatments $Gait Training: 8-22 mins   PT G Codes:        Rolinda Roan 12-11-2015, 2:35 PM   Rolinda Roan, PT, DPT Acute Rehabilitation Services Pager: [phone]

## 2015-11-11 NOTE — NC FL2 (Signed)
[place] LEVEL OF CARE SCREENING TOOL     IDENTIFICATION  Patient Name: Curtis Lang Birthdate: [birth year] Sex: male Admission Date (Current Location): 11/08/2015  [facility] and [place] Number:  Herbalist and Address:  [facility] [place] [address] [place], [place] [address]      Provider Number: [id number]  Attending Physician Name and Address:  Cristal Ford, DO  Relative Name and Phone Number:       Current Level of Care: Hospital Recommended Level of Care: [place] Prior Approval Number:    Date Approved/Denied:   PASRR Number: [id number]  Discharge Plan: SNF    Current Diagnoses: Patient Active Problem List   Diagnosis Date Noted  . Preop cardiovascular exam 11/09/2015  . Hypertensive heart disease   . Hyperlipidemia   . Hypercholesteremia   . Ischemic cardiomyopathy   . Atrial fibrillation ([place])   . Hip fracture, right ([place]) 11/08/2015  . Chronic systolic CHF (congestive heart failure), NYHA class 3 ([place]) 11/08/2015  . Hip fracture ([place]) 11/08/2015  . Acute bronchitis 10/15/2015  . 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 Dementia in conditions classified elsewhere with aggressive behavior   . Fever 05/30/2014  . Sepsis ([place]) 05/30/2014  . Altered mental status   . Blood poisoning ([place])   . Esophageal dysphagia 11/12/2013  . Loss of weight 11/12/2013  . Abnormal weight loss 11/12/2013  . Early satiety 11/12/2013  . Malnutrition of moderate degree ([place]) 09/06/2013  . Atrial fibrillation with rapid ventricular response ([place]) 09/05/2013  . Acute on chronic systolic CHF (congestive heart failure), NYHA class 3 ([place]) 09/05/2013  . Dementia 09/05/2013  . CHF (congestive heart failure) ([place]) 09/05/2013  . Chronic systolic CHF (congestive heart failure) ([place]) 05/17/2013  . A-fib ([place]) 05/16/2013  . Chest tightness 05/15/2013  . New onset atrial  fibrillation ([place]) 05/15/2013  . Pulmonary edema 05/15/2013  . Chest pain 05/15/2013  . Prostatic hypertrophy 03/20/2013  . Anemia 01/11/2011  . Arteriosclerotic cardiovascular disease (ASCVD)   . Peripheral vascular disease ([place])   . Diffuse large B cell lymphoma ([place]) 03/18/2010  . Tobacco abuse, in remission 03/18/2010  . CEREBROVASCULAR DISEASE 03/18/2010  . [place] DISEASE, CERVICAL 03/18/2010  . Essential hypertension 11/03/2008  . COPD 11/03/2008    Orientation RESPIRATION BLADDER Height & Weight     Self  O2 Incontinent Weight: 170 lb (77.111 kg) Height:  6' (182.9 cm)  BEHAVIORAL SYMPTOMS/MOOD NEUROLOGICAL BOWEL NUTRITION STATUS      Continent Diet (Please see discharge summary.)  AMBULATORY STATUS COMMUNICATION OF NEEDS Skin   Extensive Assist Verbally Surgical wounds                       Personal Care Assistance Level of Assistance  Bathing, Feeding, Dressing Bathing Assistance: Maximum assistance Feeding assistance: Limited assistance Dressing Assistance: Maximum assistance     Functional Limitations Info             SPECIAL CARE FACTORS FREQUENCY  PT (By licensed PT), OT (By licensed OT)     PT Frequency: 5 OT Frequency: 5            Contractures      Additional Factors Info  Code Status, Allergies Code Status Info: DNR Allergies Info: Levaquin           Current Medications (11/11/2015):  This is the current hospital active medication list Current Facility-Administered Medications  Medication Dose Route Frequency Provider Last Rate Last Dose  . 0.9 %  sodium chloride infusion   Intravenous Continuous Meridee Score V, MD      . acetaminophen (TYLENOL) tablet 650 mg  650 mg Oral Q6H PRN Newt Minion, MD       Or  . acetaminophen (TYLENOL) suppository 650 mg  650 mg Rectal Q6H PRN Newt Minion, MD      . antiseptic oral rinse (CPC / CETYLPYRIDINIUM CHLORIDE 0.05%) solution 7 mL  7 mL Mouth Rinse BID Maryann Mikhail, DO   7 mL at 11/11/15  0953  . apixaban (ELIQUIS) tablet 5 mg  5 mg Oral BID Newt Minion, MD   5 mg at 11/11/15 0953  . atorvastatin (LIPITOR) tablet 20 mg  20 mg Oral QHS Orvan Falconer, MD   20 mg at 11/10/15 2126  . bisacodyl (DULCOLAX) suppository 10 mg  10 mg Rectal Daily PRN Meridee Score V, MD      . dextrose 5 %-0.9 % sodium chloride infusion   Intravenous Continuous Orvan Falconer, MD 10 mL/hr at 11/09/15 0208    . docusate sodium (COLACE) capsule 100 mg  100 mg Oral BID Newt Minion, MD   100 mg at 11/11/15 0953  . donepezil (ARICEPT) tablet 10 mg  10 mg Oral QODAY Rise Patience, MD   10 mg at 11/10/15 2126   And  . donepezil (ARICEPT) tablet 23 mg  23 mg Oral QODAY Rise Patience, MD   23 mg at 11/09/15 2302  . HYDROcodone-acetaminophen (NORCO/VICODIN) 5-325 MG per tablet 1-2 tablet  1-2 tablet Oral Q6H PRN Newt Minion, MD   2 tablet at 11/10/15 1707  . HYDROmorphone (DILAUDID) injection 0.5 mg  0.5 mg Intravenous Q4H PRN Orvan Falconer, MD      . lactated ringers infusion   Intravenous Continuous Annye Asa, MD   Stopped at 11/10/15 1630  . lisinopril (PRINIVIL,ZESTRIL) tablet 40 mg  40 mg Oral Daily Orvan Falconer, MD   40 mg at 11/11/15 0953  . menthol-cetylpyridinium (CEPACOL) lozenge 3 mg  1 lozenge Oral PRN Newt Minion, MD       Or  . phenol (CHLORASEPTIC) mouth spray 1 spray  1 spray Mouth/Throat PRN Newt Minion, MD      . metoCLOPramide (REGLAN) tablet 5-10 mg  5-10 mg Oral Q8H PRN Newt Minion, MD       Or  . metoCLOPramide (REGLAN) injection 5-10 mg  5-10 mg Intravenous Q8H PRN Newt Minion, MD      . metoprolol succinate (TOPROL-XL) 24 hr tablet 50 mg  50 mg Oral Daily Orvan Falconer, MD   50 mg at 11/11/15 0954  . morphine 2 MG/ML injection 0.5 mg  0.5 mg Intravenous Q2H PRN Newt Minion, MD      . ondansetron [facility]) tablet 4 mg  4 mg Oral Q6H PRN Newt Minion, MD       Or  . ondansetron [facility]) injection 4 mg  4 mg Intravenous Q6H PRN Newt Minion, MD      . polyethylene glycol (MIRALAX /  GLYCOLAX) packet 17 g  17 g Oral Daily PRN Meridee Score V, MD      . risperiDONE (RISPERDAL M-TABS) disintegrating tablet 0.5 mg  0.5 mg Oral BID Orvan Falconer, MD   0.5 mg at 11/11/15 0952  . sertraline (ZOLOFT) tablet 25 mg  25 mg Oral Daily Orvan Falconer, MD  25 mg at 11/11/15 [id number]     Discharge Medications: Please see discharge summary for a list of discharge medications.  Relevant Imaging Results:  Relevant Lab Results:   Additional Information SSN: [id number]  Caroline Sauger, LCSW

## 2015-11-11 NOTE — Clinical Social Work Placement (Signed)
   CLINICAL SOCIAL WORK PLACEMENT  NOTE  Date:  11/11/2015  Patient Details  Name: Curtis Lang MRN: [id number] Date of Birth: 12-Oct-1930  Clinical Social Work is seeking post-discharge placement for this patient at the [place] level of care (*CSW will initial, date and re-position this form in  chart as items are completed):  Yes   Patient/family provided with [place] Work Department's list of facilities offering this level of care within the geographic area requested by the patient (or if unable, by the patient's family).  Yes   Patient/family informed of their freedom to choose among providers that offer the needed level of care, that participate in Medicare, Medicaid or managed care program needed by the patient, have an available bed and are willing to accept the patient.  Yes   Patient/family informed of [place]'s ownership interest in [facility] and [facility], as well as of the fact that they are under no obligation to receive care at these facilities.  PASRR submitted to EDS on       PASRR number received on       Existing PASRR number confirmed on 11/11/15     FL2 transmitted to all facilities in geographic area requested by pt/family on 11/11/15     FL2 transmitted to all facilities within larger geographic area on 11/11/15     Patient informed that his/her managed care company has contracts with or will negotiate with certain facilities, including the following:            Patient/family informed of bed offers received.  Patient chooses bed at       Physician recommends and patient chooses bed at      Patient to be transferred to   on  .  Patient to be transferred to facility by       Patient family notified on   of transfer.  Name of family member notified:        PHYSICIAN Please sign FL2, Please sign DNR     Additional Comment:    _______________________________________________ Caroline Sauger,  LCSW 11/11/2015, 3:29 PM

## 2015-11-12 ENCOUNTER — Inpatient Hospital Stay (HOSPITAL_COMMUNITY): Payer: Medicare Other

## 2015-11-12 DIAGNOSIS — R059 Cough, unspecified: Secondary | ICD-10-CM | POA: Insufficient documentation

## 2015-11-12 DIAGNOSIS — Z9660 Presence of unspecified orthopedic joint implant: Secondary | ICD-10-CM

## 2015-11-12 DIAGNOSIS — R05 Cough: Secondary | ICD-10-CM | POA: Insufficient documentation

## 2015-11-12 DIAGNOSIS — I4891 Unspecified atrial fibrillation: Secondary | ICD-10-CM

## 2015-11-12 DIAGNOSIS — Z96649 Presence of unspecified artificial hip joint: Secondary | ICD-10-CM | POA: Insufficient documentation

## 2015-11-12 DIAGNOSIS — R0602 Shortness of breath: Secondary | ICD-10-CM | POA: Insufficient documentation

## 2015-11-12 LAB — BASIC METABOLIC PANEL
Anion gap: 7 (ref 5–15)
BUN: 16 mg/dL (ref 6–20)
CHLORIDE: 107 mmol/L (ref 101–111)
CO2: 27 mmol/L (ref 22–32)
Calcium: 8.5 mg/dL — ABNORMAL LOW (ref 8.9–10.3)
Creatinine, Ser: 0.9 mg/dL (ref 0.61–1.24)
Glucose, Bld: 133 mg/dL — ABNORMAL HIGH (ref 65–99)
POTASSIUM: 4 mmol/L (ref 3.5–5.1)
SODIUM: 141 mmol/L (ref 135–145)

## 2015-11-12 LAB — CBC
HCT: 33.2 % — ABNORMAL LOW (ref 39.0–52.0)
HEMOGLOBIN: 10.5 g/dL — AB (ref 13.0–17.0)
MCH: 31.5 pg (ref 26.0–34.0)
MCHC: 31.6 g/dL (ref 30.0–36.0)
MCV: 99.7 fL (ref 78.0–100.0)
PLATELETS: 171 10*3/uL (ref 150–400)
RBC: 3.33 MIL/uL — AB (ref 4.22–5.81)
RDW: 14 % (ref 11.5–15.5)
WBC: 10.5 10*3/uL (ref 4.0–10.5)

## 2015-11-12 MED ORDER — ASPIRIN EC 325 MG PO TBEC: 325.0000 mg | DELAYED_RELEASE_TABLET | Freq: Every day | ORAL | Status: DC

## 2015-11-12 MED ORDER — FLUTICASONE PROPIONATE 50 MCG/ACT NA SUSP
2.0000 | Freq: Every day | NASAL | Status: DC
  Administered 2015-11-12: 2 via NASAL
  Filled 2015-11-12: qty 16

## 2015-11-12 MED ORDER — GUAIFENESIN ER 600 MG PO TB12
1200.0000 mg | ORAL_TABLET | Freq: Two times a day (BID) | ORAL | Status: DC
  Administered 2015-11-12: 1200 mg via ORAL
  Filled 2015-11-12: qty 2

## 2015-11-12 MED ORDER — BISACODYL 10 MG RE SUPP: 10.0000 mg | Freq: Every day | RECTAL | Status: AC | PRN

## 2015-11-12 MED ORDER — HYDROCODONE-ACETAMINOPHEN 5-325 MG PO TABS: 1.0000 | ORAL_TABLET | Freq: Four times a day (QID) | ORAL | Status: AC | PRN

## 2015-11-12 MED ORDER — GUAIFENESIN ER 600 MG PO TB12: 1200.0000 mg | ORAL_TABLET | Freq: Two times a day (BID) | ORAL | Status: AC

## 2015-11-12 MED ORDER — POLYETHYLENE GLYCOL 3350 17 G PO PACK: 17.0000 g | PACK | Freq: Every day | ORAL | Status: AC | PRN

## 2015-11-12 MED ORDER — ACETAMINOPHEN 500 MG PO TABS: 500.0000 mg | ORAL_TABLET | Freq: Four times a day (QID) | ORAL | Status: AC | PRN

## 2015-11-12 NOTE — Clinical Social Work Note (Signed)
Patient to be discharged to [facility]. Patient's daughter, Butch Penny, updated regarding discharge. RN report number: [place], [place] [facility]: [phone] Surgical: [phone]

## 2015-11-12 NOTE — Progress Notes (Signed)
Report given to [place].  

## 2015-11-12 NOTE — Clinical Social Work Placement (Signed)
   CLINICAL SOCIAL WORK PLACEMENT  NOTE  Date:  11/12/2015  Patient Details  Name: Curtis Lang MRN: [id number] Date of Birth: 1930/10/01  Clinical Social Work is seeking post-discharge placement for this patient at the [place] level of care (*CSW will initial, date and re-position this form in  chart as items are completed):  Yes   Patient/family provided with [place] Work Department's list of facilities offering this level of care within the geographic area requested by the patient (or if unable, by the patient's family).  Yes   Patient/family informed of their freedom to choose among providers that offer the needed level of care, that participate in Medicare, Medicaid or managed care program needed by the patient, have an available bed and are willing to accept the patient.  Yes   Patient/family informed of [facility]'s ownership interest in [facility] and [facility], as well as of the fact that they are under no obligation to receive care at these facilities.  PASRR submitted to EDS on       PASRR number received on       Existing PASRR number confirmed on 11/11/15     FL2 transmitted to all facilities in geographic area requested by pt/family on 11/11/15     FL2 transmitted to all facilities within larger geographic area on 11/11/15     Patient informed that his/her managed care company has contracts with or will negotiate with certain facilities, including the following:        Yes   Patient/family informed of bed offers received.  Patient chooses bed at [facility]     Physician recommends and patient chooses bed at      Patient to be transferred to [facility] on 11/12/15.  Patient to be transferred to facility by PTAR     Patient family notified on 11/12/15 of transfer.  Name of family member notified:  Butch Penny     PHYSICIAN Please sign FL2, Please sign DNR     Additional Comment:     _______________________________________________ Caroline Sauger, LCSW 11/12/2015, 1:31 PM

## 2015-11-12 NOTE — Progress Notes (Signed)
Patient ID: Curtis Lang, male   DOB: 19-Feb-1931, 82 y.o.   MRN: [id number] Patient resting comfortably this morning. Bedside sitter encouraged regarding nutritional intake for the patient. Plan for discharge to skilled nursing. I will follow-up in 2 weeks.

## 2015-11-12 NOTE — Discharge Summary (Addendum)
Physician Discharge Summary  Curtis Lang [id number] DOB: [birth year] DOA: 11/08/2015  PCP: Purvis Kilts, MD  Admit date: 11/08/2015 Discharge date: 11/12/2015  Time spent: 45 minutes  Recommendations for Outpatient Follow-up:  Patient will be discharged to [facility] facility.  Continue physical and occupational therapy.  Patient will need to follow up with primary care provider within one week of discharge.  Follow up with Dr. Sharol Given, orthopedics in 2 weeks. Patient should continue medications as prescribed.  Patient should follow a heart healthy diet.   Discharge Diagnoses:  Right femoral neck fracture Atrial fibrillation Normocytic anemia vs anemia secondary to blood loss Alzheimer's/vascular dementia Depression Essential hypertension Ischemic cardiomyopathy Leukocytosis Acute kidney injury versus chronic kidney disease, stage III  Discharge Condition: Stable  Diet recommendation: heart healthy  Filed Weights   11/08/15 2101  Weight: 77.111 kg (170 lb)    History of present illness:  11/08/2015 by Dr. Orvan Falconer Curtis Lang is a 82 y.o. male with medical history significant of ASCVD, HTN, HLD, atrial fibrillation with chronic anticoagulation of Eliquis and mixed type dementia presented with complaints of right upper leg s/p fall that occurred this morning. Per daughter Butch Penny, this morning when ambulating to bathroom he fell and urinated on himself. She denies LOC. He has been on Eliquis for afib.   Hospital Course:  Right femoral neck fracture -Status post mechanical fall -Orthopedic surgery consulted and appreciated, s/p arthroplasty bipolar hip -Cardiology consult appreciated for surgical clearance, considered to be moderate risk given his age, cardiac history and dementia -Continue pain control -PT and OT rec SNF -Follow up with Dr. Sharol Given in 2 weeks -Continue incentive spirometry  Atrial fibrillation -CHADSVASC 5 -Patient is on Eliquis at  home, restarted today by ortho -Cardiology recommended holding Eliquis post surgery for 24 hours patient to be high risk for bleeding -Continue metoprolol  Normocytic Anemia vs anemia secondary to blood loss -baseline hemoglobin between 11-12.  -hemoglobin drop to 10.5, possibly due to surgery -Ortho restarted Eliquis, hemoglobin stable  Alzheimer's/vascular dementia -Stable, per daughter patient was hospitalized at [facility] recently -CT head showed no acute intracranial abnormality -Continue Aricept, Risperdal  Depression -continue Zoloft  Essential hypertension -Continue metoprolol, lisinopril  Ischemic cardiomyopathy -Last echocardiogram in 2014 showed an EF of 25-30%, improvement echocardiogram 2017-45% -Currently Euvolemic -Continue home medications, metoprolol and lisinopril -Monitor intake and output, daily weight -Resume lasix at discharge  Leukocytosis -Resolved, Possibly due to recent use of steroids for acute bronchitis -Currently afebrile -UA and CXR unremarkable  Acute kidney injury versus chronic kidney disease, stage III -Continue to monitor BMP -Creatinine 0.9 today  Question of dysphagia vs congestion -brought up by friend in the room.  She stated that patient could not eat, and felt congested.  However patient was able to eat breakfast -Continue incentive spirometry -CXR unremarkable -Continue flonase -Rn unable to deep suction as there was nothing there to suction -continue mucinex as needed for cough  Code Status: DNR  Consultants Orthopedics Cardiology  Procedures  s/p arthroplasty bipolar hip  Discharge Exam: Filed Vitals:   11/11/15 2108 11/12/15 0455  BP:  123/72  Pulse:  92  Temp: 98.2 F (36.8 C) 98.5 F (36.9 C)  Resp:  16    Exam  General: Well developed, well nourished, NAD  HEENT: NCAT, mucous membranes moist.   Cardiovascular: S1 S2 auscultated, irregularly irregular  Respiratory: Clear to  auscultation bilaterally  Abdomen: Soft, nontender, nondistended, + bowel sounds  Extremities: warm dry without  cyanosis clubbing or edema. Dressing clean and intact  Neuro: AAO x2 (self and place), nonfocal  Discharge Instructions      Discharge Instructions    Discharge instructions    Complete by:  As directed   Patient will be discharged to [facility] facility.  Continue physical and occupational therapy.  Patient will need to follow up with primary care provider within one week of discharge.  Follow up with Dr. Sharol Given, orthopedics in 2 weeks. Patient should continue medications as prescribed.  Patient should follow a heart healthy diet.     Weight bearing as tolerated    Complete by:  As directed             Medication List    STOP taking these medications        predniSONE 10 MG tablet  Commonly known as:  DELTASONE      TAKE these medications        acetaminophen 500 MG tablet  Commonly known as:  TYLENOL  Take 1 tablet (500 mg total) by mouth every 6 (six) hours as needed for mild pain.     apixaban 5 MG Tabs tablet  Commonly known as:  ELIQUIS  Take 1 tablet (5 mg total) by mouth 2 (two) times daily.     atorvastatin 40 MG tablet  Commonly known as:  LIPITOR  Take 0.5 tablets by mouth at bedtime.     bisacodyl 10 MG suppository  Commonly known as:  DULCOLAX  Place 1 suppository (10 mg total) rectally daily as needed for moderate constipation.     donepezil 10 MG tablet  Commonly known as:  ARICEPT  Take 10-23 mg by mouth at bedtime.     ENSURE HIGH PROTEIN Liqd  Take 1 Can by mouth 2 (two) times daily between meals.     fluticasone 50 MCG/ACT nasal spray  Commonly known as:  FLONASE  Place 2 sprays into both nostrils as needed for allergies.     furosemide 40 MG tablet  Commonly known as:  LASIX  TAKE 1 TABLET BY MOUTH 2 TIMES DAILY.     guaiFENesin 600 MG 12 hr tablet  Commonly known as:  MUCINEX  Take 2 tablets (1,200 mg total) by  mouth 2 (two) times daily.     HYDROcodone-acetaminophen 5-325 MG tablet  Commonly known as:  NORCO/VICODIN  Take 1-2 tablets by mouth every 6 (six) hours as needed for moderate pain.     lisinopril 40 MG tablet  Commonly known as:  PRINIVIL,ZESTRIL  Take 1 tablet (40 mg total) by mouth daily.     metoprolol succinate 50 MG 24 hr tablet  Commonly known as:  TOPROL-XL  Take 50 mg by mouth daily. Take with or immediately following a meal.     polyethylene glycol packet  Commonly known as:  MIRALAX / GLYCOLAX  Take 17 g by mouth daily as needed for mild constipation.     risperiDONE 0.5 MG disintegrating tablet  Commonly known as:  RISPERDAL M-TABS  Take 0.5 mg by mouth 2 (two) times daily. Takes as needed either in the morning or at bedtime. Patient ,may take up to three tabs daily     sertraline 25 MG tablet  Commonly known as:  ZOLOFT  Take 1 tablet (25 mg total) by mouth daily.       Allergies  Allergen Reactions  . Levaquin [Levofloxacin] Nausea And Vomiting   Follow-up Information    Follow up with DUDA,MARCUS V,  MD In 2 weeks.   Specialty:  Orthopedic Surgery   Contact information:   [place] [address] [phone]       Follow up with Purvis Kilts, MD. Schedule an appointment as soon as possible for a visit in 1 week.   Specialty:  Family Medicine   Why:  Hospital follow up   Contact information:   [address] [place] [id number] [phone]        The results of significant diagnostics from this hospitalization (including imaging, microbiology, ancillary and laboratory) are listed below for reference.    Significant Diagnostic Studies: Dg Chest 2 View  11/12/2015  CLINICAL DATA:  Shortness of breath.  Pain all over. EXAM: CHEST  2 VIEW COMPARISON:  11/09/2015 FINDINGS: Surgical clips in the axilla bilaterally. Both lungs are clear. Heart size is within normal limits. Atherosclerotic calcifications at the aortic  arch are unchanged. Surgical plate in the lower cervical spine. No large pleural effusions. No acute bone abnormality. Degenerative changes at the left glenohumeral joint. IMPRESSION: No acute cardiopulmonary disease. Electronically Signed   By: Markus Daft M.D.   On: 11/12/2015 13:13   Dg Chest 2 View  10/15/2015  CLINICAL DATA:  Cough with gait disturbance.  Atrial fibrillation. EXAM: CHEST  2 VIEW COMPARISON:  April 02, 2015 FINDINGS: There is no edema or consolidation. Heart is upper normal in size with pulmonary vascularity within normal limits. There is atherosclerotic calcification in the ascending aorta and aortic arch regions. There are foci of calcification in the anterior descending coronary artery. There are surgical clips in each axilla as well as postoperative change in the lower cervical spine. No adenopathy evident. IMPRESSION: No edema or consolidation. Coronary artery calcification is evident radiographically. Electronically Signed   By: Lowella Grip III M.D.   On: 10/15/2015 13:47   Dg Pelvis 1-2 Views  11/08/2015  CLINICAL DATA:  Right leg pain post unwitnessed fall this morning. EXAM: PELVIS - 1-2 VIEW COMPARISON:  None. FINDINGS: There is a displaced right femoral neck fracture. Mild proximal migration of the femoral shaft. No additional fracture of the bony pelvis. Left hip is seated in the acetabulum. Left inguinal calcification is unchanged from CT 03/13/2013. Vascular calcifications are seen. IMPRESSION: Displaced right femoral neck fracture. Electronically Signed   By: Jeb Levering M.D.   On: 11/08/2015 22:16   Ct Head Wo Contrast  11/08/2015  CLINICAL DATA:  Fall this morning. Dementia patient, does not remember the fall. EXAM: CT HEAD WITHOUT CONTRAST TECHNIQUE: Contiguous axial images were obtained from the base of the skull through the vertex without intravenous contrast. COMPARISON:  Head CT 10/15/2015 FINDINGS: No intracranial hemorrhage, mass effect, or midline  shift. Moderate cerebral and cerebellar atrophy, unchanged. Mild chronic small vessel ischemia. Remote right frontal lobe infarct with encephalomalacia. No hydrocephalus. The basilar cisterns are patent. No evidence of territorial infarct. No intracranial fluid collection. Atherosclerosis of skullbase vasculature. Calvarium is intact. Included paranasal sinuses and mastoid air cells are well aerated. IMPRESSION: 1.  No acute intracranial abnormality. 2. Stable atrophy and chronic ischemic change. Electronically Signed   By: Jeb Levering M.D.   On: 11/08/2015 22:00   Ct Head Wo Contrast  10/15/2015  CLINICAL DATA:  82 year old male with altered mental status and confusion. EXAM: CT HEAD WITHOUT CONTRAST TECHNIQUE: Contiguous axial images were obtained from the base of the skull through the vertex without intravenous contrast. COMPARISON:  04/02/2015 MR and prior studies. FINDINGS: Atrophy, chronic small-vessel  white matter ischemic changes and inferior right frontal encephalomalacia again identified. No acute intracranial abnormalities are identified, including mass lesion or mass effect, hydrocephalus, extra-axial fluid collection, midline shift, hemorrhage, or acute infarction. The visualized bony calvarium is unremarkable. IMPRESSION: No evidence of acute intracranial abnormality. Atrophy, chronic small-vessel white matter ischemic changes and inferior right frontal encephalomalacia. Electronically Signed   By: Margarette Canada M.D.   On: 10/15/2015 17:12   Pelvis Portable  11/10/2015  CLINICAL DATA:  Status post right hemiarthroplasty. EXAM: PORTABLE PELVIS 1-2 VIEWS COMPARISON:  Nov 08, 2015. FINDINGS: Right hip prosthesis appears to be well situated. Expected postoperative changes are seen involving the surrounding soft tissues. No fracture or dislocation is noted. IMPRESSION: Status post right hemiarthroplasty. Electronically Signed   By: Marijo Conception, M.D.   On: 11/10/2015 16:24   Dg Chest Port 1  View  11/09/2015  CLINICAL DATA:  Acute onset of cough and shortness of breath. Initial encounter. EXAM: PORTABLE CHEST 1 VIEW COMPARISON:  Chest radiograph performed 10/15/2015 FINDINGS: The lungs are well-aerated and clear. There is no evidence of focal opacification, pleural effusion or pneumothorax. The cardiomediastinal silhouette is borderline normal in size. No acute osseous abnormalities are seen. Cervical spinal fusion hardware is partially imaged. Clips are seen overlying the left axilla. IMPRESSION: No acute cardiopulmonary process seen. Electronically Signed   By: Garald Balding M.D.   On: 11/09/2015 21:35   Dg Femur, Min 2 Views Right  11/08/2015  CLINICAL DATA:  Right leg pain post unwitnessed fall this morning. EXAM: RIGHT FEMUR 2 VIEWS COMPARISON:  None. FINDINGS: There is a displaced right femoral neck fracture with mild proximal migration of the femoral shaft. The distal femur is intact. Femoral head remains seated in the acetabulum. Dense vascular calcifications are seen. IMPRESSION: Displaced right femoral neck fracture.  Distal femur is intact. Electronically Signed   By: Jeb Levering M.D.   On: 11/08/2015 22:17    Microbiology: Recent Results (from the past 240 hour(s))  Surgical pcr screen     Status: None   Collection Time: 11/09/15  1:48 AM  Result Value Ref Range Status   MRSA, PCR NEGATIVE NEGATIVE Final   Staphylococcus aureus NEGATIVE NEGATIVE Final    Comment:        The Xpert SA Assay (FDA approved for NASAL specimens in patients over 65 years of age), is one component of a comprehensive surveillance program.  Test performance has been validated by [facility] for patients greater than or equal to 43 year old. It is not intended to diagnose infection nor to guide or monitor treatment.      Labs: Basic Metabolic Panel:  Recent Labs Lab 11/08/15 2305 11/09/15 0334 11/11/15 0400 11/12/15 0737  NA 138 139 138 141  K 5.1 4.5 4.1 4.0  CL 101 107  105 107  CO2 25 23 26 27   GLUCOSE 185* 151* 158* 133*  BUN 22* 18 16 16   CREATININE 1.37* 1.36* 1.09 0.90  CALCIUM 9.0 8.6* 8.1* 8.5*   Liver Function Tests:  Recent Labs Lab 11/08/15 2305  AST 38  ALT 27  ALKPHOS 78  BILITOT 1.4*  PROT 7.7  ALBUMIN 3.9   No results for input(s): LIPASE, AMYLASE in the last 168 hours. No results for input(s): AMMONIA in the last 168 hours. CBC:  Recent Labs Lab 11/08/15 2234 11/09/15 0334 11/11/15 0400 11/12/15 0737  WBC 15.3* 12.9* 10.6* 10.5  NEUTROABS 14.1*  --   --   --  HGB 12.2* 11.4* 10.4* 10.5*  HCT 36.6* 35.3* 33.1* 33.2*  MCV 96.8 97.8 98.2 99.7  PLT 199 186 157 171   Cardiac Enzymes: No results for input(s): CKTOTAL, CKMB, CKMBINDEX, TROPONINI in the last 168 hours. BNP: BNP (last 3 results) No results for input(s): BNP in the last 8760 hours.  ProBNP (last 3 results) No results for input(s): PROBNP in the last 8760 hours.  CBG:  Recent Labs Lab 11/10/15 1116  GLUCAP 116*       Signed:  Cristal Ford  [facility] 11/12/2015, 1:19 PM

## 2015-11-12 NOTE — Care Management Important Message (Signed)
Important Message  Patient Details  Name: Curtis Lang MRN: [id number] Date of Birth: Jun 07, 1931   Medicare Important Message Given:  Yes    [name] 11/12/2015, 10:03 AM

## 2015-11-15 ENCOUNTER — Other Ambulatory Visit: Payer: Self-pay | Admitting: Cardiology

## 2015-11-29 ENCOUNTER — Telehealth: Payer: Self-pay | Admitting: Neurology

## 2015-11-29 ENCOUNTER — Telehealth: Payer: Self-pay | Admitting: *Deleted

## 2015-11-29 NOTE — Telephone Encounter (Signed)
Per Curtis Lang daughter-patient passes away on 12/08/2015 @ [place]

## 2015-11-29 NOTE — Telephone Encounter (Signed)
Patient's son called to let Dr. Tomi Likens know that his father Curtis Lang 19-Oct-2030 passed away 2022-10-15 12/26/2015.

## 2015-11-29 NOTE — Telephone Encounter (Signed)
Sympathy card placed in inbox.

## 2015-12-11 DEATH — deceased

## 2015-12-17 ENCOUNTER — Ambulatory Visit: Payer: Medicare Other | Admitting: Neurology

## 2016-01-31 ENCOUNTER — Ambulatory Visit: Payer: Medicare Other | Admitting: Cardiology

## 2016-03-17 ENCOUNTER — Other Ambulatory Visit (HOSPITAL_COMMUNITY): Payer: 59

## 2016-03-21 ENCOUNTER — Ambulatory Visit (HOSPITAL_COMMUNITY): Payer: 59 | Admitting: Oncology

## 2016-10-16 IMAGING — CT CT HEAD W/O CM
4 series · 17 of 47 positions shown, 19 images · non-contrast
Comparison: Head CT 10/15/2015

CLINICAL DATA: Fall this morning. Dementia patient, does not
remember the fall.

EXAM:
CT HEAD WITHOUT CONTRAST
TECHNIQUE: Contiguous axial images were obtained from the base of the skull
through the vertex without intravenous contrast.

[Series 2: head w/o · axial · non-contrast · 0.45mm/px · z∈[+34,+150]mm · 8 of 39 slices shown, 10 images]
[im 5/39  brain]
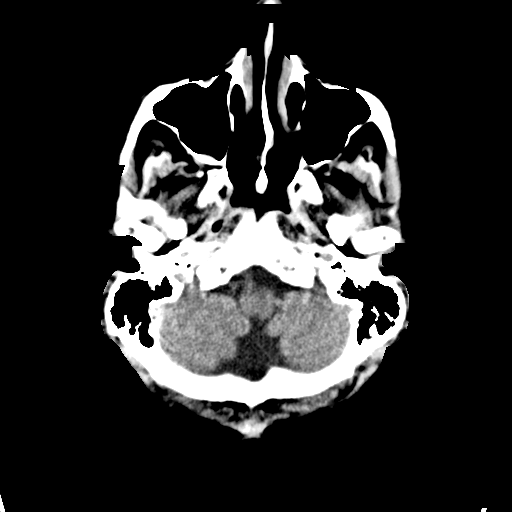
[im 5/39  bone]
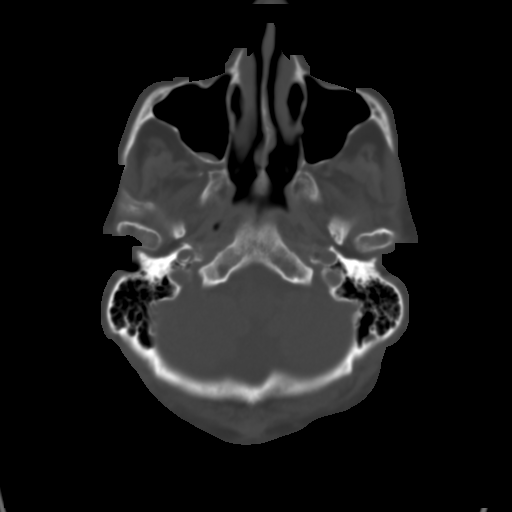
[im 9/39  brain]
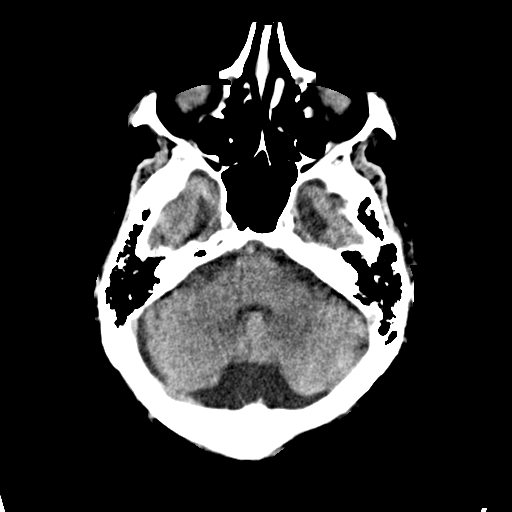
[im 13/39  brain]
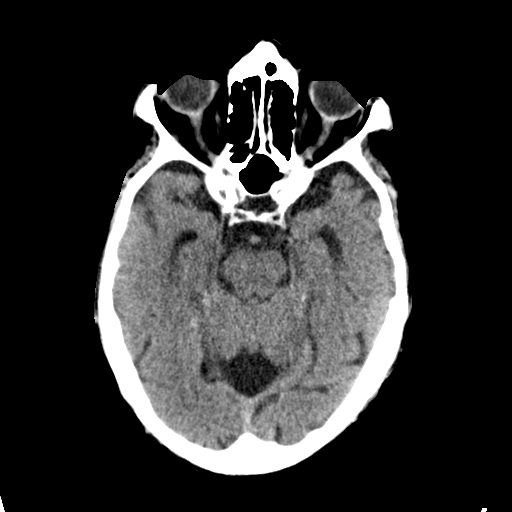
[im 17/39  brain]
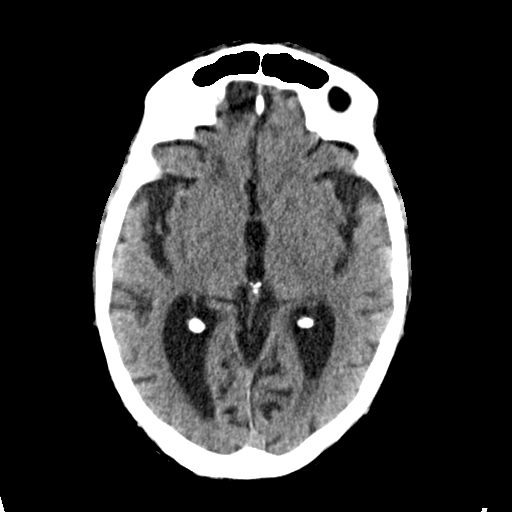
[im 22/39  brain]
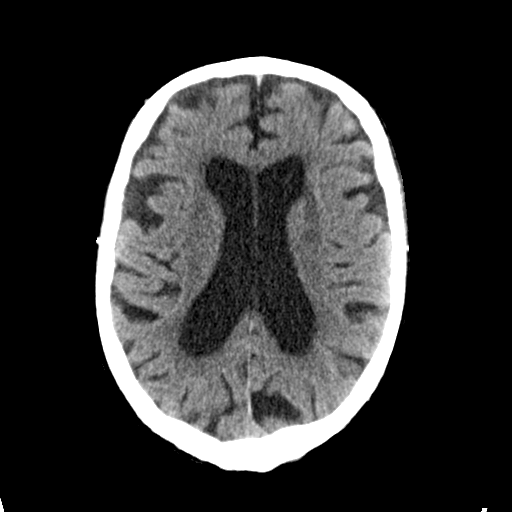
[im 22/39  bone]
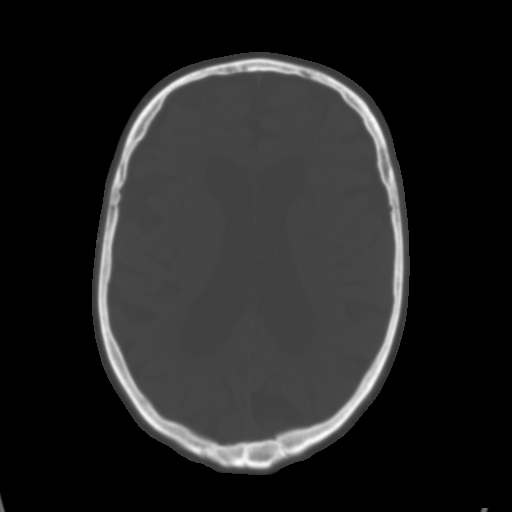
[im 26/39  brain]
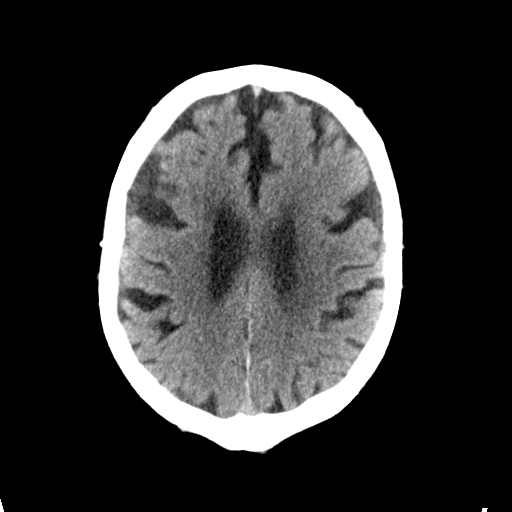
[im 30/39  brain]
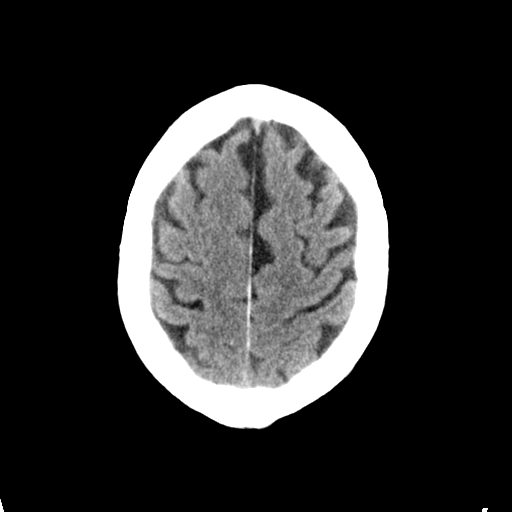
[im 34/39  brain]
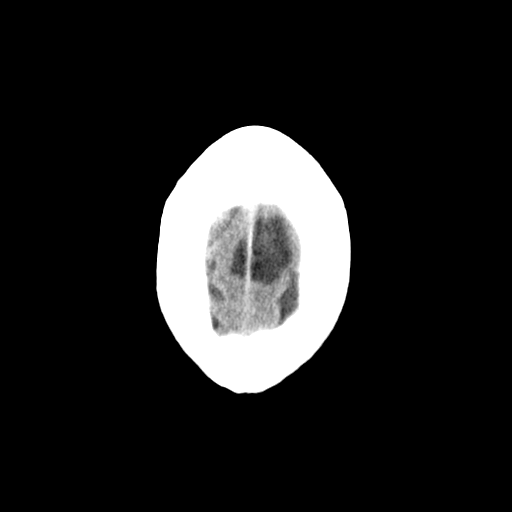

[Series 3: head bone · axial · 0.45mm/px · z∈[+34,+66]mm · 3 of 78 slices shown]
[im 9/78  bone]
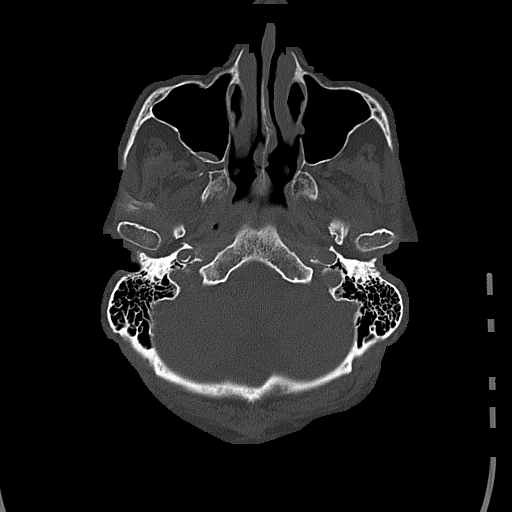
[im 17/78  bone]
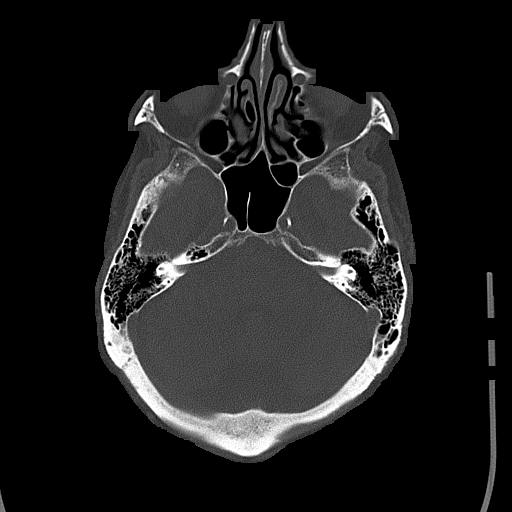
[im 25/78  bone]
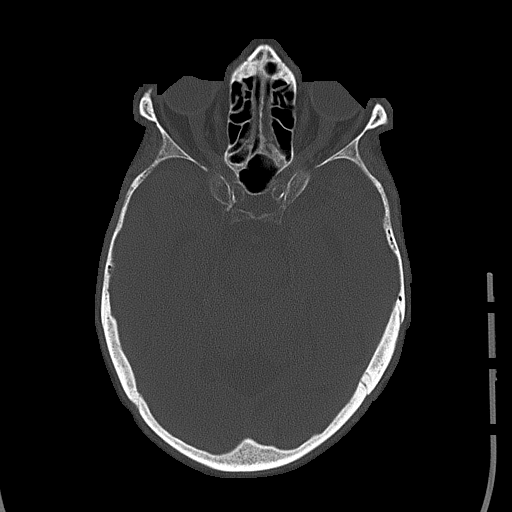

[Series 4: coronal · coronal · 0.31mm/px · 3 of 73 slices shown]
[im 25/73  brain]
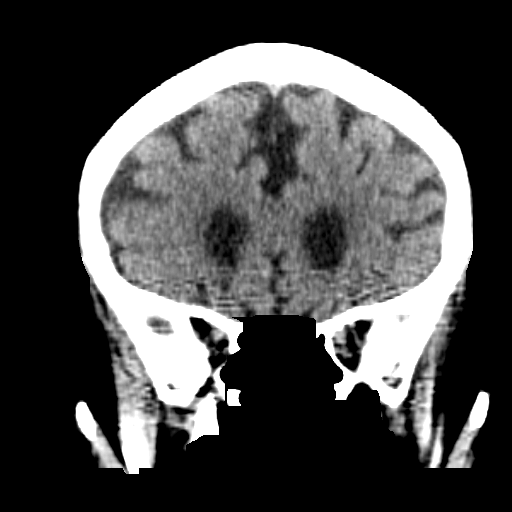
[im 33/73  brain]
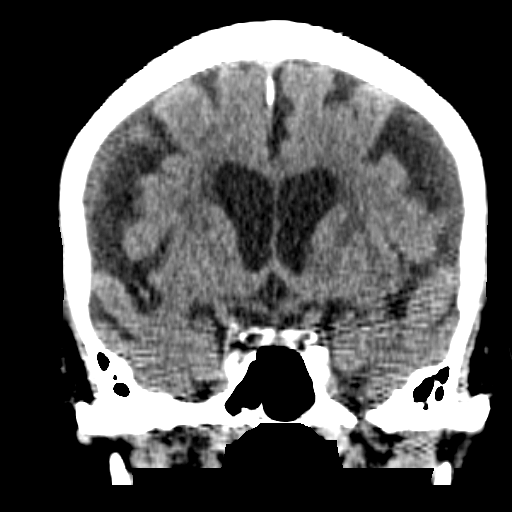
[im 41/73  brain]
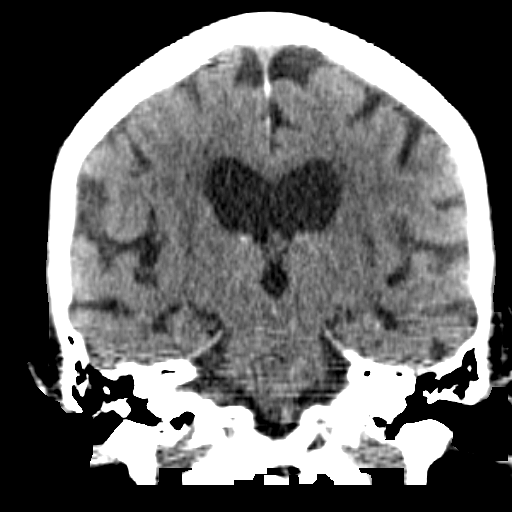

[Series 5: sagittal · sagittal · 0.38mm/px · 3 of 50 slices shown]
[im 17/50  brain]
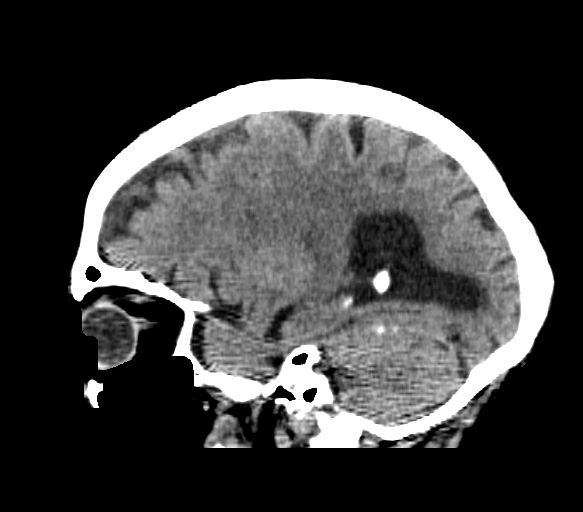
[im 25/50  brain]
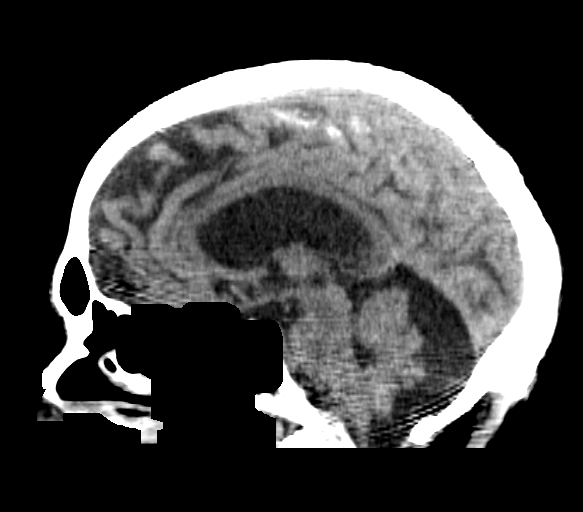
[im 33/50  brain]
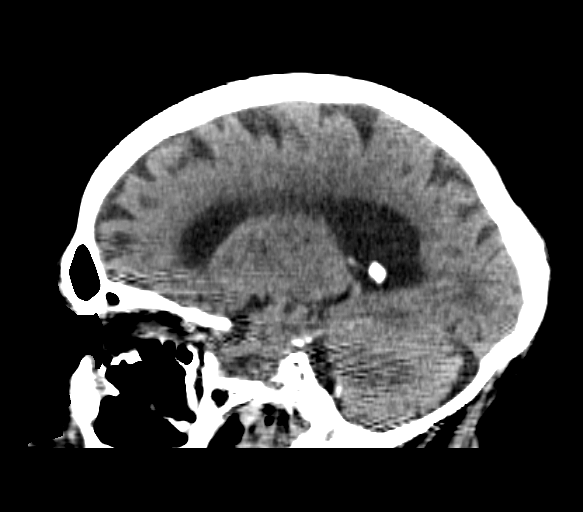

[17 of 47 positions shown; findings below may reference images not displayed]

FINDINGS: No intracranial hemorrhage, mass effect, or midline shift. Moderate
cerebral and cerebellar atrophy, unchanged. Mild chronic small
vessel ischemia. Remote right frontal lobe infarct with
encephalomalacia. No hydrocephalus. The basilar cisterns are patent.
No evidence of territorial infarct. No intracranial fluid
collection. Atherosclerosis of skullbase vasculature. Calvarium is
intact. Included paranasal sinuses and mastoid air cells are well
aerated.
IMPRESSION: 1.  No acute intracranial abnormality.
2. Stable atrophy and chronic ischemic change.

## 2016-10-20 IMAGING — DX DG CHEST 2V
2 series · 2 of 2 positions shown · non-contrast
Comparison: 11/09/2015

CLINICAL DATA: Shortness of breath.  Pain all over.

EXAM:
CHEST  2 VIEW

[chest lat]
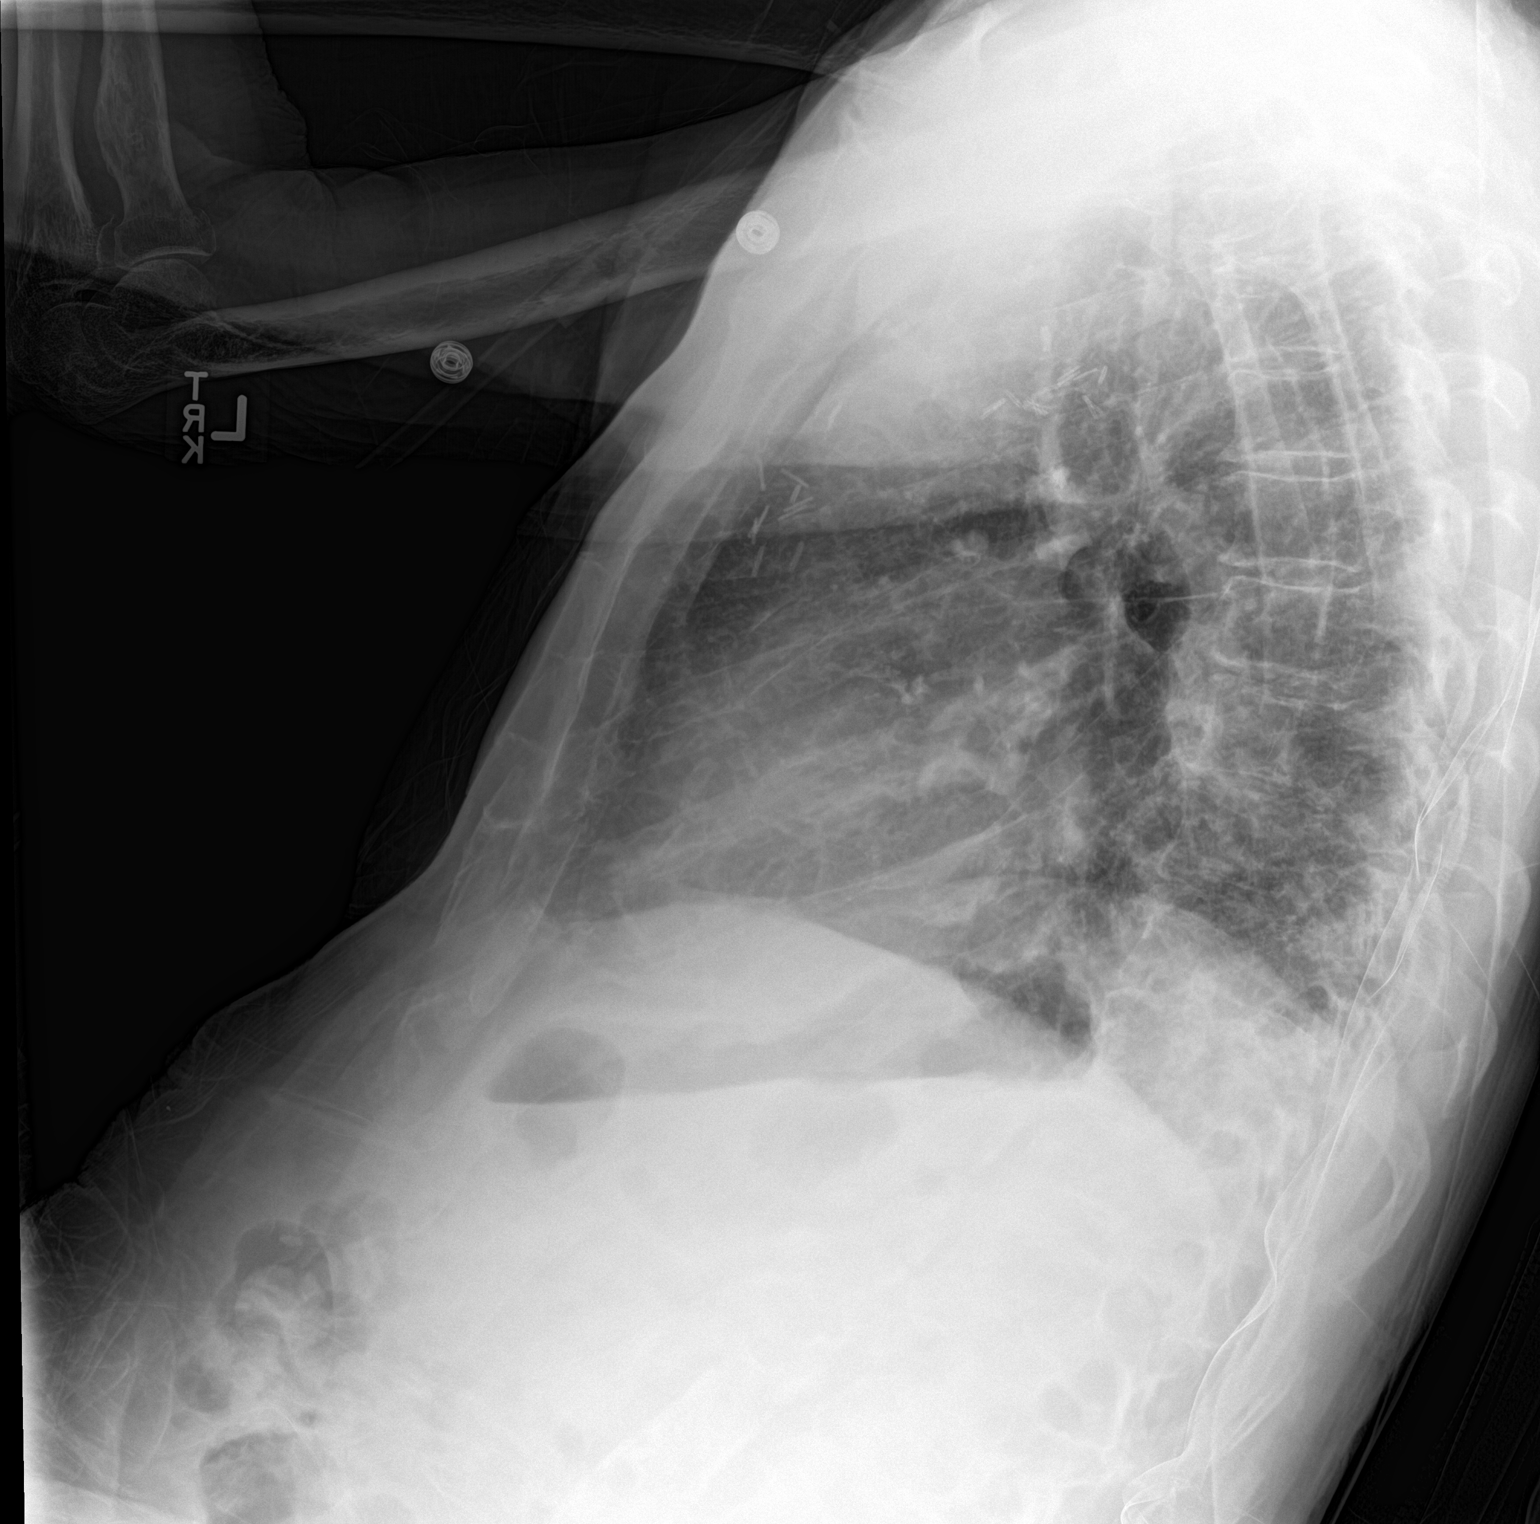

[chest ap]
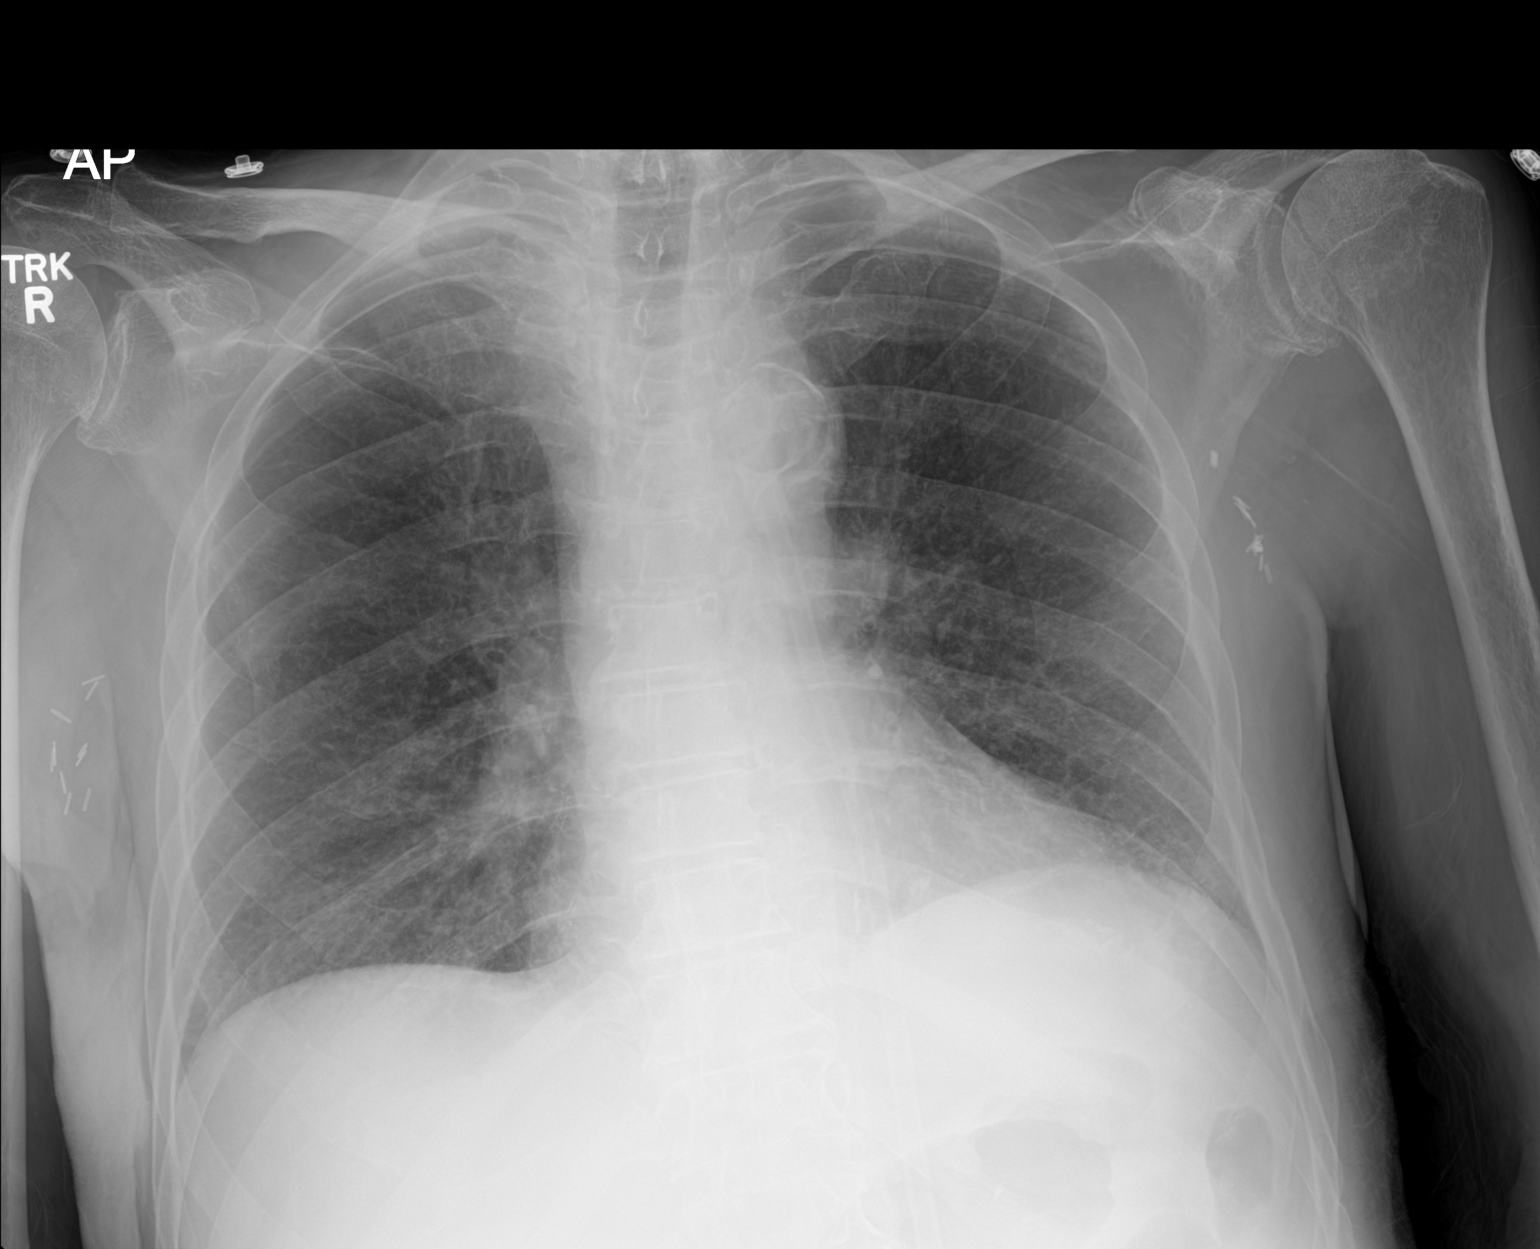

[2 of 2 positions shown; findings below may reference images not displayed]

FINDINGS: Surgical clips in the axilla bilaterally. Both lungs are clear.
Heart size is within normal limits. Atherosclerotic calcifications
at the aortic arch are unchanged. Surgical plate in the lower
cervical spine. No large pleural effusions. No acute bone
abnormality. Degenerative changes at the left glenohumeral joint.
IMPRESSION: No acute cardiopulmonary disease.
